# Patient Record
Sex: Male | Born: 1945 | Race: White | Hispanic: No | Smoking: Former smoker
Health system: Southern US, Community
[De-identification: ages and names within clinical notes are randomized; demographics above are authoritative.]

## PROBLEM LIST (undated history)

## (undated) DIAGNOSIS — F32A Depression, unspecified: Secondary | ICD-10-CM

## (undated) DIAGNOSIS — K219 Gastro-esophageal reflux disease without esophagitis: Secondary | ICD-10-CM

## (undated) DIAGNOSIS — G4730 Sleep apnea, unspecified: Secondary | ICD-10-CM

## (undated) DIAGNOSIS — C801 Malignant (primary) neoplasm, unspecified: Secondary | ICD-10-CM

## (undated) DIAGNOSIS — Z889 Allergy status to unspecified drugs, medicaments and biological substances status: Secondary | ICD-10-CM

## (undated) DIAGNOSIS — N189 Chronic kidney disease, unspecified: Secondary | ICD-10-CM

## (undated) DIAGNOSIS — E039 Hypothyroidism, unspecified: Secondary | ICD-10-CM

## (undated) DIAGNOSIS — E119 Type 2 diabetes mellitus without complications: Secondary | ICD-10-CM

## (undated) HISTORY — PX: OTHER SURGICAL HISTORY: SHX169

---

## 2002-02-07 DIAGNOSIS — I4891 Unspecified atrial fibrillation: Secondary | ICD-10-CM

## 2002-02-07 HISTORY — DX: Unspecified atrial fibrillation: I48.91

## 2006-12-06 DIAGNOSIS — K219 Gastro-esophageal reflux disease without esophagitis: Secondary | ICD-10-CM | POA: Diagnosis present

## 2010-07-20 DIAGNOSIS — I10 Essential (primary) hypertension: Secondary | ICD-10-CM

## 2010-07-20 DIAGNOSIS — I4819 Other persistent atrial fibrillation: Secondary | ICD-10-CM | POA: Diagnosis present

## 2010-07-20 DIAGNOSIS — E1122 Type 2 diabetes mellitus with diabetic chronic kidney disease: Secondary | ICD-10-CM | POA: Diagnosis present

## 2010-07-20 HISTORY — DX: Essential (primary) hypertension: I10

## 2010-08-17 DIAGNOSIS — E890 Postprocedural hypothyroidism: Secondary | ICD-10-CM | POA: Diagnosis present

## 2010-10-20 DIAGNOSIS — N400 Enlarged prostate without lower urinary tract symptoms: Secondary | ICD-10-CM | POA: Insufficient documentation

## 2015-12-10 DIAGNOSIS — G4733 Obstructive sleep apnea (adult) (pediatric): Secondary | ICD-10-CM

## 2016-02-09 ENCOUNTER — Other Ambulatory Visit: Payer: Self-pay | Admitting: Urology

## 2016-02-09 DIAGNOSIS — N2889 Other specified disorders of kidney and ureter: Secondary | ICD-10-CM

## 2016-02-11 ENCOUNTER — Ambulatory Visit
Admission: RE | Admit: 2016-02-11 | Discharge: 2016-02-11 | Disposition: A | Discharge status: Home / Self Care | Payer: Medicare Other | Source: Ambulatory Visit | Attending: Urology | Admitting: Urology

## 2016-02-11 DIAGNOSIS — N2889 Other specified disorders of kidney and ureter: Secondary | ICD-10-CM

## 2016-02-11 HISTORY — PX: IR GENERIC HISTORICAL: IMG1180011

## 2016-02-11 NOTE — Consult Note (Addendum)
Chief Complaint: I have a kidney mass.   Referring Physician(s): Dr. Rosario Adie [facility] [phone]  PCP: Dr. Toney Reil, [place], [place], with [facility].   History of Present Illness: Patrick Rocha is a 71 y.o. male presenting today, kindly referred by Dr. Venia Minks of the urology service [facility] regional physicians, for evaluation of cryoablation for left-sided renal tumor.  Patrick Rocha tells me that the tumor was first identified on a CT in 2015, which was performed by his physician for other reasons. He has not had any flank pain or urology symptoms related to the tumor at this time. Interval CT 10/27/2015 again showed similar configuration, and an MRI was then performed at the request of Dr. Venia Minks for better characterization.  MRI performed 02/02/2016 in fact shows 2 lesions which are compatible with renal cell carcinoma. There is a lower pole mass measuring 2.8 cm compatible with RCC. 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  He presents today for discussion, having seen Dr. Venia Minks last in the clinic 01/12/2016. He tells me their discussion involve several options, including partial nephrectomy, biopsy, and also the possibility of our cryoablation as a treatment.  I had a lengthy discussion regarding renal cell carcinoma and staging, as well as the associated treatment options. Each of the small lesions, superior less than 2 cm, inferior less than 3 cm represent stage T1a.  Given the appearance on the MRI, I think treatment is reasonable and biopsy unnecessary. We discussed treating these at a single session, with 2 separate treatment location with CT guidance. This can happen at [facility], I talked about logistics which would include general anesthesia and overnight stay for observation, with typical discharge the  following day. He will need to hold his Xarelto blood thinner for 3 days leading up to the procedure. Regarding risks, I specifically discussed: Bleeding, infection, injury to local region. Injury to adjacent organ such as colon, pancreas, bowel, injury to the abdominal wall with possible nerve injury, injury to collecting system, loss of kidney function including the entire kidney, need for further surgery/procedure, cryo-shock, cardiopulmonary collapse, death.   After our discussion, he would like to proceed with the treatment.  Medical history:  COPD, diabetes, atrial fibrillation/arrhythmia, hyperlipidemia, hypertension, history of kidney stones, sleep apnea   No past surgical history on file.  Cardiac electrophysiology study with ablation for arrhythmia Nasal sinus surgery, tonsillectomy  Allergies: Patient has no known allergies.  Medications: Prior to Admission medications   Not on File    Triamcinolone cream 0.1% Flomax 0.4 mg daily Xarelto 20 mg daily with dinner Protonic 40 mg 2 times daily Omega 3 fatty acids 1000 mg 3 times daily Metformin 1000 mg twice daily with meals Levothyroxin 150 g before breakfast Lasix 20 mg daily Allegra 180 mg 2 times daily Ferrous sulfate 325 mg 2 times daily with meal TriCor 145 mg daily Lexapro 10 mg daily Diltiazem 180 mg 2 times daily Cinnamon capsule 500 mg twice daily Carvedilol 3.125 mg 2 times daily with meal Lipitor 40 mg daily Lotensin 40 mg daily Aspirin 325 mg tablet daily Glipizide  No family history on file.  Social History   Social History  . Marital status: Widowed    Spouse name: N/A  . Number of children: N/A  . Years of education: N/A   Social History Main Topics  . Smoking status:  Not on file  . Smokeless tobacco: Not on file  . Alcohol use Not on file  . Drug use: Unknown  . Sexual activity: Not on file   Other Topics Concern  . Not on file   Social History Narrative  . No narrative on file     ECOG Status: 0 - Asymptomatic  Review of Systems: A 12 point ROS discussed and pertinent positives are indicated in the HPI above.  All other systems are negative.  Review of Systems  Vital Signs: BP 129/64 (BP Location: Left Arm, Patient Position: Sitting, Cuff Size: Large)   Pulse 62   Temp 98.4 F (36.9 C) (Oral)   Resp 15   Ht 5\' 8"  (1.727 m)   Wt 258 lb (117 kg)   SpO2 97%   BMI 39.23 kg/m   Physical Exam  Atraumatic, normocephalic, mucous membrane moist pink. Conjugate gaze. No glasses. No scleral icterus or scleral injection. Moving all 4 extremities equally and grossly with gross motor or gross sensory intact. Alert and oriented to person place and time. Appropriate affect and insightful questions. Symmetric excursion of the chest on inspiration and expiration. No labored breathing. Obese abdomen nontender. Genitourinary deferred. No jaundice with no obvious rash or open wounds.   Mallampati Score:  2  Imaging: No results found.  Labs:  CBC: No results for input(s): WBC, HGB, HCT, PLT in the last 8760 hours.  COAGS: No results for input(s): INR, APTT in the last 8760 hours.  BMP: No results for input(s): NA, K, CL, CO2, GLUCOSE, BUN, CALCIUM, CREATININE, GFRNONAA, GFRAA in the last 8760 hours.  Invalid input(s): CMP  LIVER FUNCTION TESTS: No results for input(s): BILITOT, AST, ALT, ALKPHOS, PROT, ALBUMIN in the last 8760 hours.  TUMOR MARKERS: No results for input(s): AFPTM, CEA, CA199, CHROMGRNA in the last 8760 hours.  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    I think he is a good candidate for CT-guided cryoablation, which we can perform at [facility] at a single session.  I think the excellent imaging findings on recent MRI obviate the need for a preprocedure biopsy, particularly given that he takes Xarelto.  After our discussion  about risks and benefits and potential treatment options, including observation and nephron sparing surgery, he would like to proceed with CT-guided cryoablation. He prefers to have this treated before an upcoming trip in March  We will schedule  him for CT-guided cryoablation at [facility] with general anesthesia. He will need to hold his Xarelto 3 days before treatment.    ADDENDUM: 4:56pm  I had a chance to speak to Dr. Venia Minks about the care of Patrick Rocha.  I agree that it would be best if Patrick. Rocha returns for follow-up appointment with his Urologist to complete a discussion about possible treatment options.  For clarification, I agree with Dr. Venia Minks that a simultaneous biopsy of each lesion is best at the time of any percutaneous ablation, should that be the treatment of choice.  Thank you for this interesting consult.  I greatly enjoyed meeting Patrick Rocha and look forward to participating in their care.  A copy of this report was sent to the requesting provider on this date.  Electronically Signed: Corrie Mckusick 02/11/2016, 3:31 PM   I spent a total of  40 Minutes   in face to face in clinical consultation, greater than 50%  of which was counseling/coordinating care for  left-sided multifocal renal cell carcinoma, possible cryoablation.

## 2016-02-12 ENCOUNTER — Encounter: Payer: Self-pay | Admitting: Interventional Radiology

## 2016-03-18 ENCOUNTER — Other Ambulatory Visit: Payer: Self-pay | Admitting: Radiology

## 2016-03-18 DIAGNOSIS — N2889 Other specified disorders of kidney and ureter: Secondary | ICD-10-CM

## 2016-04-12 ENCOUNTER — Ambulatory Visit
Admission: RE | Admit: 2016-04-12 | Discharge: 2016-04-12 | Disposition: A | Discharge status: Home / Self Care | Payer: Medicare Other | Source: Ambulatory Visit | Attending: Radiology | Admitting: Radiology

## 2016-04-12 DIAGNOSIS — N2889 Other specified disorders of kidney and ureter: Secondary | ICD-10-CM

## 2016-04-12 HISTORY — PX: IR RADIOLOGIST EVAL & MGMT: IMG5224

## 2016-04-12 NOTE — Progress Notes (Signed)
Chief Complaint: Left RCC  Referring Physician(s): Dr. Rosario Adie [facility] [phone]  PCP: Dr. Toney Reil, [place], [place], with [facility].   History of Present Illness: Patrick Rocha is a 71 y.o. male presenting today as a scheduled follow up, SP CT guided cryoablation of left sided renal tumors (2 separate tumors), both with imaging characteristics compatible with RCC.   CT guided cryoablation of both lesions was performed 03/17/2016 at [facility].  Intra-operative biopsy confirmed RCC at both sites that were treated.   He was admitted one night for observation and was discharged home on 03/18/2016.    He has recovered well.  He denies any hematuria, pain, fever, rigors, or chills.  He states that he has some bruising and soreness but that has resolved.    No past medical history on file.  Past Surgical History:  Procedure Laterality Date  . IR GENERIC HISTORICAL  02/11/2016   IR RADIOLOGIST EVAL & MGMT 02/11/2016 Corrie Mckusick, DO [facility] INTERV RAD    Allergies: Patient has no known allergies.  Medications: Prior to Admission medications   Medication Sig Start Date End Date Taking? Authorizing Provider  aspirin 325 MG tablet Take 325 mg by mouth daily.   Yes Historical Provider, MD  atorvastatin (LIPITOR) 40 MG tablet Take 40 mg by mouth daily.   Yes Historical Provider, MD  benazepril (LOTENSIN) 40 MG tablet Take 20 mg by mouth daily.    Yes Historical Provider, MD  carvedilol (COREG) 3.125 MG tablet Take 3.125 mg by mouth 2 (two) times daily with a meal.   Yes Historical Provider, MD  Cinnamon 500 MG capsule Take 500 mg by mouth daily. Takes 2 tablet po daily   Yes Historical Provider, MD  diltiazem (DILTIAZEM CD) 180 MG 24 hr capsule Take 180 mg by mouth 2 (two) times daily.   Yes Historical Provider, MD  escitalopram (LEXAPRO) 10 MG tablet Take 10 mg by mouth daily.   Yes Historical Provider, MD    fenofibrate (TRICOR) 145 MG tablet Take 145 mg by mouth daily.   Yes Historical Provider, MD  ferrous sulfate 325 (65 FE) MG tablet Take 325 mg by mouth 2 (two) times daily with a meal.   Yes Historical Provider, MD  fexofenadine (ALLEGRA) 180 MG tablet Take 180 mg by mouth 2 (two) times daily.   Yes Historical Provider, MD  furosemide (LASIX) 20 MG tablet Take 20 mg by mouth daily.   Yes Historical Provider, MD  levothyroxine (SYNTHROID, LEVOTHROID) 150 MCG tablet Take 150 mcg by mouth daily before breakfast.   Yes Historical Provider, MD  metFORMIN (GLUCOPHAGE) 1000 MG tablet Take 1,000 mg by mouth 2 (two) times daily with a meal.   Yes Historical Provider, MD  Omega-3 Fatty Acids (FISH OIL) 1000 MG CAPS Take 1,000 mg by mouth 3 (three) times daily.   Yes Historical Provider, MD  pantoprazole (PROTONIX) 40 MG tablet Take 40 mg by mouth 2 (two) times daily.   Yes Historical Provider, MD  pioglitazone (ACTOS) 15 MG tablet Take 15 mg by mouth daily.   Yes Historical Provider, MD  rivaroxaban (XARELTO) 20 MG TABS tablet Take 20 mg by mouth daily with supper.   Yes Historical Provider, MD  tamsulosin (FLOMAX) 0.4 MG CAPS capsule Take 0.4 mg by mouth daily.   Yes Historical Provider, MD  triamcinolone cream (KENALOG) 0.1 % Apply 1 application topically daily.   Yes Historical Provider, MD  No family history on file.  Social History   Social History  . Marital status: Widowed    Spouse name: N/A  . Number of children: N/A  . Years of education: N/A   Social History Main Topics  . Smoking status: Not on file  . Smokeless tobacco: Not on file  . Alcohol use Not on file  . Drug use: Unknown  . Sexual activity: Not on file   Other Topics Concern  . Not on file   Social History Narrative  . No narrative on file     Review of Systems: A 12 point ROS discussed and pertinent positives are indicated in the HPI above.  All other systems are negative.  Review of Systems  Vital  Signs: BP (!) 139/59 (BP Location: Left Arm, Patient Position: Sitting, Cuff Size: Large)   Pulse (!) 54   Temp 98.1 F (36.7 C) (Oral)   Resp 16   Ht 5\' 9"  (1.753 m)   Wt 260 lb (117.9 kg)   SpO2 98%   BMI 38.40 kg/m   Physical Exam  Targeted physical exam shows well healed incisions sites at the site of probe placement.   No evidence of infection.   Mallampati Score:     Imaging: No results found.  Labs:  CBC: No results for input(s): WBC, HGB, HCT, PLT in the last 8760 hours.  COAGS: No results for input(s): INR, APTT in the last 8760 hours.  BMP: No results for input(s): NA, K, CL, CO2, GLUCOSE, BUN, CALCIUM, CREATININE, GFRNONAA, GFRAA in the last 8760 hours.  Invalid input(s): CMP  LIVER FUNCTION TESTS: No results for input(s): BILITOT, AST, ALT, ALKPHOS, PROT, ALBUMIN in the last 8760 hours.  TUMOR MARKERS: No results for input(s): AFPTM, CEA, CA199, CHROMGRNA in the last 8760 hours.  Assessment and Plan:  Patrick Rocha is 71 year old male status post CT guided cryoablation of 2 separate renal tumors, both of which were confirmed RCC on intra-operative biopsy.    He is doing well 1 month after the procedure, with no concerns.    We discussed our follow up plan, which includes C+ CT of abd/pelvis 3 months after the procedure, which is approximately 06/14/2016, and also continued urology care.    Plan: - Contrast CT 3 months after the ablation. - observe all of his follow up appointments with Urology  Electronically Signed: Corrie Mckusick 04/12/2016, 5:24 PM   I spent a total of    15 Minutes in face to face in clinical consultation, greater than 50% of which was counseling/coordinating care for CT guided cryoablation of 2 RCC left kidney.

## 2016-07-14 ENCOUNTER — Other Ambulatory Visit: Payer: Self-pay | Admitting: *Deleted

## 2016-07-18 ENCOUNTER — Other Ambulatory Visit: Payer: Self-pay | Admitting: *Deleted

## 2016-07-18 DIAGNOSIS — N2889 Other specified disorders of kidney and ureter: Secondary | ICD-10-CM

## 2016-07-19 ENCOUNTER — Other Ambulatory Visit: Payer: Self-pay | Admitting: *Deleted

## 2016-07-19 ENCOUNTER — Other Ambulatory Visit: Payer: Self-pay | Admitting: Interventional Radiology

## 2016-07-19 DIAGNOSIS — N2889 Other specified disorders of kidney and ureter: Secondary | ICD-10-CM

## 2016-07-25 ENCOUNTER — Encounter: Payer: Self-pay | Admitting: Interventional Radiology

## 2016-08-01 ENCOUNTER — Ambulatory Visit (HOSPITAL_COMMUNITY)
Admission: RE | Admit: 2016-08-01 | Discharge: 2016-08-01 | Disposition: A | Discharge status: Home / Self Care | Payer: Medicare Other | Source: Ambulatory Visit | Attending: Interventional Radiology | Admitting: Interventional Radiology

## 2016-08-01 ENCOUNTER — Other Ambulatory Visit: Payer: Medicare Other

## 2016-08-01 ENCOUNTER — Encounter (HOSPITAL_COMMUNITY): Payer: Self-pay

## 2016-08-01 DIAGNOSIS — Z9889 Other specified postprocedural states: Secondary | ICD-10-CM | POA: Diagnosis not present

## 2016-08-01 DIAGNOSIS — K8020 Calculus of gallbladder without cholecystitis without obstruction: Secondary | ICD-10-CM | POA: Insufficient documentation

## 2016-08-01 DIAGNOSIS — I2510 Atherosclerotic heart disease of native coronary artery without angina pectoris: Secondary | ICD-10-CM | POA: Diagnosis not present

## 2016-08-01 DIAGNOSIS — K760 Fatty (change of) liver, not elsewhere classified: Secondary | ICD-10-CM | POA: Diagnosis not present

## 2016-08-01 DIAGNOSIS — N2889 Other specified disorders of kidney and ureter: Secondary | ICD-10-CM | POA: Insufficient documentation

## 2016-08-01 DIAGNOSIS — I700 Atherosclerosis of aorta: Secondary | ICD-10-CM | POA: Diagnosis not present

## 2016-08-01 LAB — POCT I-STAT CREATININE: Creatinine, Ser: 1.6 mg/dL — ABNORMAL HIGH (ref 0.61–1.24)

## 2016-08-01 MED ORDER — IOPAMIDOL (ISOVUE-300) INJECTION 61%
100.0000 mL | Freq: Once | INTRAVENOUS | Status: AC | PRN
  Administered 2016-08-01: 80 mL via INTRAVENOUS

## 2016-08-01 MED ORDER — IOPAMIDOL (ISOVUE-300) INJECTION 61%
INTRAVENOUS | Status: AC
  Filled 2016-08-01: qty 100

## 2016-08-03 ENCOUNTER — Ambulatory Visit
Admission: RE | Admit: 2016-08-03 | Discharge: 2016-08-03 | Disposition: A | Discharge status: Home / Self Care | Payer: Medicare Other | Source: Ambulatory Visit | Attending: Interventional Radiology | Admitting: Interventional Radiology

## 2016-08-03 ENCOUNTER — Other Ambulatory Visit (HOSPITAL_COMMUNITY): Payer: Medicare Other

## 2016-08-03 DIAGNOSIS — N2889 Other specified disorders of kidney and ureter: Secondary | ICD-10-CM

## 2016-08-03 NOTE — Progress Notes (Signed)
Chief Complaint: Left RCC, status post treatment  Referring Physician(s): Dr. Rosario Adie [facility] [phone]   PCP: Dr. Toney Reil, [place], [place], with [facility].  History of Present Illness: Patrick Rocha is a 71 y.o. male presenting today as a scheduled follow up visit, status post CT guided cryoablation of 2 left-sided RCC 03/17/2016 at [facility].  Intraoperative biopsy performed confirmed RCC at both sites.   He has done well in recovery.  He denies any pain, urinary symptoms, fever.  He has returned to all of his usual activities.    We had a quick visit today in order to go over his imaging results, and to discuss our plan in the next year.    The contrast CT performed 08/01/2016 shows typical changes after therapy, with no concern for recurrence/progression.    He tells me that he will be establishing new Urology care, and he continues to see Dr. Kirk Ruths.  He is relocating to [place].    No past medical history on file.  Past Surgical History:  Procedure Laterality Date  . IR GENERIC HISTORICAL  02/11/2016   IR RADIOLOGIST EVAL & MGMT 02/11/2016 Corrie Mckusick, DO [facility] INTERV RAD  . IR RADIOLOGIST EVAL & MGMT  04/12/2016    Allergies: Patient has no known allergies.  Medications: Prior to Admission medications   Medication Sig Start Date End Date Taking? Authorizing Provider  aspirin 325 MG tablet Take 325 mg by mouth daily.   Yes [provider]  atorvastatin (LIPITOR) 40 MG tablet Take 40 mg by mouth daily.   Yes [provider]  benazepril (LOTENSIN) 40 MG tablet Take 20 mg by mouth daily.    Yes [provider]  carvedilol (COREG) 3.125 MG tablet Take 3.125 mg by mouth 2 (two) times daily with a meal.   Yes [provider]  Cinnamon 500 MG capsule Take 500 mg by mouth daily. Takes 2 tablet po daily   Yes [provider]  diltiazem (DILTIAZEM CD) 180 MG 24  hr capsule Take 180 mg by mouth 2 (two) times daily.   Yes [provider]  escitalopram (LEXAPRO) 10 MG tablet Take 10 mg by mouth daily.   Yes [provider]  fenofibrate (TRICOR) 145 MG tablet Take 145 mg by mouth daily.   Yes [provider]  ferrous sulfate 325 (65 FE) MG tablet Take 325 mg by mouth 2 (two) times daily with a meal.   Yes [provider]  fexofenadine (ALLEGRA) 180 MG tablet Take 180 mg by mouth 2 (two) times daily.   Yes [provider]  furosemide (LASIX) 20 MG tablet Take 20 mg by mouth daily.   Yes [provider]  levothyroxine (SYNTHROID, LEVOTHROID) 150 MCG tablet Take 150 mcg by mouth daily before breakfast.   Yes [provider]  metFORMIN (GLUCOPHAGE) 1000 MG tablet Take 1,000 mg by mouth 2 (two) times daily with a meal.   Yes [provider]  Omega-3 Fatty Acids (FISH OIL) 1000 MG CAPS Take 1,000 mg by mouth 3 (three) times daily.   Yes [provider]  pantoprazole (PROTONIX) 40 MG tablet Take 40 mg by mouth 2 (two) times daily.   Yes [provider]  pioglitazone (ACTOS) 15 MG tablet Take 15 mg by mouth daily.   Yes [provider]  rivaroxaban (XARELTO) 20 MG TABS tablet Take 20 mg by mouth daily with supper.   Yes [provider]  tamsulosin (FLOMAX) 0.4 MG CAPS capsule Take 0.4 mg by mouth daily.   Yes [provider]  triamcinolone cream (KENALOG) 0.1 % Apply 1 application topically daily.   Yes [provider]     No family history on file.  Social History   Social History  . Marital status: Widowed    Spouse name: N/A  . Number of children: N/A  . Years of education: N/A   Social History Main Topics  . Smoking status: Not on file  . Smokeless tobacco: Not on file  . Alcohol use Not on file  . Drug use: Unknown  . Sexual activity: Not on file   Other Topics Concern  . Not on file   Social History Narrative  . No  narrative on file    Review of Systems: A 12 point ROS discussed and pertinent positives are indicated in the HPI above.  All other systems are negative.  Review of Systems  Vital Signs: BP (!) 153/72   Pulse (!) 57   Temp 97.5 F (36.4 C) (Oral)   Resp 16   Ht 5\' 9"  (1.753 m)   Wt 260 lb (117.9 kg)   SpO2 98%   BMI 38.40 kg/m   Physical Exam  Mallampati Score:     Imaging: Ct Abdomen Pelvis W Wo Contrast  Result Date: 08/01/2016 CLINICAL DATA:  71 year old male with history of left renal cancer status post cryoablation 6 months ago. Followup study. EXAM: CT ABDOMEN AND PELVIS WITHOUT AND WITH CONTRAST TECHNIQUE: Multidetector CT imaging of the abdomen and pelvis was performed following the standard protocol before and following the bolus administration of intravenous contrast. CONTRAST:  33mL ISOVUE-300 IOPAMIDOL (ISOVUE-300) INJECTION 61% COMPARISON:  No priors. FINDINGS: Lower chest: Atherosclerotic calcification in the left anterior descending coronary artery. Hepatobiliary: Mild diffuse low attenuation throughout the hepatic parenchyma, compatible with hepatic steatosis. No cystic or solid hepatic lesions. No intra or extrahepatic biliary ductal dilatation. Tiny calcified gallstones lying dependently in the neck of the gallbladder. No findings to suggest an acute cholecystitis at this time. Pancreas: No pancreatic mass. No pancreatic ductal dilatation. No pancreatic or peripancreatic fluid or inflammatory changes. Spleen: Subcentimeter low-attenuation lesion in the posterior aspect of the spleen is incompletely characterized, but has a generally benign appearance. Adrenals/Urinary Tract: In the lateral aspect of the interpolar region and lateral aspect of the upper pole of the left kidney there are 2 areas which have an appearance compatible with prior cryoablation. Both of these lesions have a central soft tissue nidus, which does not demonstrate arterial phase hyperenhancement,  surrounded by fatty attenuation within a thin surrounding soft tissue attenuation margin. Today's study will serve as a baseline for future followup examinations. There multiple other renal lesions bilaterally, most of which are subcentimeter in size and too small to definitively characterize. Simple cysts are present measuring up to 1.7 cm in the interpolar region of the right kidney. Additionally, in the medial aspect of the interpolar region of the right kidney there is a partially exophytic 1.3 cm lesion which is high attenuation and does not enhance (46 HU on precontrast images, 48 HU on arterial phase images, 51 HU on portal venous phase and 50 HU on delayed phase images), compatible with a proteinaceous/hemorrhagic cyst. Bilateral adrenal glands are normal in appearance. No hydroureteronephrosis. Urinary bladder is normal in appearance. Stomach/Bowel: Normal appearance of the stomach. No pathologic dilatation of small bowel or colon. Normal appendix. Vascular/Lymphatic: Aortic atherosclerosis, without evidence of aneurysm  or dissection in the abdominal or pelvic vasculature. Single renal arteries bilaterally are widely patent. Both renal veins are patent. No lymphadenopathy noted in the abdomen or pelvis. Reproductive: Prostate gland seminal vesicles are unremarkable in appearance. Other: No significant volume of ascites.  No pneumoperitoneum. Musculoskeletal: There are no aggressive appearing lytic or blastic lesions noted in the visualized portions of the skeleton. IMPRESSION: 1. Expected postprocedural changes of cryoablation to the right kidney, without definite evidence to suggest residual/recurrent disease or metastatic disease in the abdomen or pelvis. 2. Hepatic steatosis. 3. Aortic atherosclerosis, in addition to at least left anterior descending coronary artery disease. Please note that although the presence of coronary artery calcium documents the presence of coronary artery disease, the severity  of this disease and any potential stenosis cannot be assessed on this non-gated CT examination. Assessment for potential risk factor modification, dietary therapy or pharmacologic therapy may be warranted, if clinically indicated. 4. Cholelithiasis without evidence of acute cholecystitis at this time. Electronically Signed   By: Vinnie Langton M.D.   On: 08/01/2016 10:33    Labs:  CBC: No results for input(s): WBC, HGB, HCT, PLT in the last 8760 hours.  COAGS: No results for input(s): INR, APTT in the last 8760 hours.  BMP:  Recent Labs  08/01/16 0847  CREATININE 1.60*    LIVER FUNCTION TESTS: No results for input(s): BILITOT, AST, ALT, ALKPHOS, PROT, ALBUMIN in the last 8760 hours.  TUMOR MARKERS: No results for input(s): AFPTM, CEA, CA199, CHROMGRNA in the last 8760 hours.  Assessment and Plan:  Patrick Rocha is 71 year old male status post CT guided cryoablation of 2 separate renal tumors, both of which were confirmed RCC on intra-operative biopsy.    He has returned to full activities now, 4 months after the procedure, with no concerns.    We discussed our follow up plan, which includes surveillance contrast CT of abd/pelvis several times during the first year after the procedure, typically every 3-4 months.  We anticipate his next appointment and CT in October.   Plan: - Contrast CT and office visit 4 months from now, approximately October.  - observe all of his follow up appointments with his physicians.      Electronically Signed: Corrie Mckusick 08/03/2016, 1:28 PM   I spent a total of    25 Minutes in face to face in clinical consultation, greater than 50% of which was counseling/coordinating care for left sided RCC, status post cryoablation

## 2016-10-31 ENCOUNTER — Ambulatory Visit (HOSPITAL_COMMUNITY): Payer: Medicare Other

## 2016-10-31 ENCOUNTER — Other Ambulatory Visit: Payer: Self-pay | Admitting: Interventional Radiology

## 2016-10-31 DIAGNOSIS — N2889 Other specified disorders of kidney and ureter: Secondary | ICD-10-CM

## 2016-10-31 DIAGNOSIS — C642 Malignant neoplasm of left kidney, except renal pelvis: Secondary | ICD-10-CM

## 2016-11-01 ENCOUNTER — Other Ambulatory Visit: Payer: Self-pay | Admitting: Interventional Radiology

## 2016-11-01 DIAGNOSIS — N2889 Other specified disorders of kidney and ureter: Secondary | ICD-10-CM

## 2016-12-07 ENCOUNTER — Ambulatory Visit
Admission: RE | Admit: 2016-12-07 | Discharge: 2016-12-07 | Disposition: A | Discharge status: Home / Self Care | Payer: Medicare Other | Source: Ambulatory Visit | Attending: Interventional Radiology | Admitting: Interventional Radiology

## 2016-12-07 ENCOUNTER — Ambulatory Visit (HOSPITAL_COMMUNITY)
Admission: RE | Admit: 2016-12-07 | Discharge: 2016-12-07 | Disposition: A | Discharge status: Home / Self Care | Payer: Medicare Other | Source: Ambulatory Visit | Attending: Interventional Radiology | Admitting: Interventional Radiology

## 2016-12-07 DIAGNOSIS — C642 Malignant neoplasm of left kidney, except renal pelvis: Secondary | ICD-10-CM | POA: Insufficient documentation

## 2016-12-07 DIAGNOSIS — N2889 Other specified disorders of kidney and ureter: Secondary | ICD-10-CM

## 2016-12-07 DIAGNOSIS — K4090 Unilateral inguinal hernia, without obstruction or gangrene, not specified as recurrent: Secondary | ICD-10-CM | POA: Diagnosis not present

## 2016-12-07 DIAGNOSIS — K8020 Calculus of gallbladder without cholecystitis without obstruction: Secondary | ICD-10-CM | POA: Insufficient documentation

## 2016-12-07 DIAGNOSIS — I700 Atherosclerosis of aorta: Secondary | ICD-10-CM | POA: Diagnosis not present

## 2016-12-07 HISTORY — PX: IR RADIOLOGIST EVAL & MGMT: IMG5224

## 2016-12-07 LAB — POCT I-STAT CREATININE: CREATININE: 1.6 mg/dL — AB (ref 0.61–1.24)

## 2016-12-07 MED ORDER — IOPAMIDOL (ISOVUE-300) INJECTION 61%
INTRAVENOUS | Status: AC
  Filled 2016-12-07: qty 100

## 2016-12-07 MED ORDER — IOPAMIDOL (ISOVUE-300) INJECTION 61%
80.0000 mL | Freq: Once | INTRAVENOUS | Status: AC | PRN
  Administered 2016-12-07: 80 mL via INTRAVENOUS

## 2016-12-07 NOTE — Progress Notes (Signed)
Chief Complaint: Renal Cell Cancer  Referring Physician(s): Lance Sell  Supervising Physician: Corrie Mckusick  History of Present Illness: Patrick Rocha is a 71 y.o. male who is here today for follow up.  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.    Intraoperative biopsy performed confirmed RCC at both sites.   CT performed 08/01/2016 shows typical changes after therapy, with no concern for recurrence/progression.    He feels well. He has no complaints.  No past medical history on file.  Past Surgical History:  Procedure Laterality Date  . IR GENERIC HISTORICAL  02/11/2016   IR RADIOLOGIST EVAL & MGMT 02/11/2016 Corrie Mckusick, DO [facility] INTERV RAD  . IR RADIOLOGIST EVAL & MGMT  04/12/2016    Allergies: Patient has no known allergies.  Medications: Prior to Admission medications   Medication Sig Start Date End Date Taking? Authorizing Provider  aspirin 325 MG tablet Take 325 mg by mouth daily.    [provider]  atorvastatin (LIPITOR) 40 MG tablet Take 40 mg by mouth daily.    [provider]  benazepril (LOTENSIN) 40 MG tablet Take 20 mg by mouth daily.     [provider]  carvedilol (COREG) 3.125 MG tablet Take 3.125 mg by mouth 2 (two) times daily with a meal.    [provider]  Cinnamon 500 MG capsule Take 500 mg by mouth daily. Takes 2 tablet po daily    [provider]  diltiazem (DILTIAZEM CD) 180 MG 24 hr capsule Take 180 mg by mouth 2 (two) times daily.    [provider]  escitalopram (LEXAPRO) 10 MG tablet Take 10 mg by mouth daily.    [provider]  fenofibrate (TRICOR) 145 MG tablet Take 145 mg by mouth daily.    [provider]  ferrous sulfate 325 (65 FE) MG tablet Take 325 mg by mouth 2 (two) times daily with a meal.    [provider]  fexofenadine (ALLEGRA) 180 MG tablet Take 180 mg by mouth 2 (two)  times daily.    [provider]  furosemide (LASIX) 20 MG tablet Take 20 mg by mouth daily.    [provider]  levothyroxine (SYNTHROID, LEVOTHROID) 150 MCG tablet Take 150 mcg by mouth daily before breakfast.    [provider]  metFORMIN (GLUCOPHAGE) 1000 MG tablet Take 1,000 mg by mouth 2 (two) times daily with a meal.    [provider]  Omega-3 Fatty Acids (FISH OIL) 1000 MG CAPS Take 1,000 mg by mouth 3 (three) times daily.    [provider]  pantoprazole (PROTONIX) 40 MG tablet Take 40 mg by mouth 2 (two) times daily.    [provider]  pioglitazone (ACTOS) 15 MG tablet Take 15 mg by mouth daily.    [provider]  rivaroxaban (XARELTO) 20 MG TABS tablet Take 20 mg by mouth daily with supper.    [provider]  tamsulosin (FLOMAX) 0.4 MG CAPS capsule Take 0.4 mg by mouth daily.    [provider]  triamcinolone cream (KENALOG) 0.1 % Apply 1 application topically daily.    [provider]     No family history on file.  Social History   Social History  . Marital status: Widowed    Spouse name: N/A  . Number of children: N/A  . Years of education: N/A   Social History Main Topics  .  Smoking status: Not on file  . Smokeless tobacco: Not on file  . Alcohol use Not on file  . Drug use: Unknown  . Sexual activity: Not on file   Other Topics Concern  . Not on file   Social History Narrative  . No narrative on file    Review of Systems: A 12 point ROS discussed Review of Systems  Constitutional: Negative.   HENT: Negative.   Respiratory: Negative.   Cardiovascular: Negative.   Gastrointestinal: Negative.   Genitourinary: Negative.   Musculoskeletal: Negative.   Skin: Negative.   Neurological: Negative.   Hematological: Negative.   Psychiatric/Behavioral: Negative.     Vital Signs: There were no vitals taken for this visit.  Physical Exam  Constitutional: He is oriented  to person, place, and time. He appears well-developed.  HENT:  Head: Normocephalic and atraumatic.  Eyes: EOM are normal.  Neck: Normal range of motion.  Cardiovascular: Normal rate, regular rhythm and normal heart sounds.   Pulmonary/Chest: Effort normal and breath sounds normal.  Abdominal: Soft. He exhibits no distension. There is no tenderness.  protuberant  Musculoskeletal: Normal range of motion.  Neurological: He is alert and oriented to person, place, and time.  Skin: Skin is warm and dry.  Psychiatric: He has a normal mood and affect. His behavior is normal. Judgment and thought content normal.    Imaging: No results found.  Labs:  CBC: No results for input(s): WBC, HGB, HCT, PLT in the last 8760 hours.  COAGS: No results for input(s): INR, APTT in the last 8760 hours.  BMP:  Recent Labs  08/01/16 0847 12/07/16 1212  CREATININE 1.60* 1.60*    LIVER FUNCTION TESTS: No results for input(s): BILITOT, AST, ALT, ALKPHOS, PROT, ALBUMIN in the last 8760 hours.  TUMOR MARKERS: No results for input(s): AFPTM, CEA, CA199, CHROMGRNA in the last 8760 hours.  Assessment:  Status post CT guided cryoablation of 2 separate renal tumors, both of which were confirmed RCC on intra-operative biopsy.  Creatinine remains stable at 1.6  Will obtain surveillance contrast CT of abd/pelvis in 3-4 months.  Electronically Signed: Murrell Redden PA-C 12/07/2016, 1:07 PM   Please refer to Dr. Pasty Arch attestation of this note for management and plan.

## 2016-12-28 ENCOUNTER — Encounter: Payer: Self-pay | Admitting: Interventional Radiology

## 2017-05-29 ENCOUNTER — Other Ambulatory Visit: Payer: Self-pay | Admitting: Radiology

## 2017-05-29 ENCOUNTER — Other Ambulatory Visit: Payer: Self-pay | Admitting: Interventional Radiology

## 2017-05-29 DIAGNOSIS — N2889 Other specified disorders of kidney and ureter: Secondary | ICD-10-CM

## 2017-06-06 ENCOUNTER — Ambulatory Visit
Admission: RE | Admit: 2017-06-06 | Discharge: 2017-06-06 | Disposition: A | Discharge status: Home / Self Care | Payer: Medicare Other | Source: Ambulatory Visit | Attending: Interventional Radiology | Admitting: Interventional Radiology

## 2017-06-06 ENCOUNTER — Encounter (HOSPITAL_COMMUNITY): Payer: Self-pay

## 2017-06-06 ENCOUNTER — Ambulatory Visit (HOSPITAL_COMMUNITY)
Admission: RE | Admit: 2017-06-06 | Discharge: 2017-06-06 | Disposition: A | Discharge status: Home / Self Care | Payer: Medicare Other | Source: Ambulatory Visit | Attending: Interventional Radiology | Admitting: Interventional Radiology

## 2017-06-06 DIAGNOSIS — I2510 Atherosclerotic heart disease of native coronary artery without angina pectoris: Secondary | ICD-10-CM | POA: Insufficient documentation

## 2017-06-06 DIAGNOSIS — N2889 Other specified disorders of kidney and ureter: Secondary | ICD-10-CM | POA: Diagnosis not present

## 2017-06-06 DIAGNOSIS — K8020 Calculus of gallbladder without cholecystitis without obstruction: Secondary | ICD-10-CM | POA: Insufficient documentation

## 2017-06-06 DIAGNOSIS — I700 Atherosclerosis of aorta: Secondary | ICD-10-CM | POA: Diagnosis not present

## 2017-06-06 HISTORY — DX: Type 2 diabetes mellitus without complications: E11.9

## 2017-06-06 HISTORY — DX: Malignant (primary) neoplasm, unspecified: C80.1

## 2017-06-06 HISTORY — PX: IR RADIOLOGIST EVAL & MGMT: IMG5224

## 2017-06-06 LAB — POCT I-STAT CREATININE: CREATININE: 1.4 mg/dL — AB (ref 0.61–1.24)

## 2017-06-06 MED ORDER — IOHEXOL 300 MG/ML  SOLN
100.0000 mL | Freq: Once | INTRAMUSCULAR | Status: AC | PRN
  Administered 2017-06-06: 100 mL via INTRAVENOUS

## 2017-06-06 NOTE — Progress Notes (Signed)
Chief Complaint: Left RCC  Referring Physician(s): PCP is Dr. Toney Reil  History of Present Illness: Patrick Rocha is a 72 y.o. male presenting as a scheduled follow up SP CT guided tissue ablation of 2 separate left RCC, pathology proven.   [name] was treated February 2018 with CT guided ablation of 2 left renal tumors.    He has done very well.  Today he denies any new symptoms, hematuria, flank pain.    Surveillance CT's have been performed through today.  Again, today's study shows treatment defects with no evidence of local recurrence.    Past Medical History:  Diagnosis Date  . Diabetes mellitus without complication ([place])   . renal ca    cryoablation left kidney 02/2016      Allergies: Patient has no known allergies.  Medications: Prior to Admission medications   Medication Sig Start Date End Date Taking? Authorizing Provider  aspirin 325 MG tablet Take 325 mg by mouth daily.   Yes [provider]  atorvastatin (LIPITOR) 40 MG tablet Take 40 mg by mouth daily.   Yes [provider]  benazepril (LOTENSIN) 40 MG tablet Take 20 mg by mouth daily.    Yes [provider]  carvedilol (COREG) 3.125 MG tablet Take 3.125 mg by mouth 2 (two) times daily with a meal.   Yes [provider]  Cinnamon 500 MG capsule Take 500 mg by mouth daily. Takes 2 tablet po daily   Yes [provider]  diltiazem (DILTIAZEM CD) 180 MG 24 hr capsule Take 180 mg by mouth 2 (two) times daily.   Yes [provider]  escitalopram (LEXAPRO) 10 MG tablet Take 10 mg by mouth daily.   Yes [provider]  fenofibrate (TRICOR) 145 MG tablet Take 145 mg by mouth daily.   Yes [provider]  ferrous sulfate 325 (65 FE) MG tablet Take 325 mg by mouth 2 (two) times daily with a meal.   Yes [provider]  fexofenadine (ALLEGRA) 180 MG tablet Take 180 mg by mouth 2 (two) times daily.   Yes [provider]  furosemide (LASIX) 20 MG tablet Take 20 mg by mouth daily.   Yes [provider]  glipiZIDE (GLUCOTROL) 5 MG tablet Take 5 mg by mouth daily before breakfast.   Yes [provider]  levothyroxine (SYNTHROID, LEVOTHROID) 150 MCG tablet Take 150 mcg by mouth daily before breakfast.   Yes [provider]  metFORMIN (GLUCOPHAGE) 1000 MG tablet Take 1,000 mg by mouth 2 (two) times daily with a meal.   Yes [provider]  Omega-3 Fatty Acids (FISH OIL) 1000 MG CAPS Take 1,000 mg by mouth 3 (three) times daily.   Yes [provider]  pantoprazole (PROTONIX) 40 MG tablet Take 40 mg by mouth 2 (two) times daily.   Yes [provider]  pioglitazone (ACTOS) 15 MG tablet Take 15 mg by mouth daily.   Yes [provider]  rivaroxaban (XARELTO) 20 MG TABS tablet Take 20 mg by mouth daily with supper.   Yes [provider]  tamsulosin (FLOMAX) 0.4 MG CAPS capsule Take 0.4 mg by mouth daily.   Yes [provider]  triamcinolone cream (KENALOG) 0.1 % Apply 1 application topically daily.   Yes [provider]     No family history on file.  Social History   Socioeconomic History  . Marital status: Widowed    Spouse name: Not on file  .  Number of children: Not on file  . Years of education: Not on file  . Highest education level: Not on file  Occupational History  . Not on file  Social Needs  . Financial resource strain: Not on file  . Food insecurity:    Worry: Not on file    Inability: Not on file  . Transportation needs:    Medical: Not on file    Non-medical: Not on file  Tobacco Use  . Smoking status: Not on file  Substance and Sexual Activity  . Alcohol use: Not on file  . Drug use: Not on file  . Sexual activity: Not on file  Lifestyle  . Physical activity:    Days per week: Not on file    Minutes per session: Not on file  . Stress: Not on file  Relationships  . Social  connections:    Talks on phone: Not on file    Gets together: Not on file    Attends religious service: Not on file    Active member of club or organization: Not on file    Attends meetings of clubs or organizations: Not on file    Relationship status: Not on file  Other Topics Concern  . Not on file  Social History Narrative  . Not on file    Review of Systems: A 12 point ROS discussed and pertinent positives are indicated in the HPI above.  All other systems are negative.  Review of Systems  Vital Signs: BP (!) 150/71   Pulse 64   Temp 98.1 F (36.7 C) (Oral)   Resp 16   Ht 5\' 10"  (1.778 m)   Wt 260 lb (117.9 kg)   SpO2 97%   BMI 37.31 kg/m   Physical Exam  Mallampati Score:     Imaging: Ct Abdomen W Wo Contrast  Result Date: 06/06/2017 CLINICAL DATA:  Follow-up two left renal masses status post cryoablation February 2018. EXAM: CT ABDOMEN WITHOUT AND WITH CONTRAST TECHNIQUE: Multidetector CT imaging of the abdomen was performed following the standard protocol before and following the bolus administration of intravenous contrast. CONTRAST:  145mL OMNIPAQUE IOHEXOL 300 MG/ML  SOLN COMPARISON:  12/07/2016 CT abdomen. FINDINGS: Lower chest: No significant pulmonary nodules or acute consolidative airspace disease. Coronary atherosclerosis. Hepatobiliary: Normal liver with no liver mass. Cholelithiasis. No biliary ductal dilatation. Pancreas: Normal, with no mass or duct dilation. Spleen: Normal size spleen. 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 No additional splenic lesions. Adrenals/Urinary Tract: Normal adrenals. No renal stones. No hydronephrosis. Exophytic 1.5 cm renal cortical lesion in the medial anterior right interpolar kidney (series 16/image 25) demonstrates precontrast hyperdensity with no  convincing enhancement, mildly increased from 1.2 cm on 12/07/2016 CT, compatible with a Bosniak category 2 hemorrhagic/proteinaceous renal cyst. Simple 2.2 cm partially exophytic renal cyst in the posterior upper right kidney. Several additional subcentimeter hypodense renal cortical lesions scattered in the right greater than left kidneys are too small to characterize and not appreciably changed. Stable ablation defect in the posterior lower left kidney without appreciable enhancement (series 16/image 31). Stable ablation defect in the lateral upper left kidney without appreciable enhancement (series 11/image 60). No new renal lesions. Stomach/Bowel: Normal non-distended stomach. Visualized small and large bowel is normal caliber, with no bowel wall thickening. Vascular/Lymphatic: Atherosclerotic nonaneurysmal abdominal aorta. Patent portal,  splenic, hepatic and renal veins. No pathologically enlarged lymph nodes in the abdomen. Other: No pneumoperitoneum, ascites or focal fluid collection. Musculoskeletal: No aggressive appearing focal osseous lesions. Moderate thoracolumbar spondylosis. IMPRESSION: 1. No evidence of local tumor recurrence at the 2 cryoablation sites in the left kidney. No evidence of metastatic disease in the abdomen. 2. Chronic findings include: Aortic Atherosclerosis (ICD10-I70.0). Coronary atherosclerosis. Cholelithiasis. Electronically Signed   By: Ilona Sorrel M.D.   On: 06/06/2017 15:00   Ir Radiologist Eval & Mgmt  Result Date: 06/06/2017 Please refer to notes tab for details about interventional procedure. (Op Note)   Labs:  CBC: No results for input(s): WBC, HGB, HCT, PLT in the last 8760 hours.  COAGS: No results for input(s): INR, APTT in the last 8760 hours.  BMP: Recent Labs    08/01/16 0847 12/07/16 1212 06/06/17 1118  CREATININE 1.60* 1.60* 1.40*    LIVER FUNCTION TESTS: No results for input(s): BILITOT, AST, ALT, ALKPHOS, PROT, ALBUMIN in the last 8760  hours.  TUMOR MARKERS: No results for input(s): AFPTM, CEA, CA199, CHROMGRNA in the last 8760 hours.  Assessment and Plan:  72 year old male with history of treatment of 2 left RCC with cryoablation, February 2018.    Interval CT surveillance has shown no concerning findings, and he remains asymptomatic.    I discussed our follow up schedule with him.  I offered him a follow up appointment with CT scan at 2 years, which will be February 2020, in order to decrease the number of scans.  At that point, we can decide whether to maintain a twice a year schedule, or decrease to once a year.    He agrees with our plan.    Plan: - Office visit with repeat contrast CT in February 2020, which will be 2 years post therapy.  - I have advised him to observe his follow ups.   Electronically Signed: Corrie Mckusick 06/06/2017, 5:22 PM   I spent a total of    25 Minutes in face to face in clinical consultation, greater than 50% of which was counseling/coordinating care for left RCC, SP cryoablation.

## 2018-01-16 DIAGNOSIS — J449 Chronic obstructive pulmonary disease, unspecified: Secondary | ICD-10-CM | POA: Diagnosis present

## 2018-09-07 ENCOUNTER — Other Ambulatory Visit: Payer: Self-pay | Admitting: Interventional Radiology

## 2018-09-07 DIAGNOSIS — N2889 Other specified disorders of kidney and ureter: Secondary | ICD-10-CM

## 2018-09-14 ENCOUNTER — Encounter (HOSPITAL_COMMUNITY): Payer: Self-pay

## 2018-09-14 ENCOUNTER — Emergency Department (HOSPITAL_COMMUNITY)
Admission: EM | Admit: 2018-09-14 | Discharge: 2018-09-14 | Disposition: A | Discharge status: Home / Self Care | Payer: Medicare Other | Attending: Emergency Medicine | Admitting: Emergency Medicine

## 2018-09-14 ENCOUNTER — Other Ambulatory Visit: Payer: Self-pay

## 2018-09-14 ENCOUNTER — Emergency Department (HOSPITAL_COMMUNITY): Payer: Medicare Other

## 2018-09-14 DIAGNOSIS — E119 Type 2 diabetes mellitus without complications: Secondary | ICD-10-CM | POA: Diagnosis not present

## 2018-09-14 DIAGNOSIS — Z7984 Long term (current) use of oral hypoglycemic drugs: Secondary | ICD-10-CM | POA: Diagnosis not present

## 2018-09-14 DIAGNOSIS — Z7901 Long term (current) use of anticoagulants: Secondary | ICD-10-CM | POA: Insufficient documentation

## 2018-09-14 DIAGNOSIS — Z7982 Long term (current) use of aspirin: Secondary | ICD-10-CM | POA: Insufficient documentation

## 2018-09-14 DIAGNOSIS — Z79899 Other long term (current) drug therapy: Secondary | ICD-10-CM | POA: Diagnosis not present

## 2018-09-14 DIAGNOSIS — R0789 Other chest pain: Secondary | ICD-10-CM | POA: Insufficient documentation

## 2018-09-14 MED ORDER — HYDROCODONE-ACETAMINOPHEN 5-325 MG PO TABS: 1.0000 | ORAL_TABLET | Freq: Four times a day (QID) | ORAL | 0 refills | Status: DC | PRN

## 2018-09-14 NOTE — ED Notes (Signed)
Pt ambulatory from triage to room 

## 2018-09-14 NOTE — Discharge Instructions (Addendum)
Follow-up with your family doctor if not improving 

## 2018-09-14 NOTE — ED Provider Notes (Signed)
[facility] DEPT Provider Note   CSN: [id number] Arrival date & time: 09/14/18  1428     History   Chief Complaint Chief Complaint  Patient presents with  . Chest Pain    HPI Patrick Rocha is a 73 y.o. male.     Patient complains of right-sided chest pain with movement and palpation.  He believes he hurt his chest when a chiropractor was doing manipulation form  The history is provided by the patient and medical records.  Chest Pain Pain location:  R chest Pain quality: aching   Pain radiates to:  Does not radiate Pain severity:  Moderate Onset quality:  Sudden Timing:  Constant Progression:  Waxing and waning Chronicity:  New Context: raising an arm   Context: not breathing   Relieved by:  Nothing Worsened by:  Nothing Ineffective treatments:  None tried Associated symptoms: no abdominal pain, no back pain, no cough, no fatigue and no headache     Past Medical History:  Diagnosis Date  . Diabetes mellitus without complication ([place])   . renal ca    cryoablation left kidney 02/2016    Patient Active Problem List   Diagnosis Date Noted  . Left renal mass 03/18/2016    Past Surgical History:  Procedure Laterality Date  . IR GENERIC HISTORICAL  02/11/2016   IR RADIOLOGIST EVAL & MGMT 02/11/2016 Corrie Mckusick, DO [facility] INTERV RAD  . IR RADIOLOGIST EVAL & MGMT  04/12/2016  . IR RADIOLOGIST EVAL & MGMT  12/07/2016  . IR RADIOLOGIST EVAL & MGMT  06/06/2017        Home Medications    Prior to Admission medications   Medication Sig Start Date End Date Taking? Authorizing Provider  aspirin 325 MG tablet Take 325 mg by mouth daily.   Yes [provider]  atorvastatin (LIPITOR) 40 MG tablet Take 40 mg by mouth daily.   Yes [provider]  benazepril (LOTENSIN) 40 MG tablet Take 20 mg by mouth daily.    Yes [provider]  carvedilol (COREG) 3.125 MG tablet Take 3.125 mg by mouth 2 (two) times daily  with a meal.   Yes [provider]  Cinnamon 500 MG capsule Take 500 mg by mouth daily. Takes 2 tablet po daily   Yes [provider]  diltiazem (DILTIAZEM CD) 180 MG 24 hr capsule Take 180 mg by mouth 2 (two) times daily.   Yes [provider]  escitalopram (LEXAPRO) 10 MG tablet Take 10 mg by mouth daily.   Yes [provider]  fenofibrate (TRICOR) 145 MG tablet Take 145 mg by mouth daily.   Yes [provider]  ferrous sulfate 325 (65 FE) MG tablet Take 325 mg by mouth 2 (two) times daily with a meal.   Yes [provider]  fexofenadine (ALLEGRA) 180 MG tablet Take 180 mg by mouth 2 (two) times daily.   Yes [provider]  furosemide (LASIX) 20 MG tablet Take 20 mg by mouth daily.   Yes [provider]  glipiZIDE (GLUCOTROL) 5 MG tablet Take 5 mg by mouth daily before breakfast.   Yes [provider]  levothyroxine (SYNTHROID, LEVOTHROID) 150 MCG tablet Take 150 mcg by mouth daily before breakfast.   Yes [provider]  metFORMIN (GLUCOPHAGE) 1000 MG tablet Take 1,000 mg by mouth 2 (two) times daily with a meal.   Yes [provider]  Omega-3 Fatty Acids (FISH OIL) 1000 MG CAPS Take 1,000  mg by mouth 3 (three) times daily.   Yes [provider]  pantoprazole (PROTONIX) 40 MG tablet Take 40 mg by mouth 2 (two) times daily.   Yes [provider]  pioglitazone (ACTOS) 15 MG tablet Take 15 mg by mouth daily.   Yes [provider]  rivaroxaban (XARELTO) 20 MG TABS tablet Take 20 mg by mouth daily with supper.   Yes [provider]  tamsulosin (FLOMAX) 0.4 MG CAPS capsule Take 0.4 mg by mouth daily.   Yes [provider]  triamcinolone cream (KENALOG) 0.1 % Apply 1 application topically daily.   Yes [provider]  HYDROcodone-acetaminophen (NORCO/VICODIN) 5-325 MG tablet Take 1 tablet by mouth every 6 (six) hours as needed for moderate pain. 09/14/18    Milton Ferguson, MD    Family History No family history on file.  Social History Social History   Tobacco Use  . Smoking status: Never Smoker  . Smokeless tobacco: Never Used  Substance Use Topics  . Alcohol use: Not on file  . Drug use: Not on file     Allergies   Patient has no known allergies.   Review of Systems Review of Systems  Constitutional: Negative for appetite change and fatigue.  HENT: Negative for congestion, ear discharge and sinus pressure.   Eyes: Negative for discharge.  Respiratory: Negative for cough.   Cardiovascular: Positive for chest pain.  Gastrointestinal: Negative for abdominal pain and diarrhea.  Genitourinary: Negative for frequency and hematuria.  Musculoskeletal: Negative for back pain.  Skin: Negative for rash.  Neurological: Negative for seizures and headaches.  Psychiatric/Behavioral: Negative for hallucinations.     Physical Exam Updated Vital Signs BP (!) 155/82 (BP Location: Right Arm)   Pulse (!) 53   Temp (!) 97.4 F (36.3 C) (Oral)   Resp 18   Ht 5\' 8"  (1.727 m)   Wt 121.6 kg   SpO2 95%   BMI 40.75 kg/m   Physical Exam Vitals signs and nursing note reviewed.  Constitutional:      Appearance: He is well-developed and normal weight.  HENT:     Head: Normocephalic.     Mouth/Throat:     Mouth: Mucous membranes are moist.  Eyes:     General: No scleral icterus.    Conjunctiva/sclera: Conjunctivae normal.  Neck:     Musculoskeletal: Neck supple.     Thyroid: No thyromegaly.  Cardiovascular:     Rate and Rhythm: Normal rate and regular rhythm.     Heart sounds: No murmur. No friction rub. No gallop.   Pulmonary:     Breath sounds: No stridor. No wheezing or rales.     Comments: Tenderness right lower ant. chest Chest:     Chest wall: No tenderness.  Abdominal:     General: There is no distension.     Tenderness: There is no abdominal tenderness. There is no rebound.  Musculoskeletal: Normal range of motion.   Lymphadenopathy:     Cervical: No cervical adenopathy.  Skin:    Findings: No erythema or rash.  Neurological:     Mental Status: He is oriented to person, place, and time.     Motor: No abnormal muscle tone.     Coordination: Coordination normal.  Psychiatric:        Behavior: Behavior normal.      ED Treatments / Results  Labs (all labs ordered are listed, but only abnormal results are displayed) Labs Reviewed - No data to display  EKG  EKG Interpretation  Date/Time:  Friday September 14 2018 14:39:15 EDT Ventricular Rate:  60 PR Interval:    QRS Duration: 109 QT Interval:  412 QTC Calculation: 412 R Axis:   -16 Text Interpretation:  Sinus rhythm Borderline left axis deviation Abnormal R-wave progression, early transition Confirmed by Milton Ferguson [id number]) on 09/14/2018 5:24:57 PM   Radiology Dg Chest 2 View  Result Date: 09/14/2018 CLINICAL DATA:  Pain under right breast. EXAM: CHEST - 2 VIEW COMPARISON:  CT 06/06/2017. FINDINGS: Mediastinum hilar structures normal. Mild left base subsegmental atelectasis/scarring. No pleural effusion or pneumothorax. Cardiomegaly with normal pulmonary vascularity. Degenerative changes and scoliosis thoracic spine. IMPRESSION: 1.  Cardiomegaly with normal pulmonary vascularity. 2.  Mild left base subsegmental atelectasis/scarring. Electronically Signed   By: Marcello Moores  Register   On: 09/14/2018 15:10    Procedures Procedures (including critical care time)  Medications Ordered in ED Medications - No data to display   Initial Impression / Assessment and Plan / ED Course  I have reviewed the triage vital signs and the nursing notes.  Pertinent labs & imaging results that were available during my care of the patient were reviewed by me and considered in my medical decision making (see chart for details).       Chest x-ray reviewed and negative.  Patient with chest wall pain.  He is given some Vicodin will follow-up with PCP  Final  Clinical Impressions(s) / ED Diagnoses   Final diagnoses:  Chest wall pain    ED Discharge Orders         Ordered    HYDROcodone-acetaminophen (NORCO/VICODIN) 5-325 MG tablet  Every 6 hours PRN     09/14/18 1749           Milton Ferguson, MD 09/14/18 1752

## 2018-09-14 NOTE — ED Triage Notes (Addendum)
Patient c/o pain under right breast that radiates around to his back.   Patient states it is rib pain.  2/10 dull chest pain.   Patient states his pain started after going to the chiropractor. Patient thought it was a pulled muscle and has gotten worse the last couple of days to the point he can not sleep.   A/ox4 Ambulatory in triage.

## 2018-09-17 ENCOUNTER — Ambulatory Visit (HOSPITAL_COMMUNITY)
Admission: RE | Admit: 2018-09-17 | Discharge: 2018-09-17 | Disposition: A | Discharge status: Home / Self Care | Payer: Medicare Other | Source: Ambulatory Visit | Attending: Interventional Radiology | Admitting: Interventional Radiology

## 2018-09-17 ENCOUNTER — Other Ambulatory Visit: Payer: Self-pay

## 2018-09-17 ENCOUNTER — Encounter (HOSPITAL_COMMUNITY): Payer: Self-pay

## 2018-09-17 DIAGNOSIS — N2889 Other specified disorders of kidney and ureter: Secondary | ICD-10-CM | POA: Diagnosis present

## 2018-09-17 LAB — POCT I-STAT CREATININE
Creatinine, Ser: 1.8 mg/dL — ABNORMAL HIGH (ref 0.61–1.24)
Creatinine, Ser: 1.9 mg/dL — ABNORMAL HIGH (ref 0.61–1.24)

## 2018-09-17 MED ORDER — IOHEXOL 300 MG/ML  SOLN
100.0000 mL | Freq: Once | INTRAMUSCULAR | Status: AC | PRN
  Administered 2018-09-17: 80 mL via INTRAVENOUS

## 2018-09-18 ENCOUNTER — Ambulatory Visit
Admission: RE | Admit: 2018-09-18 | Discharge: 2018-09-18 | Disposition: A | Discharge status: Home / Self Care | Payer: Medicare Other | Source: Ambulatory Visit | Attending: Interventional Radiology | Admitting: Interventional Radiology

## 2018-09-18 ENCOUNTER — Encounter: Payer: Self-pay | Admitting: *Deleted

## 2018-09-18 DIAGNOSIS — N2889 Other specified disorders of kidney and ureter: Secondary | ICD-10-CM

## 2018-09-18 HISTORY — PX: IR RADIOLOGIST EVAL & MGMT: IMG5224

## 2018-09-18 NOTE — Progress Notes (Signed)
Chief Complaint: Ablation for left RCC   Referring Physician(s): Dr. Rosario Adie  PCP: Toney Reil, Rondall Allegra  History of Present Illness: Patrick Rocha is a 73 y.o. male presenting as a scheduled follow up to [place] clinic today, SP treatment of 2 left RCC.   We performed a telemedicine visit today given the current COVID situation.   We treated Patrick Cure 03/17/2016 with cryoablation of 2 separate RCC, each was path confirmed during the treatment.  He recovered easily, and has done well over the past 2 years.   We performed CT scan again 09/17/2018, and there are no concerning features at the treatment sites.    He denies any symptoms at this time.    He was evaluated in the ED on 09/14/2018, which appears to be related to a MSK complaint.      Past Medical History:  Diagnosis Date  . Diabetes mellitus without complication ([place])   . renal ca    cryoablation left kidney 02/2016    Past Surgical History:  Procedure Laterality Date  . IR GENERIC HISTORICAL  02/11/2016   IR RADIOLOGIST EVAL & MGMT 02/11/2016 Corrie Mckusick, DO [facility] INTERV RAD  . IR RADIOLOGIST EVAL & MGMT  04/12/2016  . IR RADIOLOGIST EVAL & MGMT  12/07/2016  . IR RADIOLOGIST EVAL & MGMT  06/06/2017    Allergies: Patient has no known allergies.  Medications: Prior to Admission medications   Medication Sig Start Date End Date Taking? Authorizing Provider  aspirin 325 MG tablet Take 325 mg by mouth daily.    [provider]  atorvastatin (LIPITOR) 40 MG tablet Take 40 mg by mouth daily.    [provider]  benazepril (LOTENSIN) 40 MG tablet Take 20 mg by mouth daily.     [provider]  carvedilol (COREG) 3.125 MG tablet Take 3.125 mg by mouth 2 (two) times daily with a meal.    [provider]  Cinnamon 500 MG capsule Take 500 mg by mouth daily. Takes 2 tablet po daily    [provider]  diltiazem (DILTIAZEM CD) 180 MG 24 hr capsule Take 180  mg by mouth 2 (two) times daily.    [provider]  escitalopram (LEXAPRO) 10 MG tablet Take 10 mg by mouth daily.    [provider]  fenofibrate (TRICOR) 145 MG tablet Take 145 mg by mouth daily.    [provider]  ferrous sulfate 325 (65 FE) MG tablet Take 325 mg by mouth 2 (two) times daily with a meal.    [provider]  fexofenadine (ALLEGRA) 180 MG tablet Take 180 mg by mouth 2 (two) times daily.    [provider]  furosemide (LASIX) 20 MG tablet Take 20 mg by mouth daily.    [provider]  glipiZIDE (GLUCOTROL) 5 MG tablet Take 5 mg by mouth daily before breakfast.    [provider]  HYDROcodone-acetaminophen (NORCO/VICODIN) 5-325 MG tablet Take 1 tablet by mouth every 6 (six) hours as needed for moderate pain. 09/14/18   Milton Ferguson, MD  levothyroxine (SYNTHROID, LEVOTHROID) 150 MCG tablet Take 150 mcg by mouth daily before breakfast.    [provider]  metFORMIN (GLUCOPHAGE) 1000 MG tablet Take 1,000 mg by mouth 2 (two) times daily with a meal.    [provider]  Omega-3 Fatty Acids (FISH OIL) 1000 MG CAPS Take 1,000 mg by mouth 3 (three) times daily.    [provider]  pantoprazole (PROTONIX) 40 MG tablet Take 40 mg by mouth 2 (two) times daily.    [provider]  pioglitazone (ACTOS) 15 MG tablet Take 15 mg by mouth daily.    [provider]  rivaroxaban (XARELTO) 20 MG TABS tablet Take 20 mg by mouth daily with supper.    [provider]  tamsulosin (FLOMAX) 0.4 MG CAPS capsule Take 0.4 mg by mouth daily.    [provider]  triamcinolone cream (KENALOG) 0.1 % Apply 1 application topically daily.    [provider]     No family history on file.  Social History   Socioeconomic History  . Marital status: Widowed    Spouse name: Not on file  . Number of children: Not on file  . Years of education: Not on file  . Highest education  level: Not on file  Occupational History  . Not on file  Social Needs  . Financial resource strain: Not on file  . Food insecurity    Worry: Not on file    Inability: Not on file  . Transportation needs    Medical: Not on file    Non-medical: Not on file  Tobacco Use  . Smoking status: Never Smoker  . Smokeless tobacco: Never Used  Substance and Sexual Activity  . Alcohol use: Not on file  . Drug use: Not on file  . Sexual activity: Not on file  Lifestyle  . Physical activity    Days per week: Not on file    Minutes per session: Not on file  . Stress: Not on file  Relationships  . Social Herbalist on phone: Not on file    Gets together: Not on file    Attends religious service: Not on file    Active member of club or organization: Not on file    Attends meetings of clubs or organizations: Not on file    Relationship status: Not on file  Other Topics Concern  . Not on file  Social History Narrative  . Not on file      Review of Systems  Review of Systems: A 12 point ROS discussed and pertinent positives are indicated in the HPI above.  All other systems are negative.  Physical Exam No direct physical exam was performed (except for noted visual exam findings with Video Visits).    Vital Signs: There were no vitals taken for this visit.  Imaging: Dg Chest 2 View  Result Date: 09/14/2018 CLINICAL DATA:  Pain under right breast. EXAM: CHEST - 2 VIEW COMPARISON:  CT 06/06/2017. FINDINGS: Mediastinum hilar structures normal. Mild left base subsegmental atelectasis/scarring. No pleural effusion or pneumothorax. Cardiomegaly with normal pulmonary vascularity. Degenerative changes and scoliosis thoracic spine. IMPRESSION: 1.  Cardiomegaly with normal pulmonary vascularity. 2.  Mild left base subsegmental atelectasis/scarring. Electronically Signed   By: Marcello Moores  Register   On: 09/14/2018 15:10   Ct Abdomen W Wo Contrast  Result Date: 09/17/2018 CLINICAL DATA:   Two left renal masses status post cryoablation February 2018. Routine follow-up. EXAM: CT ABDOMEN WITHOUT AND WITH CONTRAST TECHNIQUE: Multidetector CT imaging of the abdomen was performed following the standard protocol before and following the bolus administration of intravenous contrast. CONTRAST:  62mL OMNIPAQUE IOHEXOL 300 MG/ML  SOLN COMPARISON:  06/06/2017 CT abdomen. FINDINGS: Lower chest: No significant pulmonary nodules or acute consolidative airspace disease. Coronary atherosclerosis. Hepatobiliary: Normal liver with no liver mass. Normal gallbladder with no radiopaque cholelithiasis. No biliary ductal dilatation.  Pancreas: Normal, with no mass or duct dilation. Spleen: Normal size spleen. Stable cystic 1.1 cm posterior splenic lesion, considered benign. No new splenic lesions. Adrenals/Urinary Tract: Normal adrenals. No renal stones. No hydronephrosis. Exophytic simple 2.6 cm lateral upper right renal cyst. Homogeneous hyperdense anterior interpolar 1.3 cm right renal cortical lesion without appreciable enhancement (series 11/image 51), stable in size, compatible with a Bosniak category 2 hemorrhagic/proteinaceous renal cyst. Ablation defect in the lateral upper left kidney measures 1.8 x 0.9 cm and demonstrates no appreciable enhancement (series 11/image 49), previously 2.4 x 1.4 cm, decreased. Ablation defect in the posterior lower left kidney measures 1.7 x 1.2 cm (series 11/image 60) without convincing enhancement, previously 1.8 x 1.3 cm, not appreciably changed. Additional subcentimeter hypodense renal cortical lesions scattered in both kidneys are too small to characterize and are unchanged. No new significant pulmonary nodules. Stomach/Bowel: Normal non-distended stomach. Visualized small and large bowel is normal caliber, with no bowel wall thickening. Vascular/Lymphatic: Atherosclerotic nonaneurysmal abdominal aorta. Patent portal, splenic, hepatic and renal veins. No pathologically enlarged  lymph nodes in the abdomen. Other: No pneumoperitoneum, ascites or focal fluid collection. Musculoskeletal: No aggressive appearing focal osseous lesions. Moderate lumbar spondylosis. IMPRESSION: 1. Stable ablation sites in the left kidney with no findings to suggest local tumor recurrence. 2. No evidence of metastatic disease in the abdomen. 3.  Aortic Atherosclerosis (ICD10-I70.0). Electronically Signed   By: Ilona Sorrel M.D.   On: 09/17/2018 08:52    Labs:  CBC: No results for input(s): WBC, HGB, HCT, PLT in the last 8760 hours.  COAGS: No results for input(s): INR, APTT in the last 8760 hours.  BMP: Recent Labs    09/17/18 0741 09/17/18 0747  CREATININE 1.90* 1.80*    LIVER FUNCTION TESTS: No results for input(s): BILITOT, AST, ALT, ALKPHOS, PROT, ALBUMIN in the last 8760 hours.  TUMOR MARKERS: No results for input(s): AFPTM, CEA, CA199, CHROMGRNA in the last 8760 hours.  Assessment and Plan:  Patrick Rocha is a 73 yo male with history of 2 separate left RCC, treated at the same session with cryoablation 03/17/2016.     He has no concerning features identified on his previous and most recent CT. I discussed this with him today.   The majority of our conversation was regarding our surveillance schedule.  I think an annual CT abd would be reasonable for up to 5 years, which would be 2023, at which time we can discuss discontinuation.    He agrees with our plan.   Plan: - Repeat CT abd with contrast (kidney protocol) in 1 year. - I have advised him to observe his other doctor appointments.      Electronically Signed: Corrie Mckusick 09/18/2018, 1:19 PM   I spent a total of    25 Minutes in remote  clinical consultation, greater than 50% of which was counseling/coordinating care for left RCC, multifocal, SP cryoablation.    Visit type: Audio only (telephone). Audio (no video) only due to no video capability. Alternative for in-person consultation at [facility], [address] [place], [place], [place]. This visit type was conducted due to national recommendations for restrictions regarding the COVID-19 Pandemic (e.g. social distancing).  This format is felt to be most appropriate for this patient at this time.  All issues noted in this document were discussed and addressed.

## 2019-03-06 ENCOUNTER — Ambulatory Visit: Payer: Medicare Other

## 2019-03-15 ENCOUNTER — Ambulatory Visit: Payer: Medicare Other

## 2019-03-27 ENCOUNTER — Ambulatory Visit: Payer: Medicare Other

## 2019-09-04 ENCOUNTER — Other Ambulatory Visit: Payer: Self-pay

## 2019-09-04 ENCOUNTER — Other Ambulatory Visit: Payer: Self-pay | Admitting: Interventional Radiology

## 2019-09-04 DIAGNOSIS — N2889 Other specified disorders of kidney and ureter: Secondary | ICD-10-CM

## 2019-09-27 ENCOUNTER — Encounter (HOSPITAL_COMMUNITY): Payer: Self-pay

## 2019-09-27 ENCOUNTER — Ambulatory Visit (HOSPITAL_COMMUNITY)
Admission: RE | Admit: 2019-09-27 | Discharge: 2019-09-27 | Disposition: A | Discharge status: Home / Self Care | Payer: Medicare Other | Source: Ambulatory Visit | Attending: Interventional Radiology | Admitting: Interventional Radiology

## 2019-09-27 ENCOUNTER — Other Ambulatory Visit: Payer: Self-pay

## 2019-09-27 DIAGNOSIS — N2889 Other specified disorders of kidney and ureter: Secondary | ICD-10-CM | POA: Diagnosis not present

## 2019-09-27 LAB — POCT I-STAT CREATININE: Creatinine, Ser: 1.7 mg/dL — ABNORMAL HIGH (ref 0.61–1.24)

## 2019-09-27 MED ORDER — IOHEXOL 300 MG/ML  SOLN
80.0000 mL | Freq: Once | INTRAMUSCULAR | Status: AC | PRN
  Administered 2019-09-27: 80 mL via INTRAVENOUS

## 2019-09-27 MED ORDER — SODIUM CHLORIDE (PF) 0.9 % IJ SOLN
INTRAMUSCULAR | Status: AC
  Filled 2019-09-27: qty 50

## 2019-10-01 ENCOUNTER — Other Ambulatory Visit: Payer: Self-pay

## 2019-10-01 ENCOUNTER — Ambulatory Visit
Admission: RE | Admit: 2019-10-01 | Discharge: 2019-10-01 | Disposition: A | Discharge status: Home / Self Care | Payer: Medicare Other | Source: Ambulatory Visit | Attending: Interventional Radiology | Admitting: Interventional Radiology

## 2019-10-01 DIAGNOSIS — N2889 Other specified disorders of kidney and ureter: Secondary | ICD-10-CM

## 2019-10-01 HISTORY — PX: IR RADIOLOGIST EVAL & MGMT: IMG5224

## 2019-10-01 NOTE — Progress Notes (Addendum)
Chief Complaint: Ablation for left RCC   Referring Physician(s): Dr. Rosario Adie  PCP: Toney Reil, Rondall Allegra  History of Present Illness: Patrick Rocha is a 74 y.o. male presenting as a scheduled follow up to [place] clinic today, SP treatment of 2 left RCC.   We performed a telemedicine visit today given the current COVID situation.   He tells me that he is doing fine, with no new complaints.  He says he did need to make a change recently with blood pressure medications, because of BP that was higher.  He is still seeing his PCP Dr. Kirk Ruths.   He denies any flank pain or gross hematuria.  His renal function remains stable.   We have performed a CT 09/27/19 for surveillance, and this shows that there are no concerning features of the left sided ablation zones.    He has bilateral additional renal lesions, which are most compatible with cysts.  There is a right anterior/medial lesion on image 33 series 2 of recent CT that will need to be watched.  The HU's of this all remain in the 30 range on 3 phases on the CT, so it seems this represents a complex cyst.    Past Medical History:  Diagnosis Date  . Diabetes mellitus without complication ([place])   . renal ca    cryoablation left kidney 02/2016    Past Surgical History:  Procedure Laterality Date  . IR GENERIC HISTORICAL  02/11/2016   IR RADIOLOGIST EVAL & MGMT 02/11/2016 Corrie Mckusick, DO [facility] INTERV RAD  . IR RADIOLOGIST EVAL & MGMT  04/12/2016  . IR RADIOLOGIST EVAL & MGMT  12/07/2016  . IR RADIOLOGIST EVAL & MGMT  06/06/2017  . IR RADIOLOGIST EVAL & MGMT  09/18/2018    Allergies: Patient has no known allergies.  Medications: Prior to Admission medications   Medication Sig Start Date End Date Taking? Authorizing Provider  aspirin 325 MG tablet Take 325 mg by mouth daily.    [provider]  atorvastatin (LIPITOR) 40 MG tablet Take 40 mg by mouth daily.    [provider]  benazepril  (LOTENSIN) 40 MG tablet Take 20 mg by mouth daily.     [provider]  carvedilol (COREG) 3.125 MG tablet Take 3.125 mg by mouth 2 (two) times daily with a meal.    [provider]  Cinnamon 500 MG capsule Take 500 mg by mouth daily. Takes 2 tablet po daily    [provider]  diltiazem (DILTIAZEM CD) 180 MG 24 hr capsule Take 180 mg by mouth 2 (two) times daily.    [provider]  escitalopram (LEXAPRO) 10 MG tablet Take 10 mg by mouth daily.    [provider]  fenofibrate (TRICOR) 145 MG tablet Take 145 mg by mouth daily.    [provider]  ferrous sulfate 325 (65 FE) MG tablet Take 325 mg by mouth 2 (two) times daily with a meal.    [provider]  fexofenadine (ALLEGRA) 180 MG tablet Take 180 mg by mouth 2 (two) times daily.    [provider]  furosemide (LASIX) 20 MG tablet Take 20 mg by mouth daily.    [provider]  glipiZIDE (GLUCOTROL) 5 MG tablet Take 5 mg by mouth daily before breakfast.    [provider]  HYDROcodone-acetaminophen (NORCO/VICODIN) 5-325 MG tablet Take 1 tablet by mouth every 6 (six) hours as needed for moderate pain. 09/14/18   Zammit,  Broadus John, MD  levothyroxine (SYNTHROID, LEVOTHROID) 150 MCG tablet Take 150 mcg by mouth daily before breakfast.    [provider]  metFORMIN (GLUCOPHAGE) 1000 MG tablet Take 1,000 mg by mouth 2 (two) times daily with a meal.    [provider]  Omega-3 Fatty Acids (FISH OIL) 1000 MG CAPS Take 1,000 mg by mouth 3 (three) times daily.    [provider]  pantoprazole (PROTONIX) 40 MG tablet Take 40 mg by mouth 2 (two) times daily.    [provider]  pioglitazone (ACTOS) 15 MG tablet Take 15 mg by mouth daily.    [provider]  rivaroxaban (XARELTO) 20 MG TABS tablet Take 20 mg by mouth daily with supper.    [provider]  tamsulosin (FLOMAX) 0.4 MG CAPS capsule Take 0.4 mg by mouth daily.     [provider]  triamcinolone cream (KENALOG) 0.1 % Apply 1 application topically daily.    [provider]     No family history on file.  Social History   Socioeconomic History  . Marital status: Widowed    Spouse name: Not on file  . Number of children: Not on file  . Years of education: Not on file  . Highest education level: Not on file  Occupational History  . Not on file  Tobacco Use  . Smoking status: Never Smoker  . Smokeless tobacco: Never Used  Substance and Sexual Activity  . Alcohol use: Not on file  . Drug use: Not on file  . Sexual activity: Not on file  Other Topics Concern  . Not on file  Social History Narrative  . Not on file   Social Determinants of Health   Financial Resource Strain:   . Difficulty of Paying Living Expenses: Not on file  Food Insecurity:   . Worried About Charity fundraiser in the Last Year: Not on file  . Ran Out of Food in the Last Year: Not on file  Transportation Needs:   . Lack of Transportation (Medical): Not on file  . Lack of Transportation (Non-Medical): Not on file  Physical Activity:   . Days of Exercise per Week: Not on file  . Minutes of Exercise per Session: Not on file  Stress:   . Feeling of Stress : Not on file  Social Connections:   . Frequency of Communication with Friends and Family: Not on file  . Frequency of Social Gatherings with Friends and Family: Not on file  . Attends Religious Services: Not on file  . Active Member of Clubs or Organizations: Not on file  . Attends Archivist Meetings: Not on file  . Marital Status: Not on file       Review of Systems: A 12 point ROS discussed and pertinent positives are indicated in the HPI above.  All other systems are negative.  Review of Systems  Vital Signs: There were no vitals taken for this visit.  Physical Exam  Mallampati Score:     Imaging: CT ABDOMEN W WO CONTRAST  Result Date: 09/28/2019 CLINICAL DATA:   LEFT renal cell cancer post cryoablation. EXAM: CT ABDOMEN WITHOUT AND WITH CONTRAST TECHNIQUE: Multidetector CT imaging of the abdomen was performed following the standard protocol before and following the bolus administration of intravenous contrast. CONTRAST:  67mL OMNIPAQUE IOHEXOL 300 MG/ML  SOLN COMPARISON:  September 17, 2018 FINDINGS: Lower chest: Lung bases are clear. No consolidation. No pleural effusion. Hepatobiliary: No focal, suspicious hepatic lesion.  No pericholecystic stranding. No biliary duct dilation. Pancreas: Pancreas is normal without ductal dilation or inflammation. Spleen: Spleen is normal in size and contour without focal lesion. Adrenals/Urinary Tract: Adrenal glands are normal. Signs of LEFT renal cryoablation area measuring approximately 1.7 x 1.0 cm as compared to 1.7 x 1.2 cm. There is surrounding fat stranding, typical for ablated change. No hydronephrosis. Cysts are seen elsewhere in the bilateral kidneys. There is a small RIGHT renal lesion (image 34 of series 16 arising from the interpolar RIGHT kidney measuring approximately 1 cm, lesion displays no definite signs of enhancement though with limited assessment given small size. Stomach/Bowel: Stomach under distended limiting assessment. No acute gastrointestinal process to the extent evaluated. Vascular/Lymphatic: Calcified noncalcified atheromatous plaque of the abdominal aorta extending into branch vessels. No aneurysmal dilation of the abdominal aorta. No adenopathy in the retroperitoneum or in the upper abdomen. Other: No ascites. Musculoskeletal: Spinal degenerative change. No acute or destructive bone process. IMPRESSION: 1. Signs of LEFT renal cryoablation area measuring approximately 1.7 x 1.0 cm as compared to 1.7 x 1.2 cm. There is surrounding fat stranding, typical for ablated change. No signs of disease recurrence or metastasis. 2. There is a small RIGHT renal lesion arising from the interpolar RIGHT kidney measuring  approximately 1 cm, lesion displays no definite signs of enhancement though with limited assessment given small size. Findings could represent mildly hyperdense cyst, attention on follow-up 3. Aortic atherosclerosis. Aortic Atherosclerosis (ICD10-I70.0). Electronically Signed   By: Zetta Bills M.D.   On: 09/28/2019 15:56    Labs:  CBC: No results for input(s): WBC, HGB, HCT, PLT in the last 8760 hours.  COAGS: No results for input(s): INR, APTT in the last 8760 hours.  BMP: Recent Labs    09/27/19 1528  CREATININE 1.70*    LIVER FUNCTION TESTS: No results for input(s): BILITOT, AST, ALT, ALKPHOS, PROT, ALBUMIN in the last 8760 hours.  TUMOR MARKERS: No results for input(s): AFPTM, CEA, CA199, CHROMGRNA in the last 8760 hours.  Assessment and Plan:  Patrick Rocha is a 74 yo male with history of 2 separate left RCC, treated at the same session with cryoablation 03/17/2016.     He has no concerning features identified on his previous and most recent CT. I discussed this with him today.   The majority of our conversation was regarding our surveillance schedule.  I think an annual CT abd would be reasonable for up to 5 years, which would be 2023, at which time we can discuss discontinuation.  In particular I think this is important given he is no longer seeing his former Dealer.   He agrees with our plan.   Plan: - Repeat CT abd with contrast (kidney protocol) in 1 year. - I have advised him to observe his other doctor appointments.    Electronically Signed: Corrie Mckusick 10/01/2019, 1:31 PM   I spent a total of    25 Minutes in face to face in clinical consultation, greater than 50% of which was counseling/coordinating care for SP image guided ablation of left RCC x 2.   ADDENDUM: Created to address the WebEx visit.   I spent a total of    25 Minutes in remote  clinical consultation, greater than 50% of which was counseling/coordinating care for renal mass, SP image  guided ablation of left RCC x 2.    Visit type: Audio and video (webex).   Alternative for in-person consultation at [facility], [address], [place], [place]. This  visit type was conducted due to national recommendations for restrictions regarding the COVID-19 Pandemic (e.g. social distancing).  This format is felt to be most appropriate for this patient at this time.  All issues noted in this document were discussed and addressed.

## 2020-08-17 ENCOUNTER — Other Ambulatory Visit: Payer: Self-pay | Admitting: Interventional Radiology

## 2020-08-17 ENCOUNTER — Other Ambulatory Visit: Payer: Self-pay | Admitting: *Deleted

## 2020-08-17 DIAGNOSIS — N2889 Other specified disorders of kidney and ureter: Secondary | ICD-10-CM

## 2020-09-01 ENCOUNTER — Ambulatory Visit (HOSPITAL_COMMUNITY)
Admission: RE | Admit: 2020-09-01 | Discharge: 2020-09-01 | Disposition: A | Discharge status: Home / Self Care | Payer: Medicare Other | Source: Ambulatory Visit | Attending: Interventional Radiology | Admitting: Interventional Radiology

## 2020-09-01 ENCOUNTER — Other Ambulatory Visit: Payer: Self-pay

## 2020-09-01 DIAGNOSIS — N2889 Other specified disorders of kidney and ureter: Secondary | ICD-10-CM | POA: Diagnosis not present

## 2020-09-01 LAB — POCT I-STAT CREATININE: Creatinine, Ser: 1.7 mg/dL — ABNORMAL HIGH (ref 0.61–1.24)

## 2020-09-01 MED ORDER — IOHEXOL 350 MG/ML SOLN
100.0000 mL | Freq: Once | INTRAVENOUS | Status: AC | PRN
  Administered 2020-09-01: 80 mL via INTRAVENOUS

## 2020-09-08 ENCOUNTER — Ambulatory Visit
Admission: RE | Admit: 2020-09-08 | Discharge: 2020-09-08 | Disposition: A | Discharge status: Home / Self Care | Payer: Medicare Other | Source: Ambulatory Visit | Attending: Interventional Radiology | Admitting: Interventional Radiology

## 2020-09-08 ENCOUNTER — Encounter: Payer: Self-pay | Admitting: *Deleted

## 2020-09-08 ENCOUNTER — Other Ambulatory Visit: Payer: Self-pay

## 2020-09-08 DIAGNOSIS — N2889 Other specified disorders of kidney and ureter: Secondary | ICD-10-CM

## 2020-09-08 HISTORY — PX: IR RADIOLOGIST EVAL & MGMT: IMG5224

## 2020-09-08 NOTE — Progress Notes (Signed)
Chief Complaint: Ablation for left RCC    Referring Physician(s): Dr. Rosario Adie   PCP: Toney Reil, Rondall Allegra   History of Present Illness: Patrick Rocha is a 75 y.o. male presenting as a scheduled follow up to [facility] today, SP treatment of 2 left RCC.   We performed a telemedicine visit today given the current COVID situation.    He tells me that he is doing fine, with no new complaints.  He denies any interval hospitalization. He denies any flank pain or gross hematuria.  His renal function remains stable.   We have performed a CT 09/01/20 for surveillance, and this shows that there are no concerning features of the left sided ablation zones.     He has bilateral additional renal lesions, which are most compatible with cysts.    The right anterior/medial lesion on CT 09/27/19 (image 33 series 2) seems unchanged and does not enhance on recent study.   There is additional small lesion in the right kidney, interpolar region, image 62 of series 6.  This will need to be watched.    Currently he is not seeing any specific urologist.    Past Medical History:  Diagnosis Date   Diabetes mellitus without complication ([place])    renal ca    cryoablation left kidney 02/2016    Past Surgical History:  Procedure Laterality Date   IR GENERIC HISTORICAL  02/11/2016   IR RADIOLOGIST EVAL & MGMT 02/11/2016 Corrie Mckusick, DO [facility] INTERV RAD   IR RADIOLOGIST EVAL & MGMT  04/12/2016   IR RADIOLOGIST EVAL & MGMT  12/07/2016   IR RADIOLOGIST EVAL & MGMT  06/06/2017   IR RADIOLOGIST EVAL & MGMT  09/18/2018   IR RADIOLOGIST EVAL & MGMT  10/01/2019    Allergies: Patient has no known allergies.  Medications: Prior to Admission medications   Medication Sig Start Date End Date Taking? Authorizing Provider  aspirin 325 MG tablet Take 325 mg by mouth daily.    [provider]  atorvastatin (LIPITOR) 40 MG tablet Take 40 mg by mouth daily.    [provider]  benazepril (LOTENSIN) 40 MG tablet Take 20 mg by mouth daily.     [provider]  carvedilol (COREG) 3.125 MG tablet Take 3.125 mg by mouth 2 (two) times daily with a meal.    [provider]  Cinnamon 500 MG capsule Take 500 mg by mouth daily. Takes 2 tablet po daily    [provider]  diltiazem (DILTIAZEM CD) 180 MG 24 hr capsule Take 180 mg by mouth 2 (two) times daily.    [provider]  escitalopram (LEXAPRO) 10 MG tablet Take 10 mg by mouth daily.    [provider]  fenofibrate (TRICOR) 145 MG tablet Take 145 mg by mouth daily.    [provider]  ferrous sulfate 325 (65 FE) MG tablet Take 325 mg by mouth 2 (two) times daily with a meal.    [provider]  fexofenadine (ALLEGRA) 180 MG tablet Take 180 mg by mouth 2 (two) times daily.    [provider]  furosemide (LASIX) 20 MG tablet Take 20 mg by mouth daily.    [provider]  glipiZIDE (GLUCOTROL) 5 MG tablet Take 5 mg by mouth daily before breakfast.    [provider]  HYDROcodone-acetaminophen (NORCO/VICODIN) 5-325 MG tablet Take 1 tablet by mouth every 6 (six) hours as needed for moderate pain. 09/14/18  Milton Ferguson, MD  levothyroxine (SYNTHROID, LEVOTHROID) 150 MCG tablet Take 150 mcg by mouth daily before breakfast.    [provider]  metFORMIN (GLUCOPHAGE) 1000 MG tablet Take 1,000 mg by mouth 2 (two) times daily with a meal.    [provider]  Omega-3 Fatty Acids (FISH OIL) 1000 MG CAPS Take 1,000 mg by mouth 3 (three) times daily.    [provider]  pantoprazole (PROTONIX) 40 MG tablet Take 40 mg by mouth 2 (two) times daily.    [provider]  pioglitazone (ACTOS) 15 MG tablet Take 15 mg by mouth daily.    [provider]  rivaroxaban (XARELTO) 20 MG TABS tablet Take 20 mg by mouth daily with supper.    [provider]  tamsulosin (FLOMAX) 0.4 MG CAPS capsule Take 0.4 mg  by mouth daily.    [provider]  triamcinolone cream (KENALOG) 0.1 % Apply 1 application topically daily.    [provider]     No family history on file.  Social History   Socioeconomic History   Marital status: Widowed    Spouse name: Not on file   Number of children: Not on file   Years of education: Not on file   Highest education level: Not on file  Occupational History   Not on file  Tobacco Use   Smoking status: Never   Smokeless tobacco: Never  Substance and Sexual Activity   Alcohol use: Not on file   Drug use: Not on file   Sexual activity: Not on file  Other Topics Concern   Not on file  Social History Narrative   Not on file   Social Determinants of Health   Financial Resource Strain: Not on file  Food Insecurity: Not on file  Transportation Needs: Not on file  Physical Activity: Not on file  Stress: Not on file  Social Connections: Not on file       Review of Systems  Review of Systems: A 12 point ROS discussed and pertinent positives are indicated in the HPI above.  All other systems are negative.  Physical Exam No direct physical exam was performed (except for noted visual exam findings with Video Visits).   Vital Signs: There were no vitals taken for this visit.  Imaging: CT ABDOMEN W WO CONTRAST  Result Date: 09/02/2020 CLINICAL DATA:  History of left renal cell cancer post cryoablation. EXAM: CT ABDOMEN WITHOUT AND WITH CONTRAST TECHNIQUE: Multidetector CT imaging of the abdomen was performed following the standard protocol before and following the bolus administration of intravenous contrast. CONTRAST:  37m OMNIPAQUE IOHEXOL 350 MG/ML SOLN COMPARISON:  Multiple priors including most recent CT September 27, 2019 FINDINGS: Lower chest: No acute abnormality. Hepatobiliary: No suspicious hepatic lesion. Gallbladder is unremarkable. No biliary ductal dilation. Pancreas: Within normal limits. Spleen: Normal size spleen. Hypodense  1.1 cm nonenhancing splenic lesion is stable dating back to at least June 06, 2017, consistent with a benign etiology such as a cyst. Adrenals/Urinary Tract: Bilateral adrenal glands are unremarkable. No hydronephrosis. Stable changes of prior left renal cryoablation with the treatment zone measuring 1.7 x 1.0 cm, unchanged. Similar appearance of the adjacent fat stranding, typical for at lesion change. No solid enhancing nodularity in the treatment bed. Unchanged size of the small 1 cm right interpolar renal lesion on image 62/6 which does not demonstrate definite postcontrast enhancement but assessment of which is limited by small size. Bilateral renal cysts. Stomach/Bowel: Stomach is within normal limits.  No evidence of bowel wall thickening, distention, or inflammatory changes. Vascular/Lymphatic: Aortic atherosclerosis without abdominal aortic aneurysm. No pathologically enlarged abdominal lymph nodes. Other: No abdominal ascites. Musculoskeletal: Multilevel degenerative changes spine. No aggressive lytic or blastic lesion of bone. IMPRESSION: 1. Stable changes of prior left renal cryoablation. No evidence of recurrent or metastatic disease within the abdomen. 2. Unchanged size of the small right interpolar renal lesion, which does not demonstrate definite postcontrast enhancement. Possibly representing a hemorrhagic or proteinaceous cyst but incompletely evaluated given its small size. Continued attention on follow-up imaging is suggested. 3.  Aortic Atherosclerosis (ICD10-I70.0). Electronically Signed   By: Dahlia Bailiff MD   On: 09/02/2020 13:02    Labs:  CBC: No results for input(s): WBC, HGB, HCT, PLT in the last 8760 hours.  COAGS: No results for input(s): INR, APTT in the last 8760 hours.  BMP: Recent Labs    09/27/19 1528 09/01/20 1120  CREATININE 1.70* 1.70*    LIVER FUNCTION TESTS: No results for input(s): BILITOT, AST, ALT, ALKPHOS, PROT, ALBUMIN in the last 8760 hours.  TUMOR  MARKERS: No results for input(s): AFPTM, CEA, CA199, CHROMGRNA in the last 8760 hours.  Assessment and Plan:   Patrick Rocha is a 74 yo male with history of 2 separate left RCC, treated at the same session with cryoablation 03/17/2016.      He has no concerning features identified on his previous and most recent CT. I discussed this with him today.   We again discussed our surveillance schedule.  I think an annual CT abd would be reasonable for at least up to 5 years, and if things are unchanged, we can discuss discontinuation.      He agrees   Plan: - Repeat CT abd with contrast (kidney protocol) in 1 year.       Electronically Signed: Corrie Mckusick 09/08/2020, 9:02 AM   I spent a total of    15 Minutes in remote  clinical consultation, greater than 50% of which was counseling/coordinating care for left RCC x 2, with prior cryoablation.    Visit type: Audio only (telephone). Audio (no video) only due to patient's lack of internet/smartphone capability. Alternative for in-person consultation at [facility], [place] [address], [place], [place]. This visit type was conducted due to national recommendations for restrictions regarding the COVID-19 Pandemic (e.g. social distancing).  This format is felt to be most appropriate for this patient at this time.  All issues noted in this document were discussed and addressed.

## 2021-07-15 ENCOUNTER — Other Ambulatory Visit: Payer: Self-pay | Admitting: Interventional Radiology

## 2021-07-15 DIAGNOSIS — N2889 Other specified disorders of kidney and ureter: Secondary | ICD-10-CM

## 2021-08-25 ENCOUNTER — Ambulatory Visit (HOSPITAL_COMMUNITY)
Admission: RE | Admit: 2021-08-25 | Discharge: 2021-08-25 | Disposition: A | Discharge status: Home / Self Care | Payer: Medicare Other | Source: Ambulatory Visit | Attending: Interventional Radiology | Admitting: Interventional Radiology

## 2021-08-25 ENCOUNTER — Encounter (HOSPITAL_COMMUNITY): Payer: Self-pay

## 2021-08-25 DIAGNOSIS — N2889 Other specified disorders of kidney and ureter: Secondary | ICD-10-CM | POA: Diagnosis present

## 2021-08-25 LAB — POCT I-STAT CREATININE: Creatinine, Ser: 2.4 mg/dL — ABNORMAL HIGH (ref 0.61–1.24)

## 2021-08-25 MED ORDER — IOHEXOL 300 MG/ML  SOLN
100.0000 mL | Freq: Once | INTRAMUSCULAR | Status: AC | PRN
  Administered 2021-08-25: 100 mL via INTRAVENOUS

## 2021-08-25 MED ORDER — SODIUM CHLORIDE (PF) 0.9 % IJ SOLN
INTRAMUSCULAR | Status: AC
  Filled 2021-08-25: qty 50

## 2021-09-01 ENCOUNTER — Ambulatory Visit
Admission: RE | Admit: 2021-09-01 | Discharge: 2021-09-01 | Disposition: A | Discharge status: Home / Self Care | Payer: Medicare Other | Source: Ambulatory Visit | Attending: Interventional Radiology | Admitting: Interventional Radiology

## 2021-09-01 DIAGNOSIS — N2889 Other specified disorders of kidney and ureter: Secondary | ICD-10-CM

## 2021-09-01 NOTE — Progress Notes (Signed)
Chief Complaint: Ablation for left RCC    Referring Physician(s): Dr. Rosario Adie   PCP: Toney Reil, Rondall Allegra   History of Present Illness: Patrick Rocha is a 76 y.o. male presenting as a scheduled follow up to [place] clinic today, SP treatment of 2 left RCC, performed 03/2016.   We performed a telemedicine visit today.  We confirmed his identity with 2 personal identifiers.   Hx:   the tumor was first identified on a CT in 2015, which was performed by his physician for other reasons.  Interval CT 10/27/2015 showed similar configuration, and an MRI was then performed   He never had symptoms.   We treated Patrick Rocha 03/17/2016 with cryoablation of 2 separate RCC, each was path confirmed during the treatment.  He recovered easily, and has done well.   Surveillance/follow up CT scans on at least annual basis have showed nothing concerning, with the last 09/01/20.   The last CT did show a possible proteinaceous cyst on the right.    Interval: He is doing fine, and denies any new symptoms of flank pain or hematuria.    He does have elevated creatinine on Istat test pending his scheduled contrast CT, with Cr last year 1.7, and his latest 2.4.  This precludes contrast.  He is unaware of any specific problem and is not seeing a nephrologist.     Past Medical History:  Diagnosis Date   Diabetes mellitus without complication ([place])    renal ca    cryoablation left kidney 02/2016    Past Surgical History:  Procedure Laterality Date   IR GENERIC HISTORICAL  02/11/2016   IR RADIOLOGIST EVAL & MGMT 02/11/2016 Corrie Mckusick, DO [facility] INTERV RAD   IR RADIOLOGIST EVAL & MGMT  04/12/2016   IR RADIOLOGIST EVAL & MGMT  12/07/2016   IR RADIOLOGIST EVAL & MGMT  06/06/2017   IR RADIOLOGIST EVAL & MGMT  09/18/2018   IR RADIOLOGIST EVAL & MGMT  10/01/2019   IR RADIOLOGIST EVAL & MGMT  09/08/2020    Allergies: Patient has no known allergies.  Medications: Prior to Admission  medications   Medication Sig Start Date End Date Taking? Authorizing Provider  aspirin 325 MG tablet Take 325 mg by mouth daily.    [provider]  atorvastatin (LIPITOR) 40 MG tablet Take 40 mg by mouth daily.    [provider]  benazepril (LOTENSIN) 40 MG tablet Take 20 mg by mouth daily.     [provider]  carvedilol (COREG) 3.125 MG tablet Take 3.125 mg by mouth 2 (two) times daily with a meal.    [provider]  Cinnamon 500 MG capsule Take 500 mg by mouth daily. Takes 2 tablet po daily    [provider]  diltiazem (DILTIAZEM CD) 180 MG 24 hr capsule Take 180 mg by mouth 2 (two) times daily.    [provider]  escitalopram (LEXAPRO) 10 MG tablet Take 10 mg by mouth daily.    [provider]  fenofibrate (TRICOR) 145 MG tablet Take 145 mg by mouth daily.    [provider]  ferrous sulfate 325 (65 FE) MG tablet Take 325 mg by mouth 2 (two) times daily with a meal.    [provider]  fexofenadine (ALLEGRA) 180 MG tablet Take 180 mg by mouth 2 (two) times daily.    [provider]  furosemide (LASIX) 20 MG tablet Take 20 mg by mouth daily.  [provider]  glipiZIDE (GLUCOTROL) 5 MG tablet Take 5 mg by mouth daily before breakfast.    [provider]  HYDROcodone-acetaminophen (NORCO/VICODIN) 5-325 MG tablet Take 1 tablet by mouth every 6 (six) hours as needed for moderate pain. 09/14/18   Milton Ferguson, MD  levothyroxine (SYNTHROID, LEVOTHROID) 150 MCG tablet Take 150 mcg by mouth daily before breakfast.    [provider]  metFORMIN (GLUCOPHAGE) 1000 MG tablet Take 1,000 mg by mouth 2 (two) times daily with a meal.    [provider]  Omega-3 Fatty Acids (FISH OIL) 1000 MG CAPS Take 1,000 mg by mouth 3 (three) times daily.    [provider]  pantoprazole (PROTONIX) 40 MG tablet Take 40 mg by mouth 2 (two) times daily.    [provider]   pioglitazone (ACTOS) 15 MG tablet Take 15 mg by mouth daily.    [provider]  rivaroxaban (XARELTO) 20 MG TABS tablet Take 20 mg by mouth daily with supper.    [provider]  tamsulosin (FLOMAX) 0.4 MG CAPS capsule Take 0.4 mg by mouth daily.    [provider]  triamcinolone cream (KENALOG) 0.1 % Apply 1 application topically daily.    [provider]     No family history on file.  Social History   Socioeconomic History   Marital status: Widowed    Spouse name: Not on file   Number of children: Not on file   Years of education: Not on file   Highest education level: Not on file  Occupational History   Not on file  Tobacco Use   Smoking status: Never   Smokeless tobacco: Never  Substance and Sexual Activity   Alcohol use: Not on file   Drug use: Not on file   Sexual activity: Not on file  Other Topics Concern   Not on file  Social History Narrative   Not on file   Social Determinants of Health   Financial Resource Strain: Not on file  Food Insecurity: Not on file  Transportation Needs: Not on file  Physical Activity: Not on file  Stress: Not on file  Social Connections: Not on file      Review of Systems  Review of Systems: A 12 point ROS discussed and pertinent positives are indicated in the HPI above.  All other systems are negative.      Physical Exam No direct physical exam was performed (except for noted visual exam findings with Video Visits).    Vital Signs: There were no vitals taken for this visit.  Imaging: No results found.  Labs:  CBC: No results for input(s): "WBC", "HGB", "HCT", "PLT" in the last 8760 hours.  COAGS: No results for input(s): "INR", "APTT" in the last 8760 hours.  BMP: Recent Labs    09/01/20 1120 08/25/21 0955  CREATININE 1.70* 2.40*    LIVER FUNCTION TESTS: No results for input(s): "BILITOT", "AST", "ALT", "ALKPHOS", "PROT", "ALBUMIN" in the last 8760 hours.  TUMOR  MARKERS: No results for input(s): "AFPTM", "CEA", "CA199", "CHROMGRNA" in the last 8760 hours.  Assessment and Plan:  Assessment and Plan:   Patrick Rocha is a 76 yo male with history of 2 separate left RCC, treated at the same session with cryoablation 03/17/2016.      He remains without symptoms.   He has no concerning features identified on his previous image of 1 year ago.  The most recent scheduled CT was not performed secondary to newly  elevated Cr of 2.4.  I discussed this with him today.   I think it is reasonable to perform non-contrast CT at this time to make sure no significant change. Then another 1 year follow up with MRI, which will allow [place] to look at the ablation sites as well as the contralateral kidney cyst.    Plan: - Follow through with non-contrast CT abdomen at this time, his convenience.   - We will plan on 1 year follow up visit with abdomen/kidney MRI without and with contrast, and then decide on any further utility for further surveillance.  He understands.  - I did encourage him to follow up with his PCP regarding his newly elevated Cr. Of 2.4   Electronically Signed: Corrie Mckusick 09/01/2021, 9:59 AM   I spent a total of    15 Minutes in remote  clinical consultation, greater than 50% of which was counseling/coordinating care for RCC, SP ablation, left side.    Visit type: Audio only (telephone). Audio (no video) only due to patient's lack of internet/smartphone capability. Alternative for in-person consultation at [facility], [place] [address], [place], [place]. This visit type was conducted due to national recommendations for restrictions regarding the COVID-19 Pandemic (e.g. social distancing).  This format is felt to be most appropriate for this patient at this time.  All issues noted in this document were discussed and addressed.

## 2021-09-02 ENCOUNTER — Emergency Department (HOSPITAL_COMMUNITY)
Admission: EM | Admit: 2021-09-02 | Discharge: 2021-09-02 | Disposition: A | Discharge status: Home / Self Care | Payer: Medicare Other | Attending: Emergency Medicine | Admitting: Emergency Medicine

## 2021-09-02 ENCOUNTER — Other Ambulatory Visit: Payer: Self-pay

## 2021-09-02 ENCOUNTER — Encounter (HOSPITAL_COMMUNITY): Payer: Self-pay

## 2021-09-02 ENCOUNTER — Emergency Department (HOSPITAL_COMMUNITY): Payer: Medicare Other

## 2021-09-02 DIAGNOSIS — N50812 Left testicular pain: Secondary | ICD-10-CM | POA: Insufficient documentation

## 2021-09-02 DIAGNOSIS — R739 Hyperglycemia, unspecified: Secondary | ICD-10-CM | POA: Insufficient documentation

## 2021-09-02 DIAGNOSIS — N433 Hydrocele, unspecified: Secondary | ICD-10-CM | POA: Insufficient documentation

## 2021-09-02 DIAGNOSIS — Z7982 Long term (current) use of aspirin: Secondary | ICD-10-CM | POA: Insufficient documentation

## 2021-09-02 DIAGNOSIS — B356 Tinea cruris: Secondary | ICD-10-CM | POA: Insufficient documentation

## 2021-09-02 LAB — URINALYSIS, ROUTINE W REFLEX MICROSCOPIC
Bacteria, UA: NONE SEEN
Bilirubin Urine: NEGATIVE
Glucose, UA: NEGATIVE mg/dL
Hgb urine dipstick: NEGATIVE
Ketones, ur: NEGATIVE mg/dL
Leukocytes,Ua: NEGATIVE
Nitrite: NEGATIVE
Protein, ur: 100 mg/dL — AB
Specific Gravity, Urine: 1.019 (ref 1.005–1.030)
pH: 5 (ref 5.0–8.0)

## 2021-09-02 LAB — CBC WITH DIFFERENTIAL/PLATELET
Abs Immature Granulocytes: 0.02 10*3/uL (ref 0.00–0.07)
Basophils Absolute: 0 10*3/uL (ref 0.0–0.1)
Basophils Relative: 0 %
Eosinophils Absolute: 0.1 10*3/uL (ref 0.0–0.5)
Eosinophils Relative: 3 %
HCT: 37.1 % — ABNORMAL LOW (ref 39.0–52.0)
Hemoglobin: 12.5 g/dL — ABNORMAL LOW (ref 13.0–17.0)
Immature Granulocytes: 0 %
Lymphocytes Relative: 25 %
Lymphs Abs: 1.1 10*3/uL (ref 0.7–4.0)
MCH: 32 pg (ref 26.0–34.0)
MCHC: 33.7 g/dL (ref 30.0–36.0)
MCV: 94.9 fL (ref 80.0–100.0)
Monocytes Absolute: 0.3 10*3/uL (ref 0.1–1.0)
Monocytes Relative: 6 %
Neutro Abs: 2.9 10*3/uL (ref 1.7–7.7)
Neutrophils Relative %: 66 %
Platelets: 196 10*3/uL (ref 150–400)
RBC: 3.91 MIL/uL — ABNORMAL LOW (ref 4.22–5.81)
RDW: 12.9 % (ref 11.5–15.5)
WBC: 4.5 10*3/uL (ref 4.0–10.5)
nRBC: 0 % (ref 0.0–0.2)

## 2021-09-02 LAB — BASIC METABOLIC PANEL
Anion gap: 8 (ref 5–15)
BUN: 23 mg/dL (ref 8–23)
CO2: 22 mmol/L (ref 22–32)
Calcium: 8.7 mg/dL — ABNORMAL LOW (ref 8.9–10.3)
Chloride: 111 mmol/L (ref 98–111)
Creatinine, Ser: 1.72 mg/dL — ABNORMAL HIGH (ref 0.61–1.24)
GFR, Estimated: 41 mL/min — ABNORMAL LOW (ref >60)
Glucose, Bld: 145 mg/dL — ABNORMAL HIGH (ref 70–99)
Potassium: 4.5 mmol/L (ref 3.5–5.1)
Sodium: 141 mmol/L (ref 135–145)

## 2021-09-02 LAB — CBG MONITORING, ED: Glucose-Capillary: 128 mg/dL — ABNORMAL HIGH (ref 70–99)

## 2021-09-02 MED ORDER — TRAMADOL HCL 50 MG PO TABS: 50.0000 mg | ORAL_TABLET | Freq: Four times a day (QID) | ORAL | 0 refills | Status: DC | PRN

## 2021-09-02 MED ORDER — KETOCONAZOLE 2 % EX CREA: 1.0000 | TOPICAL_CREAM | Freq: Every day | CUTANEOUS | 2 refills | Status: DC

## 2021-09-02 NOTE — ED Notes (Signed)
Ultrasound at bedside

## 2021-09-02 NOTE — ED Triage Notes (Signed)
Patient c/o left testicular pain x 7 days. Patient denies any swelling.

## 2021-09-02 NOTE — Discharge Instructions (Addendum)
You did not have any signs of urinary tract infection or infection in your testicles. I am discharging you with something for pain.  You may also use Tylenol.  Elevating the testicles on a pillow or using an ice pack can be helpful with a towel between the testicle and the ice pack.  Also supportive underwear can be helpful for testicle pain. Follow-up with urology regarding your pain. Get help right away if you: Develop a lot of pain or your pain becomes worse. Have chills. Have a high fever.

## 2021-09-02 NOTE — ED Provider Triage Note (Signed)
Emergency Medicine Provider Triage Evaluation Note  Patrick Rocha , a 76 y.o. male  was evaluated in triage.  Pt complains of left-sided testicle pain.  History of the same, worsening over the past few days.  No signs of heat redness, no increased swelling.  Patient states he is extremely uncomfortable.  Denies urinary symptoms..  Review of Systems  Positive: Testicle pain Negative: Abdominal pain  Physical Exam  Ht '5\' 9"'$  (1.753 m)   Wt 122.5 kg   BMI 39.87 kg/m  Gen:   Awake, no distress   Resp:  Normal effort  MSK:   Moves extremities without difficulty  Other:  Ambulatory  Medical Decision Making  Medically screening exam initiated at 4:22 PM.  Appropriate orders placed.  [name] was informed that the remainder of the evaluation will be completed by another provider, this initial triage assessment does not replace that evaluation, and the importance of remaining in the ED until their evaluation is complete.  Work-up initiated   Margarita Mail, PA-C 09/02/21 1623

## 2021-09-02 NOTE — ED Provider Notes (Signed)
[facility] DEPT Provider Note   CSN: [id number] Arrival date & time: 09/02/21  1556     History  Chief Complaint  Patient presents with   Testicle Pain    Patrick Rocha is a 76 y.o. male who presents emergency department chief complaint of left-sided testicle pain.  He has a history of the same however it has been getting significantly worse.  He denies any redness or heat, urinary symptoms, fevers or chills.   Testicle Pain       Home Medications Prior to Admission medications   Medication Sig Start Date End Date Taking? Authorizing Provider  ketoconazole (NIZORAL) 2 % cream Apply 1 Application topically daily. 09/02/21  Yes Alila Sotero, PA-C  traMADol (ULTRAM) 50 MG tablet Take 1 tablet (50 mg total) by mouth every 6 (six) hours as needed. 09/02/21  Yes Margarita Mail, PA-C  aspirin 325 MG tablet Take 325 mg by mouth daily.    [provider]  atorvastatin (LIPITOR) 40 MG tablet Take 40 mg by mouth daily.    [provider]  benazepril (LOTENSIN) 40 MG tablet Take 20 mg by mouth daily.     [provider]  carvedilol (COREG) 3.125 MG tablet Take 3.125 mg by mouth 2 (two) times daily with a meal.    [provider]  Cinnamon 500 MG capsule Take 500 mg by mouth daily. Takes 2 tablet po daily    [provider]  diltiazem (DILTIAZEM CD) 180 MG 24 hr capsule Take 180 mg by mouth 2 (two) times daily.    [provider]  escitalopram (LEXAPRO) 10 MG tablet Take 10 mg by mouth daily.    [provider]  fenofibrate (TRICOR) 145 MG tablet Take 145 mg by mouth daily.    [provider]  ferrous sulfate 325 (65 FE) MG tablet Take 325 mg by mouth 2 (two) times daily with a meal.    [provider]  fexofenadine (ALLEGRA) 180 MG tablet Take 180 mg by mouth 2 (two) times daily.    [provider]  furosemide (LASIX) 20 MG tablet Take 20 mg by mouth daily.     [provider]  glipiZIDE (GLUCOTROL) 5 MG tablet Take 5 mg by mouth daily before breakfast.    [provider]  HYDROcodone-acetaminophen (NORCO/VICODIN) 5-325 MG tablet Take 1 tablet by mouth every 6 (six) hours as needed for moderate pain. 09/14/18   Milton Ferguson, MD  levothyroxine (SYNTHROID, LEVOTHROID) 150 MCG tablet Take 150 mcg by mouth daily before breakfast.    [provider]  metFORMIN (GLUCOPHAGE) 1000 MG tablet Take 1,000 mg by mouth 2 (two) times daily with a meal.    [provider]  Omega-3 Fatty Acids (FISH OIL) 1000 MG CAPS Take 1,000 mg by mouth 3 (three) times daily.    [provider]  pantoprazole (PROTONIX) 40 MG tablet Take 40 mg by mouth 2 (two) times daily.    [provider]  pioglitazone (ACTOS) 15 MG tablet Take 15 mg by mouth daily.    [provider]  rivaroxaban (XARELTO) 20 MG TABS tablet Take 20 mg by mouth daily with supper.    [provider]  tamsulosin (FLOMAX) 0.4 MG CAPS capsule Take 0.4 mg by mouth daily.    [provider]  triamcinolone cream (KENALOG) 0.1 % Apply 1 application topically daily.    [provider]      Allergies    Patient has  no known allergies.    Review of Systems   Review of Systems  Genitourinary:  Positive for testicular pain.    Physical Exam Updated Vital Signs BP (!) 142/79   Pulse 78   Temp 98 F (36.7 C)   Resp 16   Ht '5\' 9"'$  (1.753 m)   Wt 122.5 kg   SpO2 96%   BMI 39.87 kg/m  Physical Exam Vitals and nursing note reviewed.  Constitutional:      General: He is not in acute distress.    Appearance: He is well-developed. He is not diaphoretic.  HENT:     Head: Normocephalic and atraumatic.  Eyes:     General: No scleral icterus.    Conjunctiva/sclera: Conjunctivae normal.  Cardiovascular:     Rate and Rhythm: Normal rate and regular rhythm.     Heart sounds: Normal heart sounds.  Pulmonary:     Effort: Pulmonary  effort is normal. No respiratory distress.     Breath sounds: Normal breath sounds.  Abdominal:     Palpations: Abdomen is soft.     Tenderness: There is no abdominal tenderness.  Genitourinary:    Comments: Exam chaperoned by Benita Stabile. Deep discoloration, macerated tissue and erythema noted in the intertriginous folds of the pannus and frontal region suggestive of chronic tinea. Normal male anatomy, circumcised, no penile discharge or erythema.  Scrotum is without lesions, erythema or tenderness.  No swelling or tenderness of the right testicle, left testicle is tender to palpation without significant swelling. Musculoskeletal:     Cervical back: Normal range of motion and neck supple.  Skin:    General: Skin is warm and dry.  Neurological:     Mental Status: He is alert.  Psychiatric:        Behavior: Behavior normal.     ED Results / Procedures / Treatments   Labs (all labs ordered are listed, but only abnormal results are displayed) Labs Reviewed  URINALYSIS, ROUTINE W REFLEX MICROSCOPIC - Abnormal; Notable for the following components:      Result Value   Protein, ur 100 (*)    All other components within normal limits  BASIC METABOLIC PANEL - Abnormal; Notable for the following components:   Glucose, Bld 145 (*)    Creatinine, Ser 1.72 (*)    Calcium 8.7 (*)    GFR, Estimated 41 (*)    All other components within normal limits  CBC WITH DIFFERENTIAL/PLATELET - Abnormal; Notable for the following components:   RBC 3.91 (*)    Hemoglobin 12.5 (*)    HCT 37.1 (*)    All other components within normal limits  CBG MONITORING, ED - Abnormal; Notable for the following components:   Glucose-Capillary 128 (*)    All other components within normal limits    EKG None  Radiology US SCROTUM W/DOPPLER  Result Date: 09/02/2021 CLINICAL DATA:  testicle  pain 1 EXAM: SCROTAL ULTRASOUND DOPPLER ULTRASOUND OF THE TESTICLES TECHNIQUE: Complete ultrasound examination of the  testicles, epididymis, and other scrotal structures was performed. Color and spectral Doppler ultrasound were also utilized to evaluate blood flow to the testicles. COMPARISON:  None Available. FINDINGS: Right testicle Measurements: 3.9 x 3.1 x 3.4 cm. No mass or microlithiasis visualized. Left testicle Measurements: 4 x 3 x 3.3 cm. No mass or microlithiasis visualized. Right epididymis: Epididymal head cyst. Normal in size and appearance. Left epididymis: Epididymal head cyst. Normal in size and appearance. Hydrocele: Bilateral small to moderate volume hydroceles. Varicocele:  None  visualized. Pulsed Doppler interrogation of both testes demonstrates normal low resistance arterial and venous waveforms bilaterally. IMPRESSION: Bilateral small to moderate volume hydroceles. Electronically Signed   By: Iven Finn M.D.   On: 09/02/2021 16:40    Procedures Procedures    Medications Ordered in ED Medications - No data to display  ED Course/ Medical Decision Making/ A&P Clinical Course as of 09/03/21 1824  Thu Sep 02, 2021  1653 US SCROTUM W/DOPPLER [AH]    Clinical Course User Index [AH] Margarita Mail, PA-C                           Medical Decision Making Patient here with testicle pain. The differential diagnosis of Emergent testicle pain includes but is not limited to Cellulitis, Fournier's gangrene, epididymitis, orchitis, testicular torsion, appendage torsion, trauma, indirect hernia.  After evaluation ofLabs patient does not appear to have a urinary tract infection.  He has mild hyperglycemia, no leukocytosis. I ordered and visualized as well as interpreted a testicular ultrasound which shows no evidence of torsion, epididymitis, or other abnormality.  Patient does have hydroceles present.  I suspect this is likely the cause of his pain as he has had this pain in the past.  This does not appear to be an emergent cause of testicular pain.  PDMP reviewed and I have ordered tramadol as  well as supportive care.  Patient is to follow closely with urology for further management and assessment of his testicular pain.  Discussed outpatient follow-up and return precautions.  Patient appears appropriate for discharge at this time  Amount and/or Complexity of Data Reviewed Labs: ordered. Radiology: ordered. Decision-making details documented in ED Course.  Risk Prescription drug management.            Final Clinical Impression(s) / ED Diagnoses Final diagnoses:  Hydrocele in adult  Left testicular pain  Tinea cruris    Rx / DC Orders ED Discharge Orders          Ordered    traMADol (ULTRAM) 50 MG tablet  Every 6 hours PRN        09/02/21 1851    ketoconazole (NIZORAL) 2 % cream  Daily        09/02/21 1851              Margarita Mail, PA-C 09/03/21 1824    Sherwood Gambler, MD 09/04/21 [phone]

## 2021-09-13 ENCOUNTER — Ambulatory Visit (HOSPITAL_COMMUNITY)
Admission: RE | Admit: 2021-09-13 | Discharge: 2021-09-13 | Disposition: A | Discharge status: Home / Self Care | Payer: Medicare Other | Source: Ambulatory Visit | Attending: Interventional Radiology | Admitting: Interventional Radiology

## 2021-09-13 ENCOUNTER — Other Ambulatory Visit: Payer: Self-pay | Admitting: Interventional Radiology

## 2021-09-13 DIAGNOSIS — N2889 Other specified disorders of kidney and ureter: Secondary | ICD-10-CM

## 2021-09-13 MED ORDER — IOHEXOL 300 MG/ML  SOLN: 100.0000 mL | Freq: Once | INTRAMUSCULAR | Status: DC | PRN

## 2021-09-13 MED ORDER — SODIUM CHLORIDE (PF) 0.9 % IJ SOLN
INTRAMUSCULAR | Status: AC
  Filled 2021-09-13: qty 50

## 2021-10-16 IMAGING — CT CT ABDOMEN WO/W CM
4 of 15 series · 11 of 46 positions shown, 17 images · IV contrast (OMNIPAQUE)
Comparison: Multiple priors including most recent CT September 27, 2019

CLINICAL DATA: History of left renal cell cancer post cryoablation.

EXAM:
CT ABDOMEN WITHOUT AND WITH CONTRAST
TECHNIQUE: Multidetector CT imaging of the abdomen was performed following the
standard protocol before and following the bolus administration of
intravenous contrast.
CONTRAST:  80mL OMNIPAQUE IOHEXOL 350 MG/ML SOLN

[Series 3: coronal pre · coronal · non-contrast · 0.59mm/px · 1 of 115 slices shown, 2 images]
[im 58/115  soft-tissue]
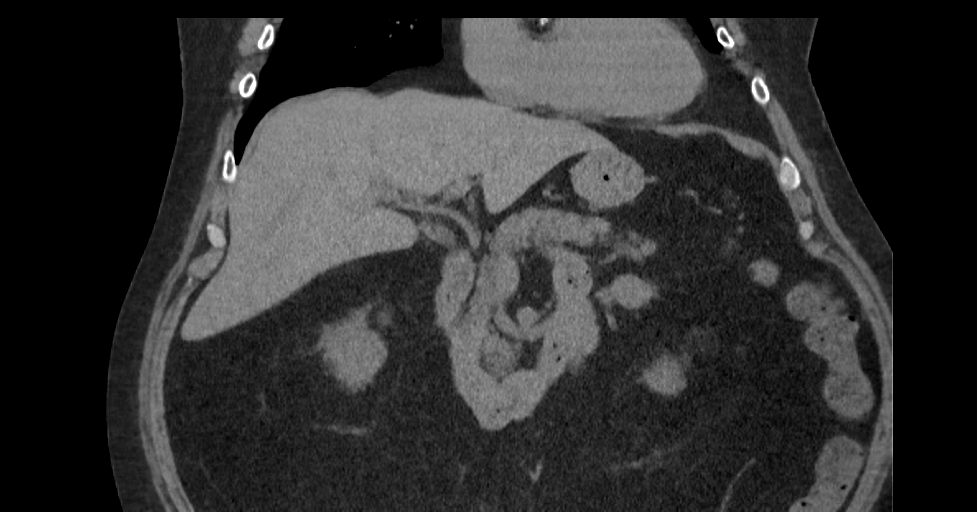
[im 58/115  bone]
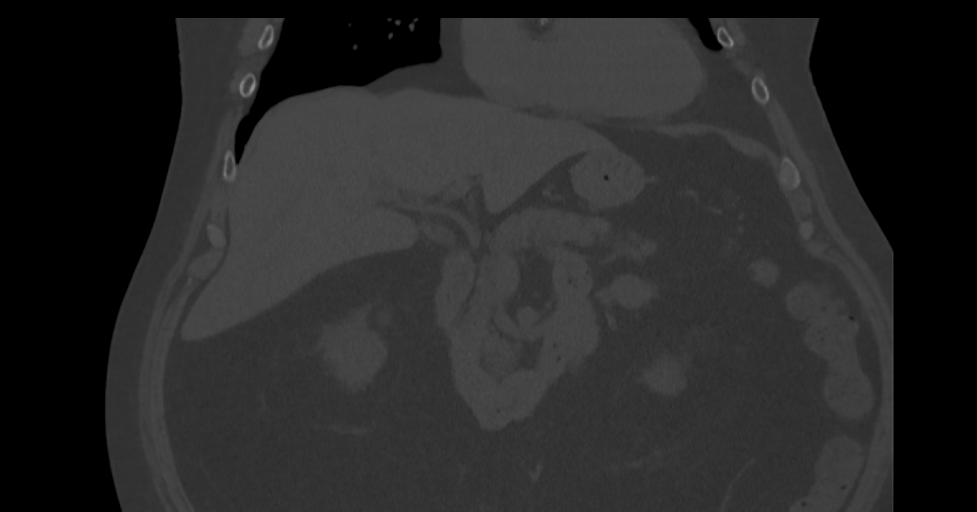

[Series 5: lung pre · axial · non-contrast · 0.92mm/px · z∈[-210,-172]mm · 2 of 75 slices shown]
[im 19/75  bone]
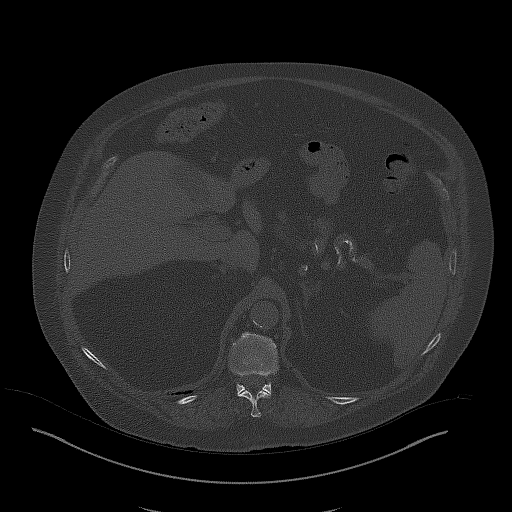
[im 38/75  bone]
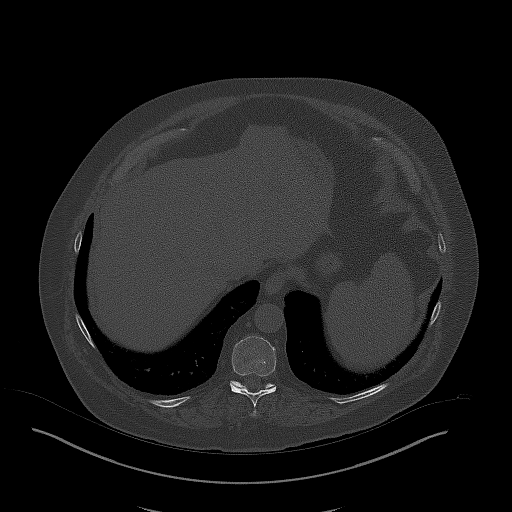

[Series 6: axial arterial · axial · arterial · 0.94mm/px · z∈[-326,-155]mm · 4 of 96 slices shown, 9 images]
[im 20/96  soft-tissue]
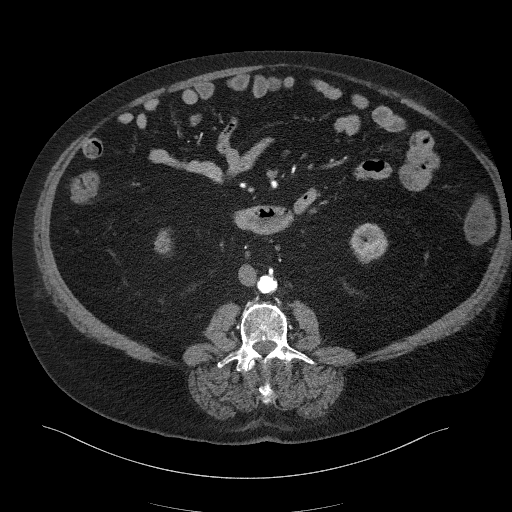
[im 20/96  lung]
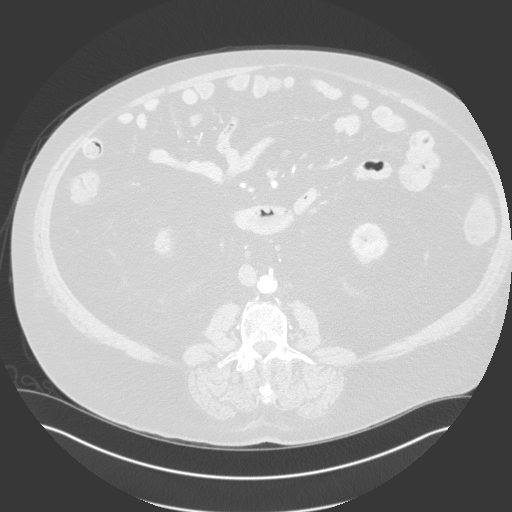
[im 20/96  bone]
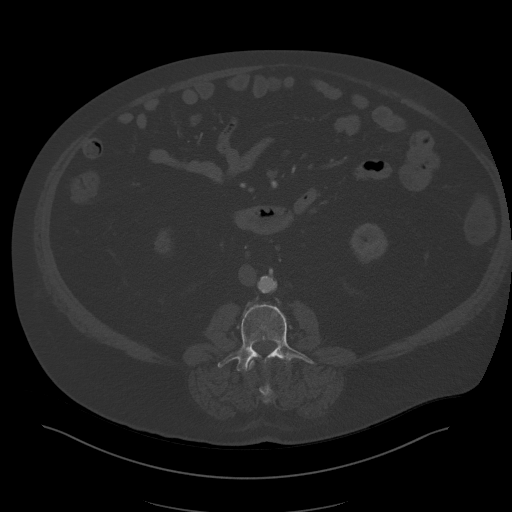
[im 39/96  soft-tissue]
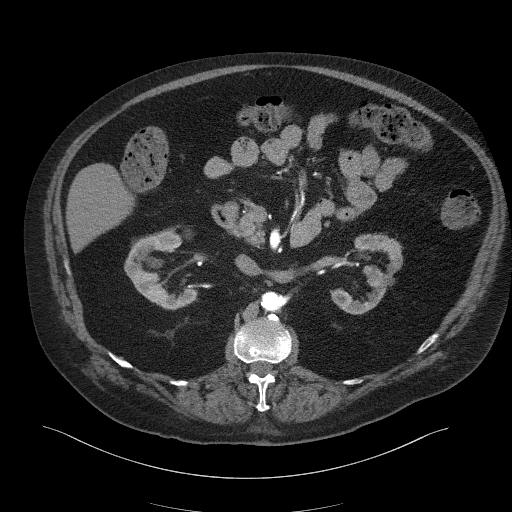
[im 39/96  lung]
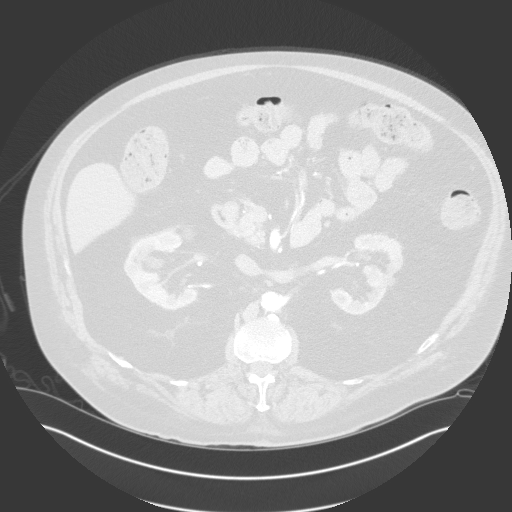
[im 58/96  soft-tissue]
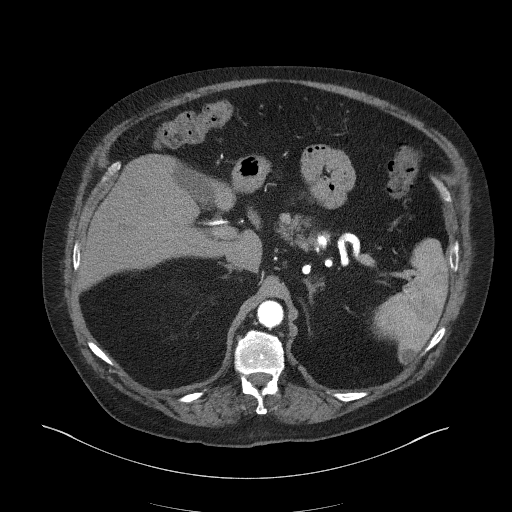
[im 58/96  lung]
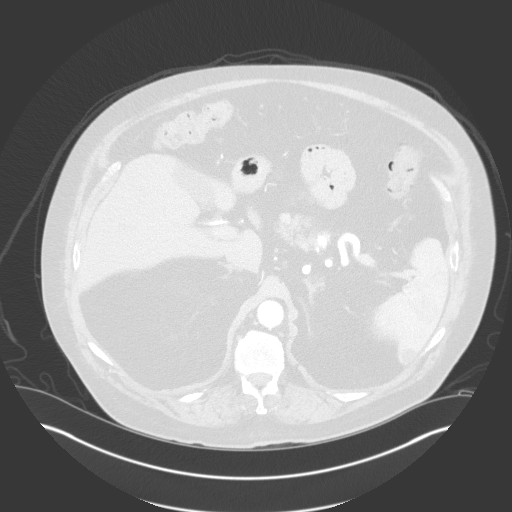
[im 77/96  soft-tissue]
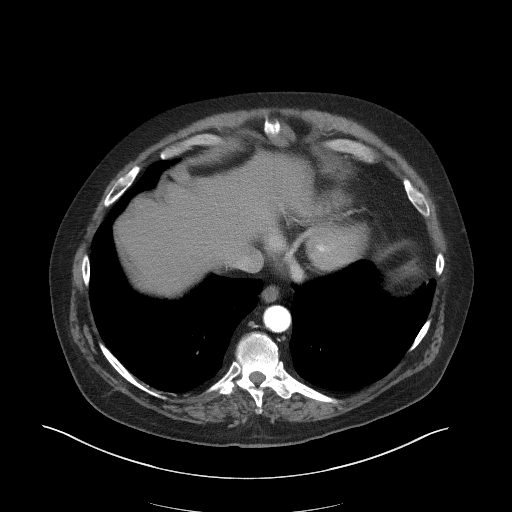
[im 77/96  lung]
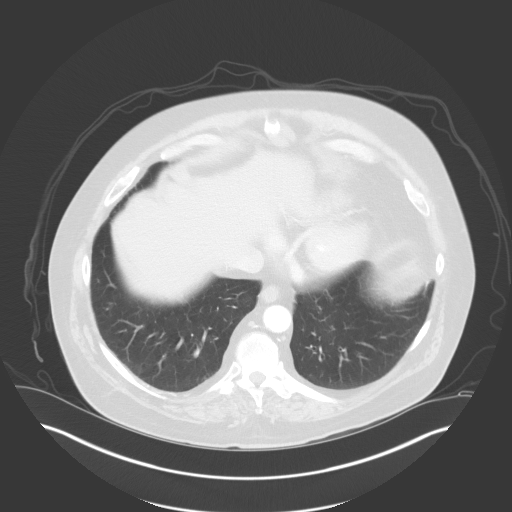

[Series 11: axial nephro · axial · 0.94mm/px · z∈[-326,-155]mm · 4 of 96 slices shown]
[im 20/96  soft-tissue]
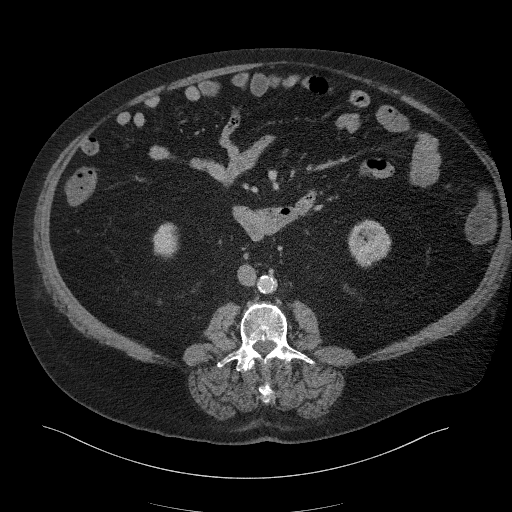
[im 39/96  soft-tissue]
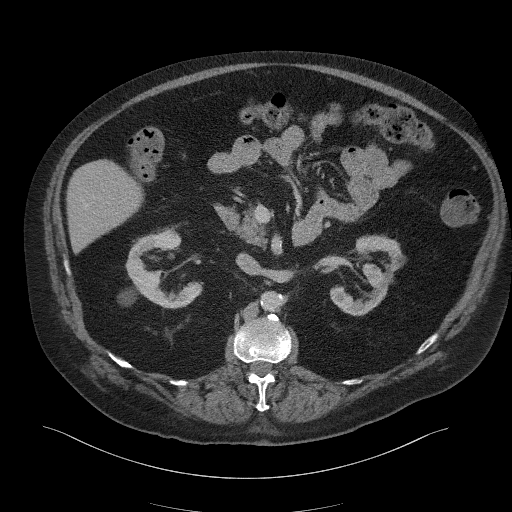
[im 58/96  soft-tissue]
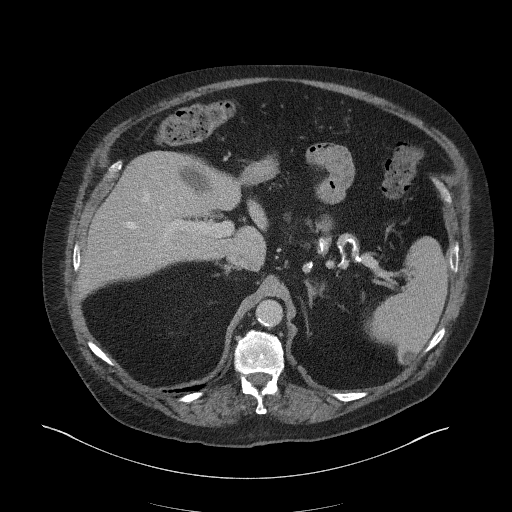
[im 77/96  soft-tissue]
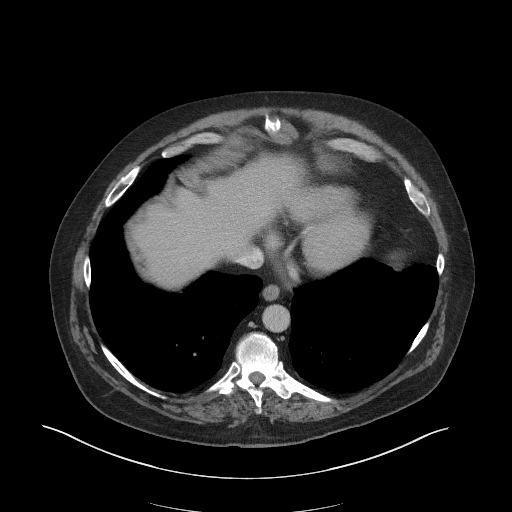

[11 of 46 positions shown; findings below may reference images not displayed]

FINDINGS: Lower chest: No acute abnormality.

Hepatobiliary: No suspicious hepatic lesion. Gallbladder is
unremarkable. No biliary ductal dilation.

Pancreas: Within normal limits.

Spleen: Normal size spleen. Hypodense 1.1 cm nonenhancing splenic
lesion is stable dating back to at least June 06, 2017, consistent
with a benign etiology such as a cyst.

Adrenals/Urinary Tract: Bilateral adrenal glands are unremarkable.

No hydronephrosis.

Stable changes of prior left renal cryoablation with the treatment
zone measuring 1.7 x 1.0 cm, unchanged. Similar appearance of the
adjacent fat stranding, typical for at lesion change. No solid
enhancing nodularity in the treatment bed.

Unchanged size of the small 1 cm right interpolar renal lesion on
image 62/6 which does not demonstrate definite postcontrast
enhancement but assessment of which is limited by small size.

Bilateral renal cysts.

Stomach/Bowel: Stomach is within normal limits. No evidence of bowel
wall thickening, distention, or inflammatory changes.

Vascular/Lymphatic: Aortic atherosclerosis without abdominal aortic
aneurysm. No pathologically enlarged abdominal lymph nodes.

Other: No abdominal ascites.

Musculoskeletal: Multilevel degenerative changes spine. No
aggressive lytic or blastic lesion of bone.
IMPRESSION: 1. Stable changes of prior left renal cryoablation. No evidence of
recurrent or metastatic disease within the abdomen.
2. Unchanged size of the small right interpolar renal lesion, which
does not demonstrate definite postcontrast enhancement. Possibly
representing a hemorrhagic or proteinaceous cyst but incompletely
evaluated given its small size. Continued attention on follow-up
imaging is suggested.
3.  Aortic Atherosclerosis (OIM3P-INZ.Z).

## 2022-01-24 ENCOUNTER — Other Ambulatory Visit: Payer: Self-pay

## 2022-01-24 MED ORDER — COMIRNATY 30 MCG/0.3ML IM SUSY
0.3000 mL | PREFILLED_SYRINGE | Freq: Once | INTRAMUSCULAR | 0 refills | Status: AC
  Filled 2022-01-24: qty 0.3, 1d supply, fill #0

## 2022-01-28 ENCOUNTER — Observation Stay (HOSPITAL_COMMUNITY)
Admission: EM | Admit: 2022-01-28 | Discharge: 2022-01-30 | Disposition: A | Discharge status: Home Health | Payer: Medicare Other | Attending: Internal Medicine | Admitting: Internal Medicine

## 2022-01-28 ENCOUNTER — Emergency Department (HOSPITAL_COMMUNITY): Payer: Medicare Other

## 2022-01-28 ENCOUNTER — Other Ambulatory Visit: Payer: Self-pay

## 2022-01-28 ENCOUNTER — Encounter (HOSPITAL_COMMUNITY): Payer: Self-pay

## 2022-01-28 DIAGNOSIS — I4819 Other persistent atrial fibrillation: Secondary | ICD-10-CM | POA: Insufficient documentation

## 2022-01-28 DIAGNOSIS — Z7982 Long term (current) use of aspirin: Secondary | ICD-10-CM | POA: Diagnosis not present

## 2022-01-28 DIAGNOSIS — Z85528 Personal history of other malignant neoplasm of kidney: Secondary | ICD-10-CM | POA: Insufficient documentation

## 2022-01-28 DIAGNOSIS — J449 Chronic obstructive pulmonary disease, unspecified: Secondary | ICD-10-CM | POA: Diagnosis not present

## 2022-01-28 DIAGNOSIS — E890 Postprocedural hypothyroidism: Secondary | ICD-10-CM | POA: Insufficient documentation

## 2022-01-28 DIAGNOSIS — Z7901 Long term (current) use of anticoagulants: Secondary | ICD-10-CM | POA: Diagnosis not present

## 2022-01-28 DIAGNOSIS — I10 Essential (primary) hypertension: Secondary | ICD-10-CM

## 2022-01-28 DIAGNOSIS — G939 Disorder of brain, unspecified: Principal | ICD-10-CM | POA: Insufficient documentation

## 2022-01-28 DIAGNOSIS — Z87891 Personal history of nicotine dependence: Secondary | ICD-10-CM | POA: Insufficient documentation

## 2022-01-28 DIAGNOSIS — R42 Dizziness and giddiness: Secondary | ICD-10-CM

## 2022-01-28 DIAGNOSIS — I129 Hypertensive chronic kidney disease with stage 1 through stage 4 chronic kidney disease, or unspecified chronic kidney disease: Secondary | ICD-10-CM | POA: Insufficient documentation

## 2022-01-28 DIAGNOSIS — G4733 Obstructive sleep apnea (adult) (pediatric): Secondary | ICD-10-CM

## 2022-01-28 DIAGNOSIS — N1832 Chronic kidney disease, stage 3b: Secondary | ICD-10-CM | POA: Insufficient documentation

## 2022-01-28 DIAGNOSIS — G9389 Other specified disorders of brain: Secondary | ICD-10-CM

## 2022-01-28 DIAGNOSIS — K219 Gastro-esophageal reflux disease without esophagitis: Secondary | ICD-10-CM | POA: Diagnosis present

## 2022-01-28 DIAGNOSIS — Z79899 Other long term (current) drug therapy: Secondary | ICD-10-CM | POA: Diagnosis not present

## 2022-01-28 DIAGNOSIS — I161 Hypertensive emergency: Secondary | ICD-10-CM | POA: Diagnosis not present

## 2022-01-28 DIAGNOSIS — Z7984 Long term (current) use of oral hypoglycemic drugs: Secondary | ICD-10-CM | POA: Insufficient documentation

## 2022-01-28 DIAGNOSIS — E1122 Type 2 diabetes mellitus with diabetic chronic kidney disease: Secondary | ICD-10-CM | POA: Insufficient documentation

## 2022-01-28 LAB — ETHANOL: Alcohol, Ethyl (B): <10 mg/dL (ref <10)

## 2022-01-28 LAB — RAPID URINE DRUG SCREEN, HOSP PERFORMED
Amphetamines: NOT DETECTED
Barbiturates: NOT DETECTED
Benzodiazepines: NOT DETECTED
Cocaine: NOT DETECTED
Opiates: NOT DETECTED
Tetrahydrocannabinol: NOT DETECTED

## 2022-01-28 LAB — URINALYSIS, ROUTINE W REFLEX MICROSCOPIC
Bacteria, UA: NONE SEEN
Bilirubin Urine: NEGATIVE
Glucose, UA: 50 mg/dL — AB
Ketones, ur: NEGATIVE mg/dL
Leukocytes,Ua: NEGATIVE
Nitrite: NEGATIVE
Protein, ur: 100 mg/dL — AB
Specific Gravity, Urine: 1.015 (ref 1.005–1.030)
pH: 5 (ref 5.0–8.0)

## 2022-01-28 LAB — CBC
HCT: 37.8 % — ABNORMAL LOW (ref 39.0–52.0)
Hemoglobin: 12.6 g/dL — ABNORMAL LOW (ref 13.0–17.0)
MCH: 31.8 pg (ref 26.0–34.0)
MCHC: 33.3 g/dL (ref 30.0–36.0)
MCV: 95.5 fL (ref 80.0–100.0)
Platelets: 175 10*3/uL (ref 150–400)
RBC: 3.96 MIL/uL — ABNORMAL LOW (ref 4.22–5.81)
RDW: 12.5 % (ref 11.5–15.5)
WBC: 4.9 10*3/uL (ref 4.0–10.5)
nRBC: 0 % (ref 0.0–0.2)

## 2022-01-28 LAB — I-STAT CHEM 8, ED
BUN: 34 mg/dL — ABNORMAL HIGH (ref 8–23)
Calcium, Ion: 1.14 mmol/L — ABNORMAL LOW (ref 1.15–1.40)
Chloride: 105 mmol/L (ref 98–111)
Creatinine, Ser: 1.9 mg/dL — ABNORMAL HIGH (ref 0.61–1.24)
Glucose, Bld: 168 mg/dL — ABNORMAL HIGH (ref 70–99)
HCT: 37 % — ABNORMAL LOW (ref 39.0–52.0)
Hemoglobin: 12.6 g/dL — ABNORMAL LOW (ref 13.0–17.0)
Potassium: 5.3 mmol/L — ABNORMAL HIGH (ref 3.5–5.1)
Sodium: 140 mmol/L (ref 135–145)
TCO2: 26 mmol/L (ref 22–32)

## 2022-01-28 LAB — PROTIME-INR
INR: 1.5 — ABNORMAL HIGH (ref 0.8–1.2)
Prothrombin Time: 17.9 seconds — ABNORMAL HIGH (ref 11.4–15.2)

## 2022-01-28 LAB — MAGNESIUM: Magnesium: 1.6 mg/dL — ABNORMAL LOW (ref 1.7–2.4)

## 2022-01-28 LAB — APTT: aPTT: 27 seconds (ref 24–36)

## 2022-01-28 LAB — BASIC METABOLIC PANEL
Anion gap: 10 (ref 5–15)
BUN: 24 mg/dL — ABNORMAL HIGH (ref 8–23)
CO2: 24 mmol/L (ref 22–32)
Calcium: 8.9 mg/dL (ref 8.9–10.3)
Chloride: 105 mmol/L (ref 98–111)
Creatinine, Ser: 1.92 mg/dL — ABNORMAL HIGH (ref 0.61–1.24)
GFR, Estimated: 36 mL/min — ABNORMAL LOW (ref >60)
Glucose, Bld: 170 mg/dL — ABNORMAL HIGH (ref 70–99)
Potassium: 5 mmol/L (ref 3.5–5.1)
Sodium: 139 mmol/L (ref 135–145)

## 2022-01-28 LAB — TROPONIN I (HIGH SENSITIVITY)
Troponin I (High Sensitivity): 16 ng/L (ref <18)
Troponin I (High Sensitivity): 17 ng/L (ref <18)

## 2022-01-28 LAB — BRAIN NATRIURETIC PEPTIDE: B Natriuretic Peptide: 146 pg/mL — ABNORMAL HIGH (ref 0.0–100.0)

## 2022-01-28 MED ORDER — CARVEDILOL 3.125 MG PO TABS: 3.1250 mg | ORAL_TABLET | Freq: Two times a day (BID) | ORAL | Status: DC

## 2022-01-28 MED ORDER — FUROSEMIDE 10 MG/ML IJ SOLN: 40.0000 mg | Freq: Once | INTRAMUSCULAR | Status: DC

## 2022-01-28 MED ORDER — ONDANSETRON HCL 4 MG/2ML IJ SOLN
4.0000 mg | Freq: Once | INTRAMUSCULAR | Status: AC
  Administered 2022-01-28: 4 mg via INTRAVENOUS
  Filled 2022-01-28: qty 2

## 2022-01-28 MED ORDER — MAGNESIUM OXIDE -MG SUPPLEMENT 400 (240 MG) MG PO TABS
400.0000 mg | ORAL_TABLET | Freq: Once | ORAL | Status: AC
  Administered 2022-01-28: 400 mg via ORAL
  Filled 2022-01-28: qty 1

## 2022-01-28 MED ORDER — SODIUM CHLORIDE 0.9 % IV BOLUS
1000.0000 mL | Freq: Once | INTRAVENOUS | Status: AC
  Administered 2022-01-28: 1000 mL via INTRAVENOUS

## 2022-01-28 MED ORDER — DILTIAZEM HCL ER COATED BEADS 180 MG PO CP24
180.0000 mg | ORAL_CAPSULE | Freq: Once | ORAL | Status: AC
  Administered 2022-01-28: 180 mg via ORAL
  Filled 2022-01-28: qty 1

## 2022-01-28 MED ORDER — MECLIZINE HCL 25 MG PO TABS
25.0000 mg | ORAL_TABLET | Freq: Once | ORAL | Status: AC
  Administered 2022-01-28: 25 mg via ORAL
  Filled 2022-01-28: qty 1

## 2022-01-28 MED ORDER — DEXAMETHASONE SODIUM PHOSPHATE 4 MG/ML IJ SOLN
4.0000 mg | Freq: Four times a day (QID) | INTRAMUSCULAR | Status: DC
  Administered 2022-01-29 – 2022-01-30 (×7): 4 mg via INTRAVENOUS
  Filled 2022-01-28 (×8): qty 1

## 2022-01-28 MED ORDER — RIVAROXABAN 10 MG PO TABS: 20.0000 mg | ORAL_TABLET | Freq: Once | ORAL | Status: DC

## 2022-01-28 NOTE — ED Notes (Signed)
Patient to MRI.

## 2022-01-28 NOTE — ED Provider Notes (Cosign Needed Addendum)
[facility] EMERGENCY DEPARTMENT Provider Note   CSN: [id number] Arrival date & time: 01/28/22  1817     History  Chief Complaint  Patient presents with   Dizziness    Patrick Rocha is a 76 y.o. male with a past medical history of hypertension and A-fib presenting today with concern for dizziness and nausea.  He reports that around 215 this afternoon he started to feel dizzy.  It started shortly after bending over to pick something up.  At the same time he became nauseated.  He says that when his eyes are open it feels like the room is spinning but when he closes his eyes it feels like his brain is spinning.  No history of vertigo.  Denies any aphasia, facial droop, weakness, confusion or other symptoms.   Dizziness Associated symptoms: nausea   Associated symptoms: no headaches, no vomiting and no weakness        Home Medications Prior to Admission medications   Medication Sig Start Date End Date Taking? Authorizing Provider  aspirin 325 MG tablet Take 325 mg by mouth daily.    [provider]  atorvastatin (LIPITOR) 40 MG tablet Take 40 mg by mouth daily.    [provider]  benazepril (LOTENSIN) 40 MG tablet Take 20 mg by mouth daily.     [provider]  carvedilol (COREG) 3.125 MG tablet Take 3.125 mg by mouth 2 (two) times daily with a meal.    [provider]  Cinnamon 500 MG capsule Take 500 mg by mouth daily. Takes 2 tablet po daily    [provider]  diltiazem (DILTIAZEM CD) 180 MG 24 hr capsule Take 180 mg by mouth 2 (two) times daily.    [provider]  escitalopram (LEXAPRO) 10 MG tablet Take 10 mg by mouth daily.    [provider]  fenofibrate (TRICOR) 145 MG tablet Take 145 mg by mouth daily.    [provider]  ferrous sulfate 325 (65 FE) MG tablet Take 325 mg by mouth 2 (two) times daily with a meal.    [provider]  fexofenadine (ALLEGRA) 180 MG  tablet Take 180 mg by mouth 2 (two) times daily.    [provider]  furosemide (LASIX) 20 MG tablet Take 20 mg by mouth daily.    [provider]  glipiZIDE (GLUCOTROL) 5 MG tablet Take 5 mg by mouth daily before breakfast.    [provider]  HYDROcodone-acetaminophen (NORCO/VICODIN) 5-325 MG tablet Take 1 tablet by mouth every 6 (six) hours as needed for moderate pain. 09/14/18   Milton Ferguson, MD  ketoconazole (NIZORAL) 2 % cream Apply 1 Application topically daily. 09/02/21   Margarita Mail, PA-C  levothyroxine (SYNTHROID, LEVOTHROID) 150 MCG tablet Take 150 mcg by mouth daily before breakfast.    [provider]  metFORMIN (GLUCOPHAGE) 1000 MG tablet Take 1,000 mg by mouth 2 (two) times daily with a meal.    [provider]  Omega-3 Fatty Acids (FISH OIL) 1000 MG CAPS Take 1,000 mg by mouth 3 (three) times daily.    [provider]  pantoprazole (PROTONIX) 40 MG tablet Take 40 mg by mouth 2 (two) times daily.    [provider]  pioglitazone (ACTOS) 15 MG tablet Take 15 mg by mouth daily.    [provider]  rivaroxaban (XARELTO) 20 MG TABS tablet Take 20 mg by mouth daily with supper.    [provider]  tamsulosin (  FLOMAX) 0.4 MG CAPS capsule Take 0.4 mg by mouth daily.    [provider]  traMADol (ULTRAM) 50 MG tablet Take 1 tablet (50 mg total) by mouth every 6 (six) hours as needed. 09/02/21   Margarita Mail, PA-C  triamcinolone cream (KENALOG) 0.1 % Apply 1 application topically daily.    [provider]      Allergies    Patient has no known allergies.    Review of Systems   Review of Systems  Gastrointestinal:  Positive for nausea. Negative for vomiting.  Neurological:  Positive for dizziness and light-headedness. Negative for syncope, weakness, numbness and headaches.  Psychiatric/Behavioral:  Negative for confusion.     Physical Exam Updated Vital Signs BP (!) 215/80 (BP  Location: Right Arm)   Pulse 67   Temp 98.2 F (36.8 C) (Oral)   Ht '5\' 9"'$  (1.753 m)   Wt 127 kg   SpO2 98%   BMI 41.35 kg/m  Physical Exam Vitals and nursing note reviewed.  Constitutional:      General: He is not in acute distress.    Appearance: Normal appearance. He is not ill-appearing.  HENT:     Head: Normocephalic and atraumatic.     Mouth/Throat:     Mouth: Mucous membranes are moist.     Pharynx: Oropharynx is clear.  Eyes:     General: No scleral icterus.    Comments: Left-sided bloodshot conjunctivae-subconjunctival hemorrhage   Cardiovascular:     Rate and Rhythm: Normal rate and regular rhythm.     Pulses: Normal pulses.     Comments: PVCs Pulmonary:     Effort: Pulmonary effort is normal. No respiratory distress.  Skin:    General: Skin is warm and dry.     Findings: No rash.  Neurological:     General: No focal deficit present.     Mental Status: He is alert and oriented to person, place, and time.     Cranial Nerves: No cranial nerve deficit.     Sensory: No sensory deficit.     Motor: No weakness.     Comments: Cranial nerves II through XII grossly intact.  Horizontal nystagmus noted in the left eye with lateral deviation.  No dysmetria.  No visual field deficit  Psychiatric:        Mood and Affect: Mood normal.        Behavior: Behavior normal.     ED Results / Procedures / Treatments   Labs (all labs ordered are listed, but only abnormal results are displayed) Labs Reviewed  BASIC METABOLIC PANEL - Abnormal; Notable for the following components:      Result Value   Glucose, Bld 170 (*)    BUN 24 (*)    Creatinine, Ser 1.92 (*)    GFR, Estimated 36 (*)    All other components within normal limits  CBC - Abnormal; Notable for the following components:   RBC 3.96 (*)    Hemoglobin 12.6 (*)    HCT 37.8 (*)    All other components within normal limits  MAGNESIUM - Abnormal; Notable for the following components:   Magnesium 1.6 (*)    All  other components within normal limits  BRAIN NATRIURETIC PEPTIDE - Abnormal; Notable for the following components:   B Natriuretic Peptide 146.0 (*)    All other components within normal limits  PROTIME-INR - Abnormal; Notable for the following components:   Prothrombin Time 17.9 (*)    INR 1.5 (*)  All other components within normal limits  URINALYSIS, ROUTINE W REFLEX MICROSCOPIC - Abnormal; Notable for the following components:   Glucose, UA 50 (*)    Hgb urine dipstick SMALL (*)    Protein, ur 100 (*)    All other components within normal limits  I-STAT CHEM 8, ED - Abnormal; Notable for the following components:   Potassium 5.3 (*)    BUN 34 (*)    Creatinine, Ser 1.90 (*)    Glucose, Bld 168 (*)    Calcium, Ion 1.14 (*)    Hemoglobin 12.6 (*)    HCT 37.0 (*)    All other components within normal limits  ETHANOL  APTT  RAPID URINE DRUG SCREEN, HOSP PERFORMED  TROPONIN I (HIGH SENSITIVITY)  TROPONIN I (HIGH SENSITIVITY)    EKG None  Radiology CT Head Wo Contrast  Result Date: 01/28/2022 CLINICAL DATA:  Dizziness and headaches, initial encounter EXAM: CT HEAD WITHOUT CONTRAST TECHNIQUE: Contiguous axial images were obtained from the base of the skull through the vertex without intravenous contrast. RADIATION DOSE REDUCTION: This exam was performed according to the departmental dose-optimization program which includes automated exposure control, adjustment of the mA and/or kV according to patient size and/or use of iterative reconstruction technique. COMPARISON:  None Available. FINDINGS: Brain: There is a geographic area of decreased attenuation along the inferior aspect of the right frontal lobe likely related to evolving infarct. No focus of hemorrhage is identified. No mass lesion is noted. Mild atrophic changes are seen. Vascular: No hyperdense vessel or unexpected calcification. Skull: Normal. Negative for fracture or focal lesion. Sinuses/Orbits: Opacification is noted  in the left maxillary, ethmoid and frontal sinuses. Other: None IMPRESSION: Area of decreased attenuation in the inferior aspect of the right frontal lobe extending inferiorly towards the right temporal lobe. Given the appearance this likely represents a subacute infarct. MRI would be helpful for further evaluation. Mild atrophic changes. Electronically Signed   By: Inez Catalina M.D.   On: 01/28/2022 20:29   DG Chest 1 View  Result Date: 01/28/2022 CLINICAL DATA:  Dizziness and weakness for 1 day EXAM: CHEST  1 VIEW COMPARISON:  09/14/2018 FINDINGS: The heart size and mediastinal contours are within normal limits. Both lungs are clear. The visualized skeletal structures are unremarkable. IMPRESSION: No active disease. Electronically Signed   By: Randa Ngo M.D.   On: 01/28/2022 19:31    Procedures .Critical Care  Performed by: Rhae Hammock, PA-C Authorized by: Rhae Hammock, PA-C   Critical care provider statement:    Critical care time (minutes):  30   Critical care time was exclusive of:  Separately billable procedures and treating other patients   Critical care was necessary to treat or prevent imminent or life-threatening deterioration of the following conditions:  CNS failure or compromise   Critical care was time spent personally by me on the following activities:  Development of treatment plan with patient or surrogate, discussions with consultants, evaluation of patient's response to treatment, obtaining history from patient or surrogate, re-evaluation of patient's condition, ordering and performing treatments and interventions, ordering and review of laboratory studies and ordering and review of radiographic studies   I assumed direction of critical care for this patient from another provider in my specialty: no     Care discussed with: admitting provider      Medications Ordered in ED Medications  magnesium oxide (MAG-OX) tablet 400 mg (has no administration in time  range)  carvedilol (COREG) tablet 3.125 mg (  has no administration in time range)  diltiazem (CARDIZEM CD) 24 hr capsule 180 mg (has no administration in time range)  meclizine (ANTIVERT) tablet 25 mg (has no administration in time range)  dexamethasone (DECADRON) injection 4 mg (has no administration in time range)  sodium chloride 0.9 % bolus 1,000 mL (1,000 mLs Intravenous New Bag/Given 01/28/22 2250)  ondansetron (ZOFRAN) injection 4 mg (4 mg Intravenous Given 01/28/22 2249)  meclizine (ANTIVERT) tablet 25 mg (25 mg Oral Given 01/28/22 2251)    ED Course/ Medical Decision Making/ A&P Clinical Course as of 01/28/22 2332  Fri Jan 28, 2022  2249 I spoke with MR by about patient's scan.  I told them that I had originally ordered an MRI with and without contrast but they seem to have discontinued it and made it without.  She said that there did not appear to be an indication for the with and without.  We discussed that it was for stroke versus brain mass however it was inappropriate for them to cancel my order.  They say that they will come and do the MRI with contrast ASAP. [MR]  2315 Patient updated about MRI at the bedside.  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  No family history of brain mass.  He does report feeling better after fluids, Zofran and meclizine [MR]    Clinical Course User Index [MR] Danzig Macgregor, Cecilio Asper, PA-C                           Medical Decision Making Amount and/or Complexity of Data Reviewed Labs: ordered. Radiology: ordered.  Risk OTC drugs. Prescription drug management. Decision regarding hospitalization.   76 year old male presenting today due to dizziness and nausea.  Differential includes but is not limited to CVA, BPPV, brain mass, atypical migraine, meningitis.   This is not an exhaustive differential.    Past Medical History / Co-morbidities / Social History: Type 2  diabetes, well-controlled, OSA on CPAP, hypertension and A-fib   Physical Exam: Pertinent physical exam findings include Sinus rhythm, multitude of PVCs  Lab Tests: I ordered, and personally interpreted labs.  The pertinent results include: Creatinine 1.92, GFR 36.  Appears to be around patient's baseline. Potassium 5.3    Imaging Studies: Head CT ordered by previous provider.  Radiology notes potential subacute infarct of the right frontal lobe.   Cardiac Monitoring:  The patient was maintained on a cardiac monitor.  I viewed and interpreted the cardiac monitored which showed an underlying rhythm of: Normal sinus with PVCs   Medications: I ordered medication including IV fluids, Zofran and meclizine. Reevaluation of the patient after these medicines showed that the patient improved.   Antihypertensives were originally deferred for permissive hypertension in the setting of CVA.  Home antihypertensives were ordered after brain mass on MRI   Consultations Obtained: I spoke with Dr. Leonel Ramsay with neurology and he recommends MRI brain with and without contrast for further evaluation for potential tumor.   I spoke with Dr. Reatha Armour with neurosurgery who would like to see the patient outpatient on Tuesday but will see him in his hospitalization if his symptoms are not under control.  MDM/Disposition: This is a 76 year old male presenting today due to dizziness and nausea that started acutely this afternoon.  No history of CVA or vertigo.  Neurologic exam is nonfocal without any acute findings.  CT  questions and right frontal lobe infarct.  Dr. Leonel Ramsay was more suspicious for a brain mass.  MRI was performed without contrast despite my order of with and without.  MRI without contrast revealing of brain mass.  Neurosurgery was consulted and said that the patient was a good candidate for following up outpatient if his symptoms are under control.  Despite fluid resuscitation and meclizine  patient continues to feel dizzy.  Unable to ambulate and will require admission to the hospital.  Dr. Reatha Armour will see the patient.  Patient also will be started on Decadron Q6.   Admit to Dr. Bridgett Larsson.     I discussed this case with my attending physician Dr. Philip Aspen who cosigned this note including patient's presenting symptoms, physical exam, and planned diagnostics and interventions. Attending physician stated agreement with plan or made changes to plan which were implemented.      Final Clinical Impression(s) / ED Diagnoses Final diagnoses:  Brain mass  Uncontrolled hypertension    Rx / DC Orders ED Discharge Orders     None      Admit to Dr. Lorinda Creed, [place], PA-C 01/28/22 2359    Fransico Meadow, MD 01/29/22 [phone]

## 2022-01-28 NOTE — ED Triage Notes (Signed)
Patient has been dizzy on and off through out the day, he bent over earlier and got very dizzy, thought he was going to start vomiting, a headache started. So he called for EMS.   Left eye is blood shot.   EMS gave Zofran BP 240/120 in route

## 2022-01-28 NOTE — ED Notes (Signed)
PA at bedside.

## 2022-01-29 ENCOUNTER — Encounter (HOSPITAL_COMMUNITY): Payer: Self-pay | Admitting: Internal Medicine

## 2022-01-29 ENCOUNTER — Observation Stay (HOSPITAL_COMMUNITY): Payer: Medicare Other

## 2022-01-29 DIAGNOSIS — I161 Hypertensive emergency: Secondary | ICD-10-CM | POA: Diagnosis not present

## 2022-01-29 DIAGNOSIS — I4819 Other persistent atrial fibrillation: Secondary | ICD-10-CM

## 2022-01-29 DIAGNOSIS — G4733 Obstructive sleep apnea (adult) (pediatric): Secondary | ICD-10-CM

## 2022-01-29 DIAGNOSIS — E1122 Type 2 diabetes mellitus with diabetic chronic kidney disease: Secondary | ICD-10-CM | POA: Diagnosis not present

## 2022-01-29 DIAGNOSIS — N1832 Chronic kidney disease, stage 3b: Secondary | ICD-10-CM

## 2022-01-29 DIAGNOSIS — G9389 Other specified disorders of brain: Secondary | ICD-10-CM

## 2022-01-29 DIAGNOSIS — K219 Gastro-esophageal reflux disease without esophagitis: Secondary | ICD-10-CM

## 2022-01-29 DIAGNOSIS — R42 Dizziness and giddiness: Secondary | ICD-10-CM | POA: Diagnosis not present

## 2022-01-29 DIAGNOSIS — N183 Chronic kidney disease, stage 3 unspecified: Secondary | ICD-10-CM

## 2022-01-29 LAB — CBG MONITORING, ED
Glucose-Capillary: 144 mg/dL — ABNORMAL HIGH (ref 70–99)
Glucose-Capillary: 256 mg/dL — ABNORMAL HIGH (ref 70–99)
Glucose-Capillary: 295 mg/dL — ABNORMAL HIGH (ref 70–99)
Glucose-Capillary: 207 mg/dL — ABNORMAL HIGH (ref 70–99)

## 2022-01-29 LAB — GLUCOSE, CAPILLARY: Glucose-Capillary: 279 mg/dL — ABNORMAL HIGH (ref 70–99)

## 2022-01-29 MED ORDER — ONDANSETRON HCL 4 MG PO TABS: 4.0000 mg | ORAL_TABLET | Freq: Four times a day (QID) | ORAL | Status: DC | PRN

## 2022-01-29 MED ORDER — CARVEDILOL 12.5 MG PO TABS
12.5000 mg | ORAL_TABLET | Freq: Two times a day (BID) | ORAL | Status: DC
  Administered 2022-01-29 – 2022-01-30 (×3): 12.5 mg via ORAL
  Filled 2022-01-29 (×3): qty 1

## 2022-01-29 MED ORDER — INSULIN ASPART 100 UNIT/ML IJ SOLN
0.0000 [IU] | Freq: Every day | INTRAMUSCULAR | Status: DC
  Administered 2022-01-29: 3 [IU] via SUBCUTANEOUS

## 2022-01-29 MED ORDER — INSULIN ASPART 100 UNIT/ML IJ SOLN
0.0000 [IU] | Freq: Three times a day (TID) | INTRAMUSCULAR | Status: DC
  Administered 2022-01-29: 8 [IU] via SUBCUTANEOUS
  Administered 2022-01-29: 5 [IU] via SUBCUTANEOUS
  Administered 2022-01-29 – 2022-01-30 (×2): 8 [IU] via SUBCUTANEOUS
  Administered 2022-01-30: 11 [IU] via SUBCUTANEOUS

## 2022-01-29 MED ORDER — PANTOPRAZOLE SODIUM 40 MG PO TBEC
40.0000 mg | DELAYED_RELEASE_TABLET | Freq: Two times a day (BID) | ORAL | Status: DC
  Administered 2022-01-29 – 2022-01-30 (×4): 40 mg via ORAL
  Filled 2022-01-29 (×3): qty 1

## 2022-01-29 MED ORDER — IOHEXOL 9 MG/ML PO SOLN
500.0000 mL | ORAL | Status: AC
  Administered 2022-01-29 (×2): 500 mL via ORAL

## 2022-01-29 MED ORDER — ONDANSETRON HCL 4 MG/2ML IJ SOLN: 4.0000 mg | Freq: Four times a day (QID) | INTRAMUSCULAR | Status: DC | PRN

## 2022-01-29 MED ORDER — MECLIZINE HCL 25 MG PO TABS: 25.0000 mg | ORAL_TABLET | Freq: Three times a day (TID) | ORAL | Status: DC | PRN

## 2022-01-29 MED ORDER — TAMSULOSIN HCL 0.4 MG PO CAPS
0.4000 mg | ORAL_CAPSULE | Freq: Every day | ORAL | Status: DC
  Administered 2022-01-29 – 2022-01-30 (×2): 0.4 mg via ORAL
  Filled 2022-01-29 (×2): qty 1

## 2022-01-29 MED ORDER — HYDRALAZINE HCL 20 MG/ML IJ SOLN
10.0000 mg | INTRAMUSCULAR | Status: DC | PRN
  Administered 2022-01-29: 10 mg via INTRAVENOUS
  Filled 2022-01-29: qty 1

## 2022-01-29 MED ORDER — GADOBUTROL 1 MMOL/ML IV SOLN
10.0000 mL | Freq: Once | INTRAVENOUS | Status: AC | PRN
  Administered 2022-01-29: 10 mL via INTRAVENOUS

## 2022-01-29 MED ORDER — DILTIAZEM HCL ER COATED BEADS 180 MG PO CP24
180.0000 mg | ORAL_CAPSULE | Freq: Two times a day (BID) | ORAL | Status: DC
  Administered 2022-01-30: 180 mg via ORAL
  Filled 2022-01-29: qty 1

## 2022-01-29 MED ORDER — IOHEXOL 350 MG/ML SOLN
75.0000 mL | Freq: Once | INTRAVENOUS | Status: AC | PRN
  Administered 2022-01-29: 75 mL via INTRAVENOUS

## 2022-01-29 MED ORDER — ACETAMINOPHEN 325 MG PO TABS: 650.0000 mg | ORAL_TABLET | Freq: Four times a day (QID) | ORAL | Status: DC | PRN

## 2022-01-29 MED ORDER — LEVOTHYROXINE SODIUM 75 MCG PO TABS
150.0000 ug | ORAL_TABLET | Freq: Every day | ORAL | Status: DC
  Administered 2022-01-29 – 2022-01-30 (×2): 150 ug via ORAL
  Filled 2022-01-29 (×2): qty 2

## 2022-01-29 MED ORDER — ATORVASTATIN CALCIUM 40 MG PO TABS
40.0000 mg | ORAL_TABLET | Freq: Every day | ORAL | Status: DC
  Administered 2022-01-29 – 2022-01-30 (×2): 40 mg via ORAL
  Filled 2022-01-29 (×2): qty 1

## 2022-01-29 MED ORDER — CLONIDINE HCL 0.1 MG PO TABS
0.1000 mg | ORAL_TABLET | Freq: Once | ORAL | Status: AC
  Administered 2022-01-29: 0.1 mg via ORAL
  Filled 2022-01-29: qty 1

## 2022-01-29 MED ORDER — ACETAMINOPHEN 650 MG RE SUPP: 650.0000 mg | Freq: Four times a day (QID) | RECTAL | Status: DC | PRN

## 2022-01-29 NOTE — Assessment & Plan Note (Signed)
Coincidentally, pt's vertigo started as soon as he bent over at the waist to pick up something off the floor. Pt could have two separate issues(BPPV and brain tumor) or they could be related. Will ask PT to perform Epley maneuver to see if this helps his vertigo.

## 2022-01-29 NOTE — Consult Note (Addendum)
   Providing Compassionate, Quality Care - Together  Neurosurgery Consult  Referring physician: ED Reason for referral: Brain tumor  Chief Complaint: Dizziness  History of Present Illness: This is a right-handed, 76 year old male, retired Industrial/product designer, with complaints of 1 day of severe dizziness whenever he would stand up or move.  He does have a history of hypertension, A-fib on Xarelto, CKD, diabetes, COPD.  He complains primarily of room spinning whenever he moves around or tries to sit up.  He called 911 due to this.  He denies any numbness tingling weakness speech difficulty or trauma.  He denies any history of cancer at this time.  CT brain was obtained in the ED which revealed concerning findings for tumor, MRI without contrast was obtained which again showed significant edema and likely inferior right frontal lobe and insular lesion.  He remained quite dizzy and therefore is being admitted to the hospital.   Medications: I have reviewed the patient's current medications. Allergies: No Known Allergies  History reviewed. No pertinent family history. Social History:  has no history on file for tobacco use, alcohol use, and drug use.  ROS: All pertinent positives and negatives are listed in HPI above  Physical Exam:  Vital signs in last 24 hours: Temp:  [98 F (36.7 C)-98.3 F (36.8 C)] 98 F (36.7 C) (07/25 1814) Pulse Rate:  [58-128] 65 (07/26 0746) Resp:  [11-18] 14 (07/26 0217) BP: (138-182)/(65-125) 153/88 (07/26 0700) SpO2:  [91 %-98 %] 96 % (07/26 0746) PE: Awake alert oriented x 3, no acute distress Speech fluent and appropriate Nonlabored breathing Obese Full strength in upper and lower extremities Sensory intact to light touch Cranial nerves II through XII intact   Impression/Assessment:  76 year old male with  Right frontal/insular lesion  Plan:  -Recommend MRI brain with and without contrast with stereotactic protocol -CT chest abdomen pelvis for  metastatic workup -I discussed extensively with the patient that likely he would need a biopsy once all the imaging is completed.  I do not believe he needs to stay hospitalized waiting for the biopsy sometime next week as he would need to be off anticoagulation as well.  He can be discharged home and follow-up with me next week as an outpatient once improved.  He can remain on a low-dose steroid, dexamethasone 2 mg 3 times daily upon discharge.  Thank you for allowing me to participate in this patient's care.  Please do not hesitate to call with questions or concerns.   Patrick Rocha, [facility]

## 2022-01-29 NOTE — H&P (Signed)
History and Physical    Jo Cerone NTI:[id number] DOB: 18-Jul-1945 DOA: 01/28/2022  DOS: the patient was seen and examined on 01/28/2022  PCP: Elliot Dally, MD   Patient coming from: Home  I have personally briefly reviewed patient's old medical records in [place]  CC: vertigo HPI: 76 year old Caucasian male history of morbid obesity BMI 41, history of A-fib on Xarelto, hypertension, CKD stage IIIb baseline creatinine 1.7-1.9, type 2 diabetes, COPD, reflux presents to the ER today with sudden onset of vertigo.  Patient states that at 2:15 PM today, he was bending over at the waist to pick up something off the floor.  He states that as soon as he bent over with his head upside down, he felt the room starts spinning.  He was able to stand up straight and sit down in a chair.  He spent the next 2 hours sitting in a chair with his head spinning.  He states that it got worse when he would open his eyes.  He had the feeling of vertigo.  He had called 911 after his vertigo did improve after 2 hours.  On arrival here to the ER, temp 90.2 heart rate 68 blood pressure 215/80 satting 98% on room air.  CT head showed decreased area of attenuation along the right frontal lobe possibly related to an infarct.  MRI brain was subsequently ordered.  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  There was associated edema with midline shift of 3 mm right to left.  Neurosurgery was consulted by the EDP.  Patient started on Decadron.  Patient still having vertiginous symptoms.  Tried hospitalist contacted for admission.  Neurosurgery will be seeing the patient in consult.     ED Course: MRI brain showed brain mass. EDP consulted neurosugery. Pt started on decadron.  Review of Systems:  Review of Systems  Constitutional: Negative.   HENT: Negative.    Eyes: Negative.   Respiratory:  Negative.    Cardiovascular: Negative.   Gastrointestinal: Negative.   Genitourinary: Negative.   Musculoskeletal: Negative.   Skin: Negative.   Neurological:  Positive for dizziness.       +vertigo  Endo/Heme/Allergies: Negative.   Psychiatric/Behavioral: Negative.    All other systems reviewed and are negative.   Past Medical History:  Diagnosis Date   Diabetes mellitus without complication ([place])    Hypertension 07/20/2010   renal ca    cryoablation left kidney 02/2016    Past Surgical History:  Procedure Laterality Date   IR GENERIC HISTORICAL  02/11/2016   IR RADIOLOGIST EVAL & MGMT 02/11/2016 Corrie Mckusick, DO [facility] INTERV RAD   IR RADIOLOGIST EVAL & MGMT  04/12/2016   IR RADIOLOGIST EVAL & MGMT  12/07/2016   IR RADIOLOGIST EVAL & MGMT  06/06/2017   IR RADIOLOGIST EVAL & MGMT  09/18/2018   IR RADIOLOGIST EVAL & MGMT  10/01/2019   IR RADIOLOGIST EVAL & MGMT  09/08/2020     reports that he has quit smoking. His smoking use included cigarettes. He has never used smokeless tobacco. He reports current alcohol use. He reports that he does not use drugs.  No Known Allergies  History reviewed. No pertinent family history.  Prior to Admission medications   Medication Sig Start Date End Date Taking? Authorizing Provider  aspirin EC 81 MG tablet Take 81 mg by mouth daily.   Yes [provider]  atorvastatin (LIPITOR) 40 MG tablet Take 40 mg by mouth daily.   Yes [provider]  benazepril (LOTENSIN) 40 MG tablet Take 20 mg by mouth daily.    Yes [provider]  carvedilol (COREG) 12.5 MG tablet Take 12.5 mg by mouth 2 (two) times daily with a meal.   Yes [provider]  Cinnamon 500 MG capsule Take 500 mg by mouth in the morning and at bedtime.   Yes [provider]  diltiazem (DILTIAZEM CD) 180 MG 24 hr capsule Take 180 mg by mouth 2 (two) times daily.   Yes [provider]  escitalopram (LEXAPRO) 10 MG tablet Take 10 mg by mouth  daily.   Yes [provider]  fenofibrate (TRICOR) 145 MG tablet Take 145 mg by mouth daily.   Yes [provider]  ferrous sulfate 325 (65 FE) MG tablet Take 325 mg by mouth 2 (two) times daily with a meal.   Yes [provider]  fexofenadine (ALLEGRA HIVES 24HR) 180 MG tablet Take 180 mg by mouth daily.   Yes [provider]  furosemide (LASIX) 20 MG tablet Take 20 mg by mouth daily as needed for edema.   Yes [provider]  glipiZIDE (GLUCOTROL) 10 MG tablet Take 10 mg by mouth daily before breakfast.   Yes [provider]  ketoconazole (NIZORAL) 2 % cream Apply 1 Application topically daily. Patient taking differently: Apply 1 Application topically daily as needed for irritation. 09/02/21  Yes Harris, Abigail, PA-C  levothyroxine (SYNTHROID, LEVOTHROID) 150 MCG tablet Take 150 mcg by mouth daily before breakfast.   Yes [provider]  metFORMIN (GLUCOPHAGE) 1000 MG tablet Take 1,000 mg by mouth 2 (two) times daily with a meal.   Yes [provider]  Omega-3 Fatty Acids (FISH OIL) 1200 MG CAPS Take 1,200 mg by mouth in the morning and at bedtime.   Yes [provider]  pantoprazole (PROTONIX) 40 MG tablet Take 40 mg by mouth 2 (two) times daily.   Yes [provider]  pioglitazone (ACTOS) 15 MG tablet Take 15 mg by mouth daily.   Yes [provider]  Rivaroxaban (XARELTO) 15 MG TABS tablet Take 15 mg by mouth daily with supper.   Yes [provider]  tamsulosin (FLOMAX) 0.4 MG CAPS capsule Take 0.4 mg by mouth daily.   Yes [provider]  triamcinolone cream (KENALOG) 0.1 % Apply 1 application  topically daily as needed (for itching).   Yes [provider]    Physical Exam: Vitals:   01/28/22 2345 01/29/22 0015 01/29/22 0030 01/29/22 0100  BP: (!) 202/89 (!) 184/78 (!) 193/94 (!) 206/90  Pulse: 80 77 76 90  Resp: '17 14 10 18  '$ Temp:      TempSrc:      SpO2: 98%  98% 99% 98%  Weight:      Height:        Physical Exam Vitals and nursing note reviewed.  Constitutional:      Appearance: Normal appearance.  HENT:     Head: Normocephalic and atraumatic.     Nose: Nose normal.  Cardiovascular:     Rate and Rhythm: Normal rate and regular rhythm.  Pulmonary:     Effort: Pulmonary effort is normal.     Breath sounds: Normal breath sounds.  Abdominal:     General: There is no distension.  Skin:    General: Skin is warm and dry.     Capillary Refill:  Capillary refill takes less than 2 seconds.  Neurological:     General: No focal deficit present.     Mental Status: He is alert and oriented to person, place, and time.      Labs on Admission: I have personally reviewed following labs and imaging studies  CBC: Recent Labs  Lab 01/28/22 2117 01/28/22 2145  WBC  --  4.9  HGB 12.6* 12.6*  HCT 37.0* 37.8*  MCV  --  95.5  PLT  --  474   Basic Metabolic Panel: Recent Labs  Lab 01/28/22 2117 01/28/22 2145  NA 140 139  K 5.3* 5.0  CL 105 105  CO2  --  24  GLUCOSE 168* 170*  BUN 34* 24*  CREATININE 1.90* 1.92*  CALCIUM  --  8.9  MG  --  1.6*   GFR: Estimated Creatinine Clearance: 43.8 mL/min (A) (by C-G formula based on SCr of 1.92 mg/dL (H)). Liver Function Tests: No results for input(s): "AST", "ALT", "ALKPHOS", "BILITOT", "PROT", "ALBUMIN" in the last 168 hours. No results for input(s): "LIPASE", "AMYLASE" in the last 168 hours. No results for input(s): "AMMONIA" in the last 168 hours. Coagulation Profile: Recent Labs  Lab 01/28/22 2042  INR 1.5*   Cardiac Enzymes: Recent Labs  Lab 01/28/22 2045 01/28/22 2145  TROPONINIHS 16 17   BNP (last 3 results) No results for input(s): "PROBNP" in the last 8760 hours. HbA1C: No results for input(s): "HGBA1C" in the last 72 hours. CBG: No results for input(s): "GLUCAP" in the last 168 hours. Lipid Profile: No results for input(s): "CHOL", "HDL", "LDLCALC", "TRIG", "CHOLHDL",  "LDLDIRECT" in the last 72 hours. Thyroid Function Tests: No results for input(s): "TSH", "T4TOTAL", "FREET4", "T3FREE", "THYROIDAB" in the last 72 hours. Anemia Panel: No results for input(s): "VITAMINB12", "FOLATE", "FERRITIN", "TIBC", "IRON", "RETICCTPCT" in the last 72 hours. Urine analysis:    Component Value Date/Time   COLORURINE YELLOW 01/28/2022 2042   APPEARANCEUR CLEAR 01/28/2022 2042   LABSPEC 1.015 01/28/2022 2042   PHURINE 5.0 01/28/2022 2042   GLUCOSEU 50 (A) 01/28/2022 2042   HGBUR SMALL (A) 01/28/2022 2042   BILIRUBINUR NEGATIVE 01/28/2022 2042   [place] NEGATIVE 01/28/2022 2042   PROTEINUR 100 (A) 01/28/2022 2042   NITRITE NEGATIVE 01/28/2022 2042   LEUKOCYTESUR NEGATIVE 01/28/2022 2042    Radiological Exams on Admission: I have personally reviewed images MR BRAIN WO CONTRAST  Result Date: 01/28/2022 CLINICAL DATA:  Follow-up examination for neuro deficit, stroke suspected. EXAM: MRI HEAD WITHOUT CONTRAST TECHNIQUE: Multiplanar, multiecho pulse sequences of the brain and surrounding structures were obtained without intravenous contrast. COMPARISON:  Prior CT from earlier the same day. FINDINGS: Brain: Cerebral volume within normal limits for age. Scattered patchy T2/FLAIR hyperintensity involving the periventricular and deep white matter both cerebral hemispheres, most consistent with chronic small vessel ischemic disease, moderately advanced in nature. No evidence for acute or subacute infarct. No areas of chronic cortical infarction. No acute or chronic intracranial blood products. Abnormal infiltrative T2/FLAIR signal abnormality FLAIR signal abnormality seen involving the anterior/inferior right frontal lobe, with involvement of the gyrus rectus and right insula. Somewhat cystic bubbly appearance seen within this region on T2 weighted sequence (series 5, image 15). 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 Exact measurements of this lesion somewhat difficult given the lack of IV contrast. Surrounding T2/FLAIR signal intensity could reflect edema and/or infiltrative tumor. Associated regional mass effect with localized 3 mm right-to-left shift.  No other visible mass lesion. No hydrocephalus or extra-axial fluid collection. Basilar cisterns remain widely patent. Sellar and suprasellar region within normal limits. Pituitary gland normal. Vascular: Major intracranial vascular flow voids are maintained. Skull and upper cervical spine: Craniocervical junction normal. Bone marrow signal intensity within normal limits. No focal marrow replacing lesion. No scalp soft tissue abnormality. Sinuses/Orbits: Patient status post bilateral ocular lens replacement. Chronic left frontoethmoidal and maxillary sinusitis noted. Trace bilateral mastoid effusions, of doubtful significance. Other: None. IMPRESSION: 1. Abnormal infiltrative T2/FLAIR signal abnormality involving the anterior/inferior right frontal lobe, with involvement of the gyrus rectus and right insula. 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 Surrounding T2/FLAIR signal intensity could reflect edema and/or infiltrative tumor. Associated regional mass effect with localized 3 mm right-to-left shift. 2. Underlying moderately advanced chronic microvascular ischemic disease. 3. Chronic left frontoethmoidal and maxillary sinusitis. Electronically Signed   By: Jeannine Boga M.D.   On: 01/28/2022 23:03   CT Head Wo Contrast  Result Date: 01/28/2022 CLINICAL DATA:  Dizziness and headaches, initial encounter EXAM: CT HEAD WITHOUT CONTRAST TECHNIQUE: Contiguous axial images were obtained from the base of the skull through the vertex without intravenous contrast. RADIATION DOSE REDUCTION: This exam was performed according to the departmental dose-optimization program which includes  automated exposure control, adjustment of the mA and/or kV according to patient size and/or use of iterative reconstruction technique. COMPARISON:  None Available. FINDINGS: Brain: There is a geographic area of decreased attenuation along the inferior aspect of the right frontal lobe likely related to evolving infarct. No focus of hemorrhage is identified. No mass lesion is noted. Mild atrophic changes are seen. Vascular: No hyperdense vessel or unexpected calcification. Skull: Normal. Negative for fracture or focal lesion. Sinuses/Orbits: Opacification is noted in the left maxillary, ethmoid and frontal sinuses. Other: None IMPRESSION: Area of decreased attenuation in the inferior aspect of the right frontal lobe extending inferiorly towards the right temporal lobe. Given the appearance this likely represents a subacute infarct. MRI would be helpful for further evaluation. Mild atrophic changes. Electronically Signed   By: Inez Catalina M.D.   On: 01/28/2022 20:29   DG Chest 1 View  Result Date: 01/28/2022 CLINICAL DATA:  Dizziness and weakness for 1 day EXAM: CHEST  1 VIEW COMPARISON:  09/14/2018 FINDINGS: The heart size and mediastinal contours are within normal limits. Both lungs are clear. The visualized skeletal structures are unremarkable. IMPRESSION: No active disease. Electronically Signed   By: Randa Ngo M.D.   On: 01/28/2022 19:31    EKG: My personal interpretation of EKG shows: NSR    Assessment/Plan Principal Problem:   Brain mass Active Problems:   Hypertensive emergency   Vertigo   Controlled type 2 diabetes mellitus with stage 3 chronic kidney disease, without long-term current use of insulin (HCC)   COPD (chronic obstructive pulmonary disease) (HCC)   Esophageal reflux   Morbid (severe) obesity due to excess calories (HCC)   Persistent atrial fibrillation (HCC)   Postablative hypothyroidism   OSA on CPAP   Stage 3b chronic kidney disease (CKD) (HCC) - baseline SCr  1.7-1.9    Assessment and Plan: * Brain mass Admit to med/tele bed. EDP has consulted neurosurgery. Continue with decadron q6h. May need additional BP and glucose control with addition of steroids. Will hold Xarelto in case pt needs brain biopsy.  Vertigo Coincidentally, pt's vertigo started as soon as he bent over at the waist to pick up  something off the floor. Pt could have two separate issues(BPPV and brain tumor) or they could be related. Will ask PT to perform Epley maneuver to see if this helps his vertigo.   Hypertensive emergency Will add IV hydralazine to help with his BP. May need to increase his coreg to 25 mg bid if BP remains an issue. Pt states his Lotensin dose decreased recently by his PCP due to CKD.  Stage 3b chronic kidney disease (CKD) (HCC) - baseline SCr 1.7-1.9 Stable.  OSA on CPAP Stable. Continue cpap.  Postablative hypothyroidism Stable. Check TSH. Continue synthroid 150 mcg daily.  Persistent atrial fibrillation ([place]) Continue coreg and cardizem cd. Will hold xarelto in case pt needs brain biopsy.  Morbid (severe) obesity due to excess calories (HCC) Chronic. BMI 43.8  Esophageal reflux Continue with bid protonix 40 mg   COPD (chronic obstructive pulmonary disease) (HCC) Stable.  Controlled type 2 diabetes mellitus with stage 3 chronic kidney disease, without long-term current use of insulin (HCC) Hold metformin and actos. Add SSI.   DVT prophylaxis: SCDs Code Status: Full Code Family Communication: no family at bedside  Disposition Plan: return home  Consults called: EDP has consulted neurosurgery Dr. Reatha Armour Admission status: Observation, Telemetry bed   Kristopher Oppenheim, DO [facility] 01/29/2022, 1:15 AM

## 2022-01-29 NOTE — Progress Notes (Signed)
Physical Therapy Treatment Patient Details Name: Osby Sweetin MRN: [id number] DOB: 08-Sep-1945 Today's Date: 01/29/2022   History of Present Illness 76 year old male admitted 12/22 with complaints of severe dizziness; found to have brain mass and Hypertensive emergency. PMHx: hypertension, A-fib on Xarelto, CKD, diabetes, COPD.    PT Comments    Patient positive for left horizontal canal cupulolithiasis. Several evidence based treatment techniques utilized, with temporary evolution of nystagmus however this reverted. Persistent apogeotropic nystagmus in sidelying positions. Semont and Gufoni maneuvers performed followed by BBQ roll when there was a mild conversion noted, however still showing signs that otoconia likely adherent to Lt cupula. Pt provided HEP for Gufoni to perform at home. HHPT for vestibular rehab until he is able to safely get to outpatient neuro rehab for vestibular treatment recommended.     01/29/22 0001  Symptom Behavior  Subjective history of current problem Pt bending over developed vertigo. Worse with head movements, taken to hospital, found lesion/mass on MRI  Type of Dizziness  Spinning;Vertigo  Frequency of Dizziness with movement  Duration of Dizziness constant if on sides  Symptom Nature Positional  Aggravating Factors Turning head quickly;Turning head sideways;Supine to sit;Rolling to right;Rolling to left;Forward bending  Relieving Factors Head stationary;Lying supine;Closing eyes;Slow movements  Progression of Symptoms No change since onset  History of similar episodes no  Oculomotor Exam  Oculomotor Alignment Normal  Ocular ROM wnl  Spontaneous Absent  Gaze-induced  Absent  Smooth Pursuits Saccades  Saccades Hypometric  Comment Lt eye red  Auditory  Comments Denies tinnitus  Positional Testing  Dix-Hallpike Dix-Hallpike Left;Dix-Hallpike Right  Sidelying Test Sidelying Right;Sidelying Left  Horizontal Canal Testing Horizontal Canal  Right;Horizontal Canal Left;Horizontal Canal Right Intensity;Horizontal Canal Left Intensity  Dix-Hallpike Right  Dix-Hallpike Right Duration neg  Dix-Hallpike Right Symptoms No nystagmus  Dix-Hallpike Left  Dix-Hallpike Left Duration neg  Dix-Hallpike Left Symptoms No nystagmus  Sidelying Right  Sidelying Right Duration 2+ min  Sidelying Right Symptoms Left nystagmus  Sidelying Left  Sidelying Left Duration 2+ min  Sidelying Left Symptoms Right nystagmus  Horizontal Canal Right  Horizontal Canal Right Duration 2+ min  Horizontal Canal Right Symptoms Ageotrophic  Horizontal Canal Left  Horizontal Canal Left Duration 2+ min  Horizontal Canal Left Symptoms Ageotrophic  Horizontal Canal Right Intensity  Horizontal Canal Right Intensity Severe  Right Intensity Comment faster in Rt side lying  Horizontal Canal Left Intensity  Horizontal Canal Left Intensity Moderate  Left Intensity Comment slower in left sidelying  Cognition  Cognition Orientation Level Oriented x 4  Positional Sensitivities  Supine to Sitting 3  Rolling Right 4  Rolling Left 4     Recommendations for follow up therapy are one component of a multi-disciplinary discharge planning process, led by the attending physician.  Recommendations may be updated based on patient status, additional functional criteria and insurance authorization.  Follow Up Recommendations  Home health PT (*Vestibular Rehab*)     Assistance Recommended at Discharge Intermittent Supervision/Assistance  Patient can return home with the following Assistance with cooking/housework;Assist for transportation;Help with stairs or ramp for entrance   Equipment Recommendations  Rolling walker (2 wheels)    Recommendations for Other Services       Precautions / Restrictions Restrictions Weight Bearing Restrictions: No     Mobility  Bed Mobility Overal bed mobility: Needs Assistance Bed Mobility: Supine to Sit, Sit to Supine     Supine to  sit: Supervision Sit to supine: Supervision   General bed mobility comments: Effortful but  capable to performing without physical assist. Supervision for safety.    Transfers Overall transfer level: Needs assistance Equipment used: Rolling walker (2 wheels), None Transfers: Sit to/from Stand Sit to Stand: Supervision           General transfer comment: supervision for safety multiple times with and without UE support    Ambulation/Gait Ambulation/Gait assistance: Supervision Gait Distance (Feet): 150 Feet Assistive device: Rolling walker (2 wheels) Gait Pattern/deviations: Step-through pattern Gait velocity: decreased Gait velocity interpretation: <1.8 ft/sec, indicate of risk for recurrent falls   General Gait Details: Supervision for safety, guarded, minimal head movements due to dizziness. No buckling or overt LOBcues for RW use.   Stairs             Wheelchair Mobility    Modified Rankin (Stroke Patients Only)       Balance Overall balance assessment: Mild deficits observed, not formally tested                                          Cognition Arousal/Alertness: Awake/alert Behavior During Therapy: WFL for tasks assessed/performed Overall Cognitive Status: Within Functional Limits for tasks assessed                                          Exercises      General Comments General comments (skin integrity, edema, etc.): See vestibular assessment      Pertinent Vitals/Pain Pain Assessment Pain Assessment: No/denies pain    Home Living Family/patient expects to be discharged to:: Private residence Living Arrangements: Alone Available Help at Discharge: Family;Available PRN/intermittently Type of Home: House Home Access: Stairs to enter Entrance Stairs-Rails: Left Entrance Stairs-Number of Steps: 3   Home Layout: One level Home Equipment: None      Prior Function            PT Goals (current goals  can now be found in the care plan section) Acute Rehab PT Goals Patient Stated Goal: Feel better PT Goal Formulation: With patient Time For Goal Achievement: 02/05/22 Potential to Achieve Goals: Good    Frequency    Min 4X/week      PT Plan      Co-evaluation              AM-PAC PT "6 Clicks" Mobility   Outcome Measure  Help needed turning from your back to your side while in a flat bed without using bedrails?: A Little Help needed moving from lying on your back to sitting on the side of a flat bed without using bedrails?: A Little Help needed moving to and from a bed to a chair (including a wheelchair)?: A Little Help needed standing up from a chair using your arms (e.g., wheelchair or bedside chair)?: A Little Help needed to walk in hospital room?: A Little Help needed climbing 3-5 steps with a railing? : A Little 6 Click Score: 18    End of Session Equipment Utilized During Treatment: Gait belt Activity Tolerance: Patient tolerated treatment well Patient left: in bed;with call bell/phone within reach Nurse Communication: Mobility status PT Visit Diagnosis: Unsteadiness on feet (R26.81);BPPV;Dizziness and giddiness (R42) BPPV - Right/Left : Left (Cupulolithiasis)     Time: 4098-1191 PT Time Calculation (min) (ACUTE ONLY): 27 min  Charges:   $Canalith Rep  Proc: 23-37 mins                     Candie Mile, [place], DPT Physical Therapist Acute Rehabilitation Services [facility]    Ellouise Newer 01/29/2022, 3:56 PM

## 2022-01-29 NOTE — Assessment & Plan Note (Signed)
Will add IV hydralazine to help with his BP. May need to increase his coreg to 25 mg bid if BP remains an issue. Pt states his Lotensin dose decreased recently by his PCP due to CKD.

## 2022-01-29 NOTE — Evaluation (Signed)
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 PMHx: hypertension, A-fib on Xarelto, CKD, diabetes, COPD.    PT Comments    Pt admitted with above diagnosis. Demonstrates mild instability with gait, no overt LOB, and improved control with use of RW for support. No physical assist required during evaluation. See next note for vestibular assessment. Pt currently with functional limitations due to the deficits listed below (see PT Problem List). Pt will benefit from skilled PT to increase their independence and safety with mobility to allow discharge to the venue listed below.      Recommendations for follow up therapy are one component of a multi-disciplinary discharge planning process, led by the attending physician.  Recommendations may be updated based on patient status, additional functional criteria and insurance authorization.  Follow Up Recommendations  Home health PT (*Vestibular Rehab*)     Assistance Recommended at Discharge Intermittent Supervision/Assistance  Patient can return home with the following Assistance with cooking/housework;Assist for transportation;Help with stairs or ramp for entrance   Equipment Recommendations  Rolling walker (2 wheels)    Recommendations for Other Services       Precautions / Restrictions Restrictions Weight Bearing Restrictions: No     Mobility  Bed Mobility Overal bed mobility: Needs Assistance Bed Mobility: Supine to Sit, Sit to Supine     Supine to sit: Supervision Sit to supine: Supervision   General bed mobility comments: Effortful but capable to performing without physical assist. Supervision for safety.    Transfers Overall transfer level: Needs assistance Equipment used: Rolling walker (2  wheels), None Transfers: Sit to/from Stand Sit to Stand: Supervision           General transfer comment: supervision for safety multiple times with and without UE support    Ambulation/Gait Ambulation/Gait assistance: Supervision Gait Distance (Feet): 150 Feet Assistive device: Rolling walker (2 wheels) Gait Pattern/deviations: Step-through pattern Gait velocity: decreased Gait velocity interpretation: <1.8 ft/sec, indicate of risk for recurrent falls   General Gait Details: Supervision for safety, guarded, minimal head movements due to dizziness. No buckling or overt LOBcues for RW use.   Stairs             Wheelchair Mobility    Modified Rankin (Stroke Patients Only)       Balance Overall balance assessment: Mild deficits observed, not formally tested                                          Cognition Arousal/Alertness: Awake/alert Behavior During Therapy: WFL for tasks assessed/performed Overall Cognitive Status: Within Functional Limits for tasks assessed                                          Exercises      General Comments General comments (skin integrity, edema, etc.): See vestibular assessment      Pertinent Vitals/Pain Pain Assessment Pain Assessment: No/denies pain    Home Living Family/patient expects to be discharged to:: Private residence Living Arrangements: Alone Available Help at Discharge: Family;Available PRN/intermittently Type of Home: House Home Access: Stairs to enter Entrance Stairs-Rails: Left Entrance Stairs-Number of  Steps: 3   Home Layout: One level Home Equipment: None      Prior Function            PT Goals (current goals can now be found in the care plan section) Acute Rehab PT Goals Patient Stated Goal: Feel better PT Goal Formulation: With patient Time For Goal Achievement: 02/05/22 Potential to Achieve Goals: Good    Frequency    Min 4X/week      PT Plan       Co-evaluation              AM-PAC PT "6 Clicks" Mobility   Outcome Measure  Help needed turning from your back to your side while in a flat bed without using bedrails?: A Little Help needed moving from lying on your back to sitting on the side of a flat bed without using bedrails?: A Little Help needed moving to and from a bed to a chair (including a wheelchair)?: A Little Help needed standing up from a chair using your arms (e.g., wheelchair or bedside chair)?: A Little Help needed to walk in hospital room?: A Little Help needed climbing 3-5 steps with a railing? : A Little 6 Click Score: 18    End of Session Equipment Utilized During Treatment: Gait belt Activity Tolerance: Patient tolerated treatment well Patient left: in bed;with call bell/phone within reach Nurse Communication: Mobility status PT Visit Diagnosis: Unsteadiness on feet (R26.81);BPPV;Dizziness and giddiness (R42) BPPV - Right/Left : Left (Cupulolithiasis)     Time: 6579-0383 PT Time Calculation (min) (ACUTE ONLY): 24 min  Charges:  $Gait Training: 8-22 mins                     Candie Mile, PT, DPT Physical Therapist Acute Rehabilitation Services [place] 01/29/2022, 3:50 PM

## 2022-01-29 NOTE — Assessment & Plan Note (Signed)
Chronic. BMI 43.8

## 2022-01-29 NOTE — Assessment & Plan Note (Signed)
Admit to med/tele bed. EDP has consulted neurosurgery. Continue with decadron q6h. May need additional BP and glucose control with addition of steroids. Will hold Xarelto in case pt needs brain biopsy.

## 2022-01-29 NOTE — ED Notes (Signed)
Pt remains in imaging.

## 2022-01-29 NOTE — Assessment & Plan Note (Signed)
Continue with bid protonix 40 mg

## 2022-01-29 NOTE — ED Notes (Signed)
PT updated on plan of care. NAD noted, pt reports vertigo. Pt denies any unmet needs at this time.

## 2022-01-29 NOTE — Assessment & Plan Note (Signed)
Stable

## 2022-01-29 NOTE — Assessment & Plan Note (Signed)
Hold metformin and actos. Add SSI.

## 2022-01-29 NOTE — Progress Notes (Signed)
Pt resting comfortably on home CPAP machine at this time.

## 2022-01-29 NOTE — Progress Notes (Signed)
Pt arrived to unit from ed via stretcher. Pt placed on telemetry. Denies pain. Skin intact. Belongings at bedside. Pt notified his son of location. All questions answered. Call bell within reach. Bed in lowest position and bed alarm on.

## 2022-01-29 NOTE — Assessment & Plan Note (Signed)
Stable. Check TSH. Continue synthroid 150 mcg daily.

## 2022-01-29 NOTE — Assessment & Plan Note (Signed)
Continue coreg and cardizem cd. Will hold xarelto in case pt needs brain biopsy.

## 2022-01-29 NOTE — Subjective & Objective (Signed)
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  Patient states that at 2:15 PM today, he was bending over at the waist to pick up something off the floor.  He states that as soon as he bent over with his head upside down, he felt the room starts spinning.  He was able to stand up straight and sit down in a chair.  He spent the next 2 hours sitting in a chair with his head spinning.  He states that it got worse when he would open his eyes.  He had the feeling of vertigo.  He had called 911 after his vertigo did improve after 2 hours.  On arrival here to the ER, temp 90.2 heart rate 68 blood pressure 215/80 satting 98% on room air.  CT head showed decreased area of attenuation along the right frontal lobe possibly related to an infarct.  MRI brain was subsequently ordered.  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  There was associated edema with midline shift of 3 mm right to left.  Neurosurgery was consulted by the EDP.  Patient started on Decadron.  Patient still having vertiginous symptoms.  Tried hospitalist contacted for admission.  Neurosurgery will be seeing the patient in consult.

## 2022-01-29 NOTE — Progress Notes (Signed)
Admitted early this AM.  Seen and examined, sitting up at bedside eating breakfast. Awaiting PT eval, as well as NSG consultation.

## 2022-01-29 NOTE — ED Notes (Signed)
Pt at bedside

## 2022-01-29 NOTE — Assessment & Plan Note (Signed)
Stable. Continue cpap.

## 2022-01-29 NOTE — ED Notes (Addendum)
Delaying contrast administration d/t pending MRI, so to not miss timing of contrast & CT

## 2022-01-30 DIAGNOSIS — G9389 Other specified disorders of brain: Secondary | ICD-10-CM | POA: Diagnosis not present

## 2022-01-30 LAB — GLUCOSE, CAPILLARY
Glucose-Capillary: 253 mg/dL — ABNORMAL HIGH (ref 70–99)
Glucose-Capillary: 382 mg/dL — ABNORMAL HIGH (ref 70–99)
Glucose-Capillary: 343 mg/dL — ABNORMAL HIGH (ref 70–99)

## 2022-01-30 MED ORDER — DEXAMETHASONE 2 MG PO TABS: 2.0000 mg | ORAL_TABLET | Freq: Three times a day (TID) | ORAL | 0 refills | Status: DC

## 2022-01-30 MED ORDER — ESCITALOPRAM OXALATE 10 MG PO TABS
10.0000 mg | ORAL_TABLET | Freq: Every day | ORAL | Status: DC
  Administered 2022-01-30: 10 mg via ORAL
  Filled 2022-01-30: qty 1

## 2022-01-30 MED ORDER — MECLIZINE HCL 25 MG PO TABS: 25.0000 mg | ORAL_TABLET | Freq: Three times a day (TID) | ORAL | 0 refills | Status: DC | PRN

## 2022-01-30 NOTE — Progress Notes (Signed)
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 PMHx: hypertension, A-fib on Xarelto, CKD, diabetes, COPD.    PT Comments    Patient stable for home today.  Able to walk hallway without RW and completed DGI with score 19/24 and not using RW.  Able to don slippers at EOB leaning to pull up and picked up item in hallway without LOB and only mild c/o dizziness.  Repeat test for horizontal canal revealed mild low intensity apogeotropic nystagmus in L sidelying, but no further treatment today as planned d/c and HHPT already set up.  RN aware.  Pt. Likely home in pm.    Recommendations for follow up therapy are one component of a multi-disciplinary discharge planning process, led by the attending physician.  Recommendations may be updated based on patient status, additional functional criteria and insurance authorization.  Follow Up Recommendations  Home health PT     Assistance Recommended at Discharge Intermittent Supervision/Assistance  Patient can return home with the following Assistance with cooking/housework;Assist for transportation;Help with stairs or ramp for entrance   Equipment Recommendations  Rolling walker (2 wheels)    Recommendations for Other Services       Precautions / Restrictions Precautions Precautions: Fall     Mobility  Bed Mobility Overal bed mobility: Modified Independent                  Transfers Overall transfer level: Needs assistance Equipment used: None Transfers: Sit to/from Stand Sit to Stand: Supervision           General transfer comment: did not use RW    Ambulation/Gait Ambulation/Gait assistance: Supervision, Independent Gait Distance (Feet): 200 Feet Assistive device: Rolling walker (2  wheels), None Gait Pattern/deviations: Step-through pattern, Wide base of support       General Gait Details: used walker initially then left in hallway; completed DGI   Stairs Stairs: Yes Stairs assistance: Supervision Stair Management: One rail Right, Alternating pattern, Forwards Number of Stairs: 3 General stair comments: used rails at my insistance since still sightly dizzy   Wheelchair Mobility    Modified Rankin (Stroke Patients Only)       Balance                                 Standardized Balance Assessment Standardized Balance Assessment : Dynamic Gait Index   Dynamic Gait Index Level Surface: Mild Impairment Change in Gait Speed: Mild Impairment Gait with Horizontal Head Turns: Normal Gait with Vertical Head Turns: Normal Gait and Pivot Turn: Normal Step Over Obstacle: Moderate Impairment Step Around Obstacles: Normal Steps: Mild Impairment Total Score: 19      Cognition Arousal/Alertness: Awake/alert Behavior During Therapy: WFL for tasks assessed/performed Overall Cognitive Status: Within Functional Limits for tasks assessed                                          Exercises      General Comments General comments (skin integrity, edema, etc.): completed horizontal canal test for BPPV, mild apogeotropic nystagmus very slow when in L sidelying (moving to R) though pt reports feels as if spinning L, did not re-treat due  to treated yesterday with much improvement and HHPT set up      Pertinent Vitals/Pain Pain Assessment Pain Assessment: No/denies pain    Home Living                          Prior Function            PT Goals (current goals can now be found in the care plan section) Progress towards PT goals: Progressing toward goals    Frequency    Min 4X/week      PT Plan Current plan remains appropriate    Co-evaluation              AM-PAC PT "6 Clicks" Mobility   Outcome  Measure  Help needed turning from your back to your side while in a flat bed without using bedrails?: None Help needed moving from lying on your back to sitting on the side of a flat bed without using bedrails?: None Help needed moving to and from a bed to a chair (including a wheelchair)?: None Help needed standing up from a chair using your arms (e.g., wheelchair or bedside chair)?: None Help needed to walk in hospital room?: None Help needed climbing 3-5 steps with a railing? : None 6 Click Score: 24    End of Session   Activity Tolerance: Patient tolerated treatment well Patient left: in bed;with call bell/phone within reach;with family/visitor present   PT Visit Diagnosis: Unsteadiness on feet (R26.81);BPPV;Dizziness and giddiness (R42) BPPV - Right/Left : Left     Time: 1761-6073 PT Time Calculation (min) (ACUTE ONLY): 18 min  Charges:  $Gait Training: 8-22 mins                     Magda Kiel, PT Acute Rehabilitation Services [phone] 01/30/2022    Patrick Rocha 01/30/2022, 11:27 AM

## 2022-01-30 NOTE — Discharge Summary (Signed)
[facility]  Physician Discharge Summary   Patient ID: Patrick Rocha MRN: [id number] DOB/AGE: [birth year] 76 y.o.  Admit date: 01/28/2022 Discharge date: 01/30/2022    PCP: Elliot Dally, MD  DISCHARGE DIAGNOSES:    Brain mass   Hypertensive emergency   Vertigo   Controlled type 2 diabetes mellitus with stage 3 chronic kidney disease, without long-term current use of insulin (HCC)   COPD (chronic obstructive pulmonary disease) (HCC)   Esophageal reflux   Morbid (severe) obesity due to excess calories (HCC)   Persistent atrial fibrillation (HCC)   Postablative hypothyroidism   OSA on CPAP   Stage 3b chronic kidney disease (CKD) (HCC) - baseline SCr 1.7-1.9   RECOMMENDATIONS FOR OUTPATIENT FOLLOW UP: Patient to follow-up with Dr. Reatha Armour in the next few days. Patient instructed to hold his anticoagulation due to possibility that he will require brain biopsy in the near future    Home Health: PT Equipment/Devices: None  CODE STATUS: Full code  DISCHARGE CONDITION: fair  Diet recommendation: As before  INITIAL HISTORY: 76 year old Caucasian male history of morbid obesity BMI 41, history of A-fib on Xarelto, hypertension, CKD stage IIIb baseline creatinine 1.7-1.9, type 2 diabetes, COPD, reflux presents to the ER today with sudden onset of vertigo.  Patient states that at 2:15 PM today, he was bending over at the waist to pick up something off the floor.  He states that as soon as he bent over with his head upside down, he felt the room starts spinning.  He was able to stand up straight and sit down in a chair.  He spent the next 2 hours sitting in a chair with his head spinning.  He states that it got worse when he would open his eyes.  He had the feeling of vertigo.  He had called 911 after his vertigo did improve after 2 hours.   On arrival to the ER, temp 90.2 heart rate 68 blood pressure 215/80 satting 98% on room air.   CT head showed decreased  area of attenuation along the right frontal lobe possibly related to an infarct.  MRI brain was subsequently ordered.   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  There was associated edema with midline shift of 3 mm right to left.   Neurosurgery was consulted by the EDP.  Patient started on Decadron.     Consultations: Neurosurgery  Procedures: None   HOSPITAL COURSE:   Brain mass Patient was seen by neurosurgery.  He will need brain biopsy.  This will be done in the next few days.  Neurosurgery has cleared the patient for discharge.  Anticoagulation to be placed on hold for now.  Decadron to be continued at discharge.  Patient is stable from a neurological standpoint.     Vertigo Coincidentally, pt's vertigo started as soon as he bent over at the waist to pick up something off the floor. Pt could have two separate issues(BPPV and brain tumor) or they could be related.  Patient was given meclizine with improvement in symptoms.  Seen by physical therapy.  Home health has been ordered.   Hypertensive emergency Improved after he was given IV hydralazine.  May continue with his home medications.     Stage 3b chronic kidney disease (CKD) (HCC) - baseline SCr 1.7-1.9   OSA on CPAP   Postablative hypothyroidism   Persistent atrial fibrillation ([place]) Continue coreg and cardizem  cd. Will hold xarelto in case pt needs brain biopsy.   Morbid (severe) obesity due to excess calories (HCC) Estimated body mass index is 40.57 kg/m as calculated from the following:   Height as of this encounter: '5\' 9"'$  (1.753 m).   Weight as of this encounter: 124.6 kg.   Esophageal reflux   COPD (chronic obstructive pulmonary disease) (HCC)   Controlled type 2 diabetes mellitus with stage 3 chronic kidney disease, without long-term current use of insulin (HCC) Anticipate some worsening in his glycemic control due  to steroids.  Continue to monitor CBGs at home.  Dose of dexamethasone has been decreased at discharge.  Patient is stable.  Ambulated again with physical therapy today.  Okay for discharge home with close follow-up with neurosurgery.  PERTINENT LABS:  The results of significant diagnostics from this hospitalization (including imaging, microbiology, ancillary and laboratory) are listed below for reference.    Labs:   Basic Metabolic Panel: Recent Labs  Lab 01/28/22 2117 01/28/22 2145  NA 140 139  K 5.3* 5.0  CL 105 105  CO2  --  24  GLUCOSE 168* 170*  BUN 34* 24*  CREATININE 1.90* 1.92*  CALCIUM  --  8.9  MG  --  1.6*    CBC: Recent Labs  Lab 01/28/22 2117 01/28/22 2145  WBC  --  4.9  HGB 12.6* 12.6*  HCT 37.0* 37.8*  MCV  --  95.5  PLT  --  175    BNP: BNP (last 3 results) Recent Labs    01/28/22 2145  BNP 146.0*     CBG: Recent Labs  Lab 01/29/22 1730 01/29/22 2106 01/30/22 0631 01/30/22 0834 01/30/22 1247  GLUCAP 207* 279* 253* 382* 343*     IMAGING STUDIES MR BRAIN W WO CONTRAST  Result Date: 01/29/2022 CLINICAL DATA:  Brain mass. EXAM: MRI HEAD WITHOUT AND WITH CONTRAST TECHNIQUE: Multiplanar, multiecho pulse sequences of the brain and surrounding structures were obtained without and with intravenous contrast. CONTRAST:  57m GADAVIST GADOBUTROL 1 MMOL/ML IV SOLN COMPARISON:  Noncontrast CT head and brain MRI 1 day prior. FINDINGS: Brain: As before, there is infiltrative masslike T2/FLAIR signal abnormality in the anteroinferior right frontal lobe with involvement of the orbital gyrus, gyrus rectus, and insula. There is an area of apparent cystic change within the signal abnormality measuring 2.4 cm x 1.5 cm (8-15). There is no diffusion restriction or abnormal enhancement. There is no intralesional hemorrhage. There is mild regional mass effect with trace midline shift, unchanged. Parenchymal volume is normal for age. The ventricles are normal in  size. Patchy FLAIR signal abnormality throughout the remainder of the supratentorial white matter is nonspecific but likely reflects sequela of chronic small-vessel ischemic change. There is no abnormal enhancement. Vascular: Normal flow voids. Skull and upper cervical spine: Normal marrow signal. Sinuses/Orbits: Chronic left maxillary sinusitis is again seen. Bilateral lens implants are in place. The globes and orbits are otherwise unremarkable. Other: None. IMPRESSION: No abnormal postcontrast enhancement of the infiltrative signal abnormality in the right anteroinferior frontal lobe. 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 Electronically Signed   By: PValetta MoleM.D.   On: 01/29/2022 21:05   CT CHEST ABDOMEN PELVIS W CONTRAST  Result Date: 01/29/2022 CLINICAL DATA:  Brain metastases with unknown primary; * Tracking Code: BO * EXAM: CT CHEST, ABDOMEN, AND PELVIS WITH CONTRAST TECHNIQUE: Multidetector CT imaging of the chest, abdomen and pelvis was performed following the standard protocol during bolus administration of intravenous contrast. RADIATION DOSE  REDUCTION: This exam was performed according to the departmental dose-optimization program which includes automated exposure control, adjustment of the mA and/or kV according to patient size and/or use of iterative reconstruction technique. CONTRAST:  60m OMNIPAQUE IOHEXOL 350 MG/ML SOLN COMPARISON:  CT abdomen dated September 13, 2021 FINDINGS: CT CHEST FINDINGS Cardiovascular: Normal heart size. Pericardial effusion normal caliber thoracic aorta with moderate atherosclerotic disease. Severe three-vessel coronary artery calcifications. Mediastinum/Nodes: Esophagus unremarkable. No pathologically enlarged lymph nodes seen in the chest. Lungs/Pleura: Central airways are patent. Mild paraseptal emphysema. No consolidation, pleural effusion or pneumothorax. Musculoskeletal: No aggressive appearing osseous lesions. CT ABDOMEN PELVIS FINDINGS  Hepatobiliary: No focal liver abnormality is seen. No gallstones, gallbladder wall thickening, or biliary dilatation. Pancreas: Unremarkable. No pancreatic ductal dilatation or surrounding inflammatory changes. Spleen: Normal in size without focal abnormality. Adrenals/Urinary Tract: Bilateral adrenal glands are unremarkable. No hydronephrosis. 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 Stable interpolar lesion of the right kidney measuring approximately 1 cm on series 3, image 70. Additional bilateral low-attenuation renal lesions, largest are compatible with simple cysts, others are too small to completely characterize., no specific follow-up imaging is recommended for this lesion. Bladder is unremarkable. Stomach/Bowel: Stomach is within normal limits. No evidence of bowel wall thickening, distention, or inflammatory changes. Vascular/Lymphatic: Aortic atherosclerosis. No enlarged abdominal or pelvic lymph nodes. Reproductive: Prostate is unremarkable. Other: Small right inguinal fat containing hernia. No abdominopelvic ascites. Musculoskeletal: No aggressive appearing osseous lesions IMPRESSION: 1. No evidence of primary or metastatic disease in the chest, abdomen or pelvis. 2. 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 3. Stable small hypodense lesion of the interpolar right kidney which has not demonstrated definite postcontrast enhancement on prior exams but is incompletely evaluated given small size. 4. Severe three-vessel coronary artery calcifications. 5. Aortic Atherosclerosis (ICD10-I70.0) and Emphysema (ICD10-J43.9). Electronically Signed   By: LYetta GlassmanM.D.   On: 01/29/2022 16:32   MR BRAIN WO CONTRAST  Result Date: 01/28/2022 CLINICAL DATA:  Follow-up examination for neuro deficit, stroke suspected. EXAM: MRI HEAD WITHOUT CONTRAST TECHNIQUE: Multiplanar, multiecho pulse sequences of the brain and surrounding  structures were obtained without intravenous contrast. COMPARISON:  Prior CT from earlier the same day. FINDINGS: Brain: Cerebral volume within normal limits for age. Scattered patchy T2/FLAIR hyperintensity involving the periventricular and deep white matter both cerebral hemispheres, most consistent with chronic small vessel ischemic disease, moderately advanced in nature. No evidence for acute or subacute infarct. No areas of chronic cortical infarction. No acute or chronic intracranial blood products. Abnormal infiltrative T2/FLAIR signal abnormality FLAIR signal abnormality seen involving the anterior/inferior right frontal lobe, with involvement of the gyrus rectus and right insula. Somewhat cystic bubbly appearance seen within this region on T2 weighted sequence (series 5, image 15). Finding is most concerning for an intra-axial mass, with appearance most characteristic of a primary CNS neoplasm. Exact measurements of this lesion somewhat difficult given the lack of IV contrast. Surrounding T2/FLAIR signal intensity could reflect edema and/or infiltrative tumor. Associated regional mass effect with localized 3 mm right-to-left shift. No other visible mass lesion. No hydrocephalus or extra-axial fluid collection. Basilar cisterns remain widely patent. Sellar and suprasellar region within normal limits. Pituitary gland normal. Vascular: Major intracranial vascular flow voids are maintained. Skull and upper cervical spine: Craniocervical junction normal. Bone marrow signal intensity within normal limits. No focal marrow replacing lesion. No scalp soft tissue abnormality. Sinuses/Orbits: Patient status post bilateral ocular lens  replacement. Chronic left frontoethmoidal and maxillary sinusitis noted. Trace bilateral mastoid effusions, of doubtful significance. Other: None. IMPRESSION: 1. Abnormal infiltrative T2/FLAIR signal abnormality involving the anterior/inferior right frontal lobe, with involvement of the  gyrus rectus and right insula. 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 Surrounding T2/FLAIR signal intensity could reflect edema and/or infiltrative tumor. Associated regional mass effect with localized 3 mm right-to-left shift. 2. Underlying moderately advanced chronic microvascular ischemic disease. 3. Chronic left frontoethmoidal and maxillary sinusitis. Electronically Signed   By: Jeannine Boga M.D.   On: 01/28/2022 23:03   CT Head Wo Contrast  Result Date: 01/28/2022 CLINICAL DATA:  Dizziness and headaches, initial encounter EXAM: CT HEAD WITHOUT CONTRAST TECHNIQUE: Contiguous axial images were obtained from the base of the skull through the vertex without intravenous contrast. RADIATION DOSE REDUCTION: This exam was performed according to the departmental dose-optimization program which includes automated exposure control, adjustment of the mA and/or kV according to patient size and/or use of iterative reconstruction technique. COMPARISON:  None Available. FINDINGS: Brain: There is a geographic area of decreased attenuation along the inferior aspect of the right frontal lobe likely related to evolving infarct. No focus of hemorrhage is identified. No mass lesion is noted. Mild atrophic changes are seen. Vascular: No hyperdense vessel or unexpected calcification. Skull: Normal. Negative for fracture or focal lesion. Sinuses/Orbits: Opacification is noted in the left maxillary, ethmoid and frontal sinuses. Other: None IMPRESSION: Area of decreased attenuation in the inferior aspect of the right frontal lobe extending inferiorly towards the right temporal lobe. Given the appearance this likely represents a subacute infarct. MRI would be helpful for further evaluation. Mild atrophic changes. Electronically Signed   By: Inez Catalina M.D.   On: 01/28/2022 20:29   DG Chest 1 View  Result Date: 01/28/2022 CLINICAL DATA:   Dizziness and weakness for 1 day EXAM: CHEST  1 VIEW COMPARISON:  09/14/2018 FINDINGS: The heart size and mediastinal contours are within normal limits. Both lungs are clear. The visualized skeletal structures are unremarkable. IMPRESSION: No active disease. Electronically Signed   By: Randa Ngo M.D.   On: 01/28/2022 19:31    DISCHARGE EXAMINATION: Vitals:   01/29/22 2300 01/30/22 0311 01/30/22 0826 01/30/22 1246  BP: (!) 171/77 (!) 163/94 (!) 149/61 (!) 163/88  Pulse: 82 83 85 92  Resp: '16 18 18   '$ Temp: 98 F (36.7 C) 97.8 F (36.6 C) 97.7 F (36.5 C)   TempSrc: Oral Oral Oral Oral  SpO2: 97% 94% 98%   Weight:      Height:       General appearance: Awake alert.  In no distress Resp: Clear to auscultation bilaterally.  Normal effort Cardio: S1-S2 is normal regular.  No S3-S4.  No rubs murmurs or bruit GI: Abdomen is soft.  Nontender nondistended.  Bowel sounds are present normal.  No masses organomegaly    DISPOSITION: Home  Discharge Instructions     Call MD for:  difficulty breathing, headache or visual disturbances   Complete by: As directed    Call MD for:  extreme fatigue   Complete by: As directed    Call MD for:  persistant dizziness or light-headedness   Complete by: As directed    Call MD for:  persistant nausea and vomiting   Complete by: As directed    Call MD for:  severe uncontrolled pain   Complete by: As directed    Call MD for:  temperature >100.4   Complete by: As directed    Diet - low sodium heart healthy   Complete by: As directed    Discharge instructions   Complete by: As directed    Please be sure to follow-up with the neurosurgeon as instructed.  Hold your blood thinners for now in case you need to undergo procedures for the brain lesion.  You were cared for by a hospitalist during your hospital stay. If you have any questions about your discharge medications or the care you received while you were in the hospital after you are discharged,  you can call the unit and asked to speak with the hospitalist on call if the hospitalist that took care of you is not available. Once you are discharged, your primary care physician will handle any further medical issues. Please note that NO REFILLS for any discharge medications will be authorized once you are discharged, as it is imperative that you return to your primary care physician (or establish a relationship with a primary care physician if you do not have one) for your aftercare needs so that they can reassess your need for medications and monitor your lab values. If you do not have a primary care physician, you can call [phone] for a physician referral.   Increase activity slowly   Complete by: As directed          Allergies as of 01/30/2022   No Known Allergies      Medication List     STOP taking these medications    aspirin EC 81 MG tablet   Rivaroxaban 15 MG Tabs tablet Commonly known as: XARELTO       TAKE these medications    Allegra Hives 24HR 180 MG tablet Generic drug: fexofenadine Take 180 mg by mouth daily.   atorvastatin 40 MG tablet Commonly known as: LIPITOR Take 40 mg by mouth daily.   benazepril 40 MG tablet Commonly known as: LOTENSIN Take 20 mg by mouth daily.   carvedilol 12.5 MG tablet Commonly known as: COREG Take 12.5 mg by mouth 2 (two) times daily with a meal.   Cinnamon 500 MG capsule Take 500 mg by mouth in the morning and at bedtime.   dexamethasone 2 MG tablet Commonly known as: DECADRON Take 1 tablet (2 mg total) by mouth 3 (three) times daily.   dilTIAZem CD 180 MG 24 hr capsule Generic drug: diltiazem Take 180 mg by mouth 2 (two) times daily.   escitalopram 10 MG tablet Commonly known as: LEXAPRO Take 10 mg by mouth daily.   fenofibrate 145 MG tablet Commonly known as: TRICOR Take 145 mg by mouth daily.   ferrous sulfate 325 (65 FE) MG tablet Take 325 mg by mouth 2 (two) times daily with a meal.   Fish Oil  1200 MG Caps Take 1,200 mg by mouth in the morning and at bedtime.   furosemide 20 MG tablet Commonly known as: LASIX Take 20 mg by mouth daily as needed for edema.   glipiZIDE 10 MG tablet Commonly known as: GLUCOTROL Take 10 mg by mouth daily before breakfast.   ketoconazole 2 % cream Commonly known as: NIZORAL Apply 1 Application topically daily. What changed:  when to take this reasons to take this   levothyroxine 150 MCG tablet Commonly known as: SYNTHROID Take 150 mcg by mouth daily before breakfast.   meclizine 25 MG tablet Commonly known as: ANTIVERT Take 1 tablet (25 mg total) by mouth 3 (three) times daily as  needed for dizziness (vertigo).   metFORMIN 1000 MG tablet Commonly known as: GLUCOPHAGE Take 1,000 mg by mouth 2 (two) times daily with a meal.   pantoprazole 40 MG tablet Commonly known as: PROTONIX Take 40 mg by mouth 2 (two) times daily.   pioglitazone 15 MG tablet Commonly known as: ACTOS Take 15 mg by mouth daily.   tamsulosin 0.4 MG Caps capsule Commonly known as: FLOMAX Take 0.4 mg by mouth daily.   triamcinolone cream 0.1 % Commonly known as: KENALOG Apply 1 application  topically daily as needed (for itching).          Follow-up Information     Dawley, Troy C, DO Follow up in 1 week(s).   Contact information: [address] [place] [address] [place] [address] [place], [place] Follow up.   Why: for vestibular PT, they will call you to start services 12/27. Contact information: [place] [address] [phone]                 TOTAL DISCHARGE TIME: 35 minutes  [facility] Pager on [email or web address]  01/31/2022, 12:05 PM

## 2022-01-30 NOTE — TOC Initial Note (Addendum)
Transition of Care [facility]) - Initial/Assessment Note    Patient Details  Name: Patrick Rocha MRN: [id number] Date of Birth: 02/01/1946  [facility]) CM/SW Contact:    Carles Collet, RN Phone Number: 01/30/2022, 8:47 AM  Clinical Narrative:                  Damaris Schooner w patient and reviewed needs for DC. Patient would like RW and HH PT. [employer] to deliver RW to the room today. [facility] accepted for [facility] vestibular PT. Start of care will be 12/27, MD notified.   Expected Discharge Plan: [place] Barriers to Discharge: Continued Medical Work up   Patient Goals and CMS Choice Patient states their goals for this hospitalization and ongoing recovery are:: to return home CMS Medicare.gov Compare Post Acute Care list provided to:: Patient Choice offered to / list presented to : Patient      Expected Discharge Plan and Services     Post Acute Care Choice: Durable Medical Equipment, Home Health                   DME Arranged: [place] rolling DME Agency: [facility] [employer] Date DME Agency Contacted: 01/30/22 Time DME Agency Contacted: [phone] Representative spoke with at DME Agency: [facility] HH Arranged: PT          Prior Living Arrangements/Services                       Activities of Daily Living Home Assistive Devices/Equipment: CPAP ADL Screening (condition at time of admission) Patient's cognitive ability adequate to safely complete daily activities?: Yes Is the patient deaf or have difficulty hearing?: Yes Does the patient have difficulty seeing, even when wearing glasses/contacts?: No Does the patient have difficulty concentrating, remembering, or making decisions?: Yes Patient able to express need for assistance with ADLs?: No Does the patient have difficulty dressing or bathing?: No Independently performs ADLs?: Yes (appropriate for developmental age) Does the patient have difficulty walking or climbing stairs?:  Yes Weakness of Legs: Both Weakness of Arms/Hands: Both  Permission Sought/Granted                  Emotional Assessment              Admission diagnosis:  Brain mass [G93.89] Uncontrolled hypertension [I10] Patient Active Problem List   Diagnosis Date Noted   Brain mass 01/29/2022   Morbid (severe) obesity due to excess calories ([place]) 01/29/2022   Hypertensive emergency 01/29/2022   Vertigo 01/29/2022   Stage 3b chronic kidney disease (CKD) ([place]) - baseline SCr 1.7-1.9 01/29/2022   COPD (chronic obstructive pulmonary disease) ([place]) 01/16/2018   Left renal mass 03/18/2016   OSA on CPAP 12/10/2015   Benign prostatic hyperplasia 10/20/2010   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   Controlled type 2 diabetes mellitus with stage 3 chronic kidney disease, without long-term current use of insulin ([place]) 07/20/2010   Hypertension 07/20/2010   Persistent atrial fibrillation ([place]) 07/20/2010   Esophageal reflux 12/06/2006   PCP:  Elliot Dally, MD Pharmacy:   [facility], [place] [address] Phone: [phone] Fax: [place] [phone] [place], [place] - [address] AT [facility] [address] [place] [address] Phone: [phone] Fax: [place], [place] [phone] [address] [place] [address] Phone: [phone] Fax: [facility] [address], [place] [address] Phone: [phone] Fax: [phone]     Social Determinants of Health (SDOH) Social History: SDOH Screenings   Food Insecurity: No Food Insecurity (01/29/2022)  Housing: Low Risk  (01/29/2022)  Transportation Needs: No Transportation Needs (01/29/2022)  Utilities: Not At Risk (01/29/2022)  Tobacco Use: Medium Risk  (01/29/2022)   SDOH Interventions:     Readmission Risk Interventions     No data to display

## 2022-01-30 NOTE — Care Management Obs Status (Signed)
[place] NOTIFICATION   Patient Details  Name: Patrick Rocha MRN: [id number] Date of Birth: 04-10-1945   Medicare Observation Status Notification Given:  Yes    Carles Collet, RN 01/30/2022, 8:53 AM

## 2022-01-30 NOTE — Progress Notes (Signed)
Subjective: Patient reports no headaches, seizures.  No vertigo episodes  Objective: Vital signs in last 24 hours: Temp:  [97.7 F (36.5 C)-98.6 F (37 C)] 97.7 F (36.5 C) (12/24 0826) Pulse Rate:  [82-96] 85 (12/24 0826) Resp:  [16-20] 18 (12/24 0826) BP: (149-182)/(61-94) 149/61 (12/24 0826) SpO2:  [94 %-99 %] 98 % (12/24 0826) Weight:  [124.6 kg] 124.6 kg (12/23 1926)  Intake/Output from previous day: 12/23 0701 - 12/24 0700 In: 480 [P.O.:480] Out: 600 [Urine:600] Intake/Output this shift: No intake/output data recorded.  NAD A+Ox3 No pronator drift  Lab Results: Recent Labs    01/28/22 2117 01/28/22 2145  WBC  --  4.9  HGB 12.6* 12.6*  HCT 37.0* 37.8*  PLT  --  175   BMET Recent Labs    01/28/22 2117 01/28/22 2145  NA 140 139  K 5.3* 5.0  CL 105 105  CO2  --  24  GLUCOSE 168* 170*  BUN 34* 24*  CREATININE 1.90* 1.92*  CALCIUM  --  8.9    Studies/Results: MR BRAIN W WO CONTRAST  Result Date: 01/29/2022 CLINICAL DATA:  Brain mass. EXAM: MRI HEAD WITHOUT AND WITH CONTRAST TECHNIQUE: Multiplanar, multiecho pulse sequences of the brain and surrounding structures were obtained without and with intravenous contrast. CONTRAST:  15m GADAVIST GADOBUTROL 1 MMOL/ML IV SOLN COMPARISON:  Noncontrast CT head and brain MRI 1 day prior. FINDINGS: Brain: As before, there is infiltrative masslike T2/FLAIR signal abnormality in the anteroinferior right frontal lobe with involvement of the orbital gyrus, gyrus rectus, and insula. There is an area of apparent cystic change within the signal abnormality measuring 2.4 cm x 1.5 cm (8-15). There is no diffusion restriction or abnormal enhancement. There is no intralesional hemorrhage. There is mild regional mass effect with trace midline shift, unchanged. Parenchymal volume is normal for age. The ventricles are normal in size. Patchy FLAIR signal abnormality throughout the remainder of the supratentorial white matter is nonspecific  but likely reflects sequela of chronic small-vessel ischemic change. There is no abnormal enhancement. Vascular: Normal flow voids. Skull and upper cervical spine: Normal marrow signal. Sinuses/Orbits: Chronic left maxillary sinusitis is again seen. Bilateral lens implants are in place. The globes and orbits are otherwise unremarkable. Other: None. IMPRESSION: No abnormal postcontrast enhancement of the infiltrative signal abnormality in the right anteroinferior frontal lobe. 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 Electronically Signed   By: PValetta MoleM.D.   On: 01/29/2022 21:05   CT CHEST ABDOMEN PELVIS W CONTRAST  Result Date: 01/29/2022 CLINICAL DATA:  Brain metastases with unknown primary; * Tracking Code: BO * EXAM: CT CHEST, ABDOMEN, AND PELVIS WITH CONTRAST TECHNIQUE: Multidetector CT imaging of the chest, abdomen and pelvis was performed following the standard protocol during bolus administration of intravenous contrast. RADIATION DOSE REDUCTION: This exam was performed according to the departmental dose-optimization program which includes automated exposure control, adjustment of the mA and/or kV according to patient size and/or use of iterative reconstruction technique. CONTRAST:  774mOMNIPAQUE IOHEXOL 350 MG/ML SOLN COMPARISON:  CT abdomen dated September 13, 2021 FINDINGS: CT CHEST FINDINGS Cardiovascular: Normal heart size. Pericardial effusion normal caliber thoracic aorta with moderate atherosclerotic disease. Severe three-vessel coronary artery calcifications. Mediastinum/Nodes: Esophagus unremarkable. No pathologically enlarged lymph nodes seen in the chest. Lungs/Pleura: Central airways are patent. Mild paraseptal emphysema. No consolidation, pleural effusion or pneumothorax. Musculoskeletal: No aggressive appearing osseous lesions. CT ABDOMEN PELVIS FINDINGS Hepatobiliary: No focal liver abnormality is seen. No gallstones, gallbladder wall thickening, or biliary  dilatation.  Pancreas: Unremarkable. No pancreatic ductal dilatation or surrounding inflammatory changes. Spleen: Normal in size without focal abnormality. Adrenals/Urinary Tract: Bilateral adrenal glands are unremarkable. No hydronephrosis. 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 Stable interpolar lesion of the right kidney measuring approximately 1 cm on series 3, image 70. Additional bilateral low-attenuation renal lesions, largest are compatible with simple cysts, others are too small to completely characterize., no specific follow-up imaging is recommended for this lesion. Bladder is unremarkable. Stomach/Bowel: Stomach is within normal limits. No evidence of bowel wall thickening, distention, or inflammatory changes. Vascular/Lymphatic: Aortic atherosclerosis. No enlarged abdominal or pelvic lymph nodes. Reproductive: Prostate is unremarkable. Other: Small right inguinal fat containing hernia. No abdominopelvic ascites. Musculoskeletal: No aggressive appearing osseous lesions IMPRESSION: 1. No evidence of primary or metastatic disease in the chest, abdomen or pelvis. 2. 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 3. Stable small hypodense lesion of the interpolar right kidney which has not demonstrated definite postcontrast enhancement on prior exams but is incompletely evaluated given small size. 4. Severe three-vessel coronary artery calcifications. 5. Aortic Atherosclerosis (ICD10-I70.0) and Emphysema (ICD10-J43.9). Electronically Signed   By: Yetta Glassman M.D.   On: 01/29/2022 16:32   MR BRAIN WO CONTRAST  Result Date: 01/28/2022 CLINICAL DATA:  Follow-up examination for neuro deficit, stroke suspected. EXAM: MRI HEAD WITHOUT CONTRAST TECHNIQUE: Multiplanar, multiecho pulse sequences of the brain and surrounding structures were obtained without intravenous contrast. COMPARISON:  Prior CT from earlier the same day. FINDINGS:  Brain: Cerebral volume within normal limits for age. Scattered patchy T2/FLAIR hyperintensity involving the periventricular and deep white matter both cerebral hemispheres, most consistent with chronic small vessel ischemic disease, moderately advanced in nature. No evidence for acute or subacute infarct. No areas of chronic cortical infarction. No acute or chronic intracranial blood products. Abnormal infiltrative T2/FLAIR signal abnormality FLAIR signal abnormality seen involving the anterior/inferior right frontal lobe, with involvement of the gyrus rectus and right insula. Somewhat cystic bubbly appearance seen within this region on T2 weighted sequence (series 5, image 15). Finding is most concerning for an intra-axial mass, with appearance most characteristic of a primary CNS neoplasm. Exact measurements of this lesion somewhat difficult given the lack of IV contrast. Surrounding T2/FLAIR signal intensity could reflect edema and/or infiltrative tumor. Associated regional mass effect with localized 3 mm right-to-left shift. No other visible mass lesion. No hydrocephalus or extra-axial fluid collection. Basilar cisterns remain widely patent. Sellar and suprasellar region within normal limits. Pituitary gland normal. Vascular: Major intracranial vascular flow voids are maintained. Skull and upper cervical spine: Craniocervical junction normal. Bone marrow signal intensity within normal limits. No focal marrow replacing lesion. No scalp soft tissue abnormality. Sinuses/Orbits: Patient status post bilateral ocular lens replacement. Chronic left frontoethmoidal and maxillary sinusitis noted. Trace bilateral mastoid effusions, of doubtful significance. Other: None. IMPRESSION: 1. Abnormal infiltrative T2/FLAIR signal abnormality involving the anterior/inferior right frontal lobe, with involvement of the gyrus rectus and right insula. Finding is most consistent with an intra-axial mass, with appearance most  characteristic of a primary CNS neoplasm. Surrounding T2/FLAIR signal intensity could reflect edema and/or infiltrative tumor. Associated regional mass effect with localized 3 mm right-to-left shift. 2. Underlying moderately advanced chronic microvascular ischemic disease. 3. Chronic left frontoethmoidal and maxillary sinusitis. Electronically Signed   By: Jeannine Boga M.D.   On: 01/28/2022 23:03   CT Head Wo Contrast  Result Date: 01/28/2022 CLINICAL DATA:  Dizziness and  headaches, initial encounter EXAM: CT HEAD WITHOUT CONTRAST TECHNIQUE: Contiguous axial images were obtained from the base of the skull through the vertex without intravenous contrast. RADIATION DOSE REDUCTION: This exam was performed according to the departmental dose-optimization program which includes automated exposure control, adjustment of the mA and/or kV according to patient size and/or use of iterative reconstruction technique. COMPARISON:  None Available. FINDINGS: Brain: There is a geographic area of decreased attenuation along the inferior aspect of the right frontal lobe likely related to evolving infarct. No focus of hemorrhage is identified. No mass lesion is noted. Mild atrophic changes are seen. Vascular: No hyperdense vessel or unexpected calcification. Skull: Normal. Negative for fracture or focal lesion. Sinuses/Orbits: Opacification is noted in the left maxillary, ethmoid and frontal sinuses. Other: None IMPRESSION: Area of decreased attenuation in the inferior aspect of the right frontal lobe extending inferiorly towards the right temporal lobe. Given the appearance this likely represents a subacute infarct. MRI would be helpful for further evaluation. Mild atrophic changes. Electronically Signed   By: Inez Catalina M.D.   On: 01/28/2022 20:29   DG Chest 1 View  Result Date: 01/28/2022 CLINICAL DATA:  Dizziness and weakness for 1 day EXAM: CHEST  1 VIEW COMPARISON:  09/14/2018 FINDINGS: The heart size and  mediastinal contours are within normal limits. Both lungs are clear. The visualized skeletal structures are unremarkable. IMPRESSION: No active disease. Electronically Signed   By: Randa Ngo M.D.   On: 01/28/2022 19:31    Assessment/Plan: 76 year old man with incidentally found right frontal infiltrative lesion concerning for possible glioma - I had a long discussion with the patient.  Plan is for him to follow-up with Dr. Reatha Armour in 1 week for further discussion of treatment options which may include surveillance, biopsy, or surgical resection.  All questions and concerns were answered.   Vallarie Mare 01/30/2022, 11:06 AM

## 2022-01-30 NOTE — Progress Notes (Signed)
Pt cleared for d/c. All questions answered. IV removed.

## 2022-02-01 LAB — HEMOGLOBIN A1C
Hgb A1c MFr Bld: 6.3 % — ABNORMAL HIGH (ref 4.8–5.6)
Mean Plasma Glucose: 134 mg/dL

## 2022-02-04 ENCOUNTER — Other Ambulatory Visit: Payer: Self-pay | Admitting: Radiation Therapy

## 2022-02-08 ENCOUNTER — Encounter (HOSPITAL_COMMUNITY): Payer: Self-pay

## 2022-02-08 ENCOUNTER — Emergency Department (HOSPITAL_COMMUNITY)
Admission: EM | Admit: 2022-02-08 | Discharge: 2022-02-10 | Discharge status: Against Medical Advice | Payer: Medicare Other | Attending: Emergency Medicine | Admitting: Emergency Medicine

## 2022-02-08 ENCOUNTER — Other Ambulatory Visit: Payer: Self-pay

## 2022-02-08 DIAGNOSIS — I10 Essential (primary) hypertension: Secondary | ICD-10-CM | POA: Insufficient documentation

## 2022-02-08 DIAGNOSIS — R739 Hyperglycemia, unspecified: Secondary | ICD-10-CM | POA: Insufficient documentation

## 2022-02-08 DIAGNOSIS — Z5321 Procedure and treatment not carried out due to patient leaving prior to being seen by health care provider: Secondary | ICD-10-CM | POA: Diagnosis not present

## 2022-02-08 LAB — CBC WITH DIFFERENTIAL/PLATELET
Abs Immature Granulocytes: 0.24 10*3/uL — ABNORMAL HIGH (ref 0.00–0.07)
Basophils Absolute: 0 10*3/uL (ref 0.0–0.1)
Basophils Relative: 0 %
Eosinophils Absolute: 0 10*3/uL (ref 0.0–0.5)
Eosinophils Relative: 0 %
HCT: 43.1 % (ref 39.0–52.0)
Hemoglobin: 15.1 g/dL (ref 13.0–17.0)
Immature Granulocytes: 1 %
Lymphocytes Relative: 4 %
Lymphs Abs: 0.7 10*3/uL (ref 0.7–4.0)
MCH: 32.3 pg (ref 26.0–34.0)
MCHC: 35 g/dL (ref 30.0–36.0)
MCV: 92.3 fL (ref 80.0–100.0)
Monocytes Absolute: 0.8 10*3/uL (ref 0.1–1.0)
Monocytes Relative: 5 %
Neutro Abs: 15.1 10*3/uL — ABNORMAL HIGH (ref 1.7–7.7)
Neutrophils Relative %: 90 %
Platelets: 290 10*3/uL (ref 150–400)
RBC: 4.67 MIL/uL (ref 4.22–5.81)
RDW: 12.2 % (ref 11.5–15.5)
WBC: 16.8 10*3/uL — ABNORMAL HIGH (ref 4.0–10.5)
nRBC: 0 % (ref 0.0–0.2)

## 2022-02-08 LAB — CBG MONITORING, ED: Glucose-Capillary: 468 mg/dL — ABNORMAL HIGH (ref 70–99)

## 2022-02-08 NOTE — ED Triage Notes (Signed)
Pt BIB EMS for hyperglycemia and HTN. Pt recently diagnose with a lesion on his brain and has had issues controlling his blood glucose and BP. Per pt, he was told to come to ED for evaluation.   CBG 466 202/106

## 2022-02-08 NOTE — ED Provider Triage Note (Signed)
  Emergency Medicine Provider Triage Evaluation Note  MRN:  [id number]  Arrival date & time: 02/08/22    Medically screening exam initiated at 11:32 PM.   CC:   Hyperglycemia   HPI:  Patrick Rocha is a 77 y.o. year-old male presents to the ED with chief complaint of hyperglycemia.  Sent by PCP due to elevated BP and glucose.  Recent diagnosis of infiltrative glioma.  Denies headache or any pain whatsoever.  Just complains of slight dry mouth.  History provided by patient. ROS:  -As included in HPI PE:   Vitals:   02/08/22 2322  BP: (!) 151/96  Pulse: 90  Resp: 18  Temp: (!) 97.5 F (36.4 C)  SpO2: 100%    Non-toxic appearing No respiratory distress CN 3-12 grossly intact MDM:  Based on signs and symptoms, hyperglycemia is highest on my differential. I've ordered labs in triage to expedite lab/diagnostic workup.  No new symptoms or headache, don't think additional head imaging would be useful right now.  Patient was informed that the remainder of the evaluation will be completed by another provider, this initial triage assessment does not replace that evaluation, and the importance of remaining in the ED until their evaluation is complete.    Montine Circle, PA-C 02/08/22 2333

## 2022-02-09 LAB — BASIC METABOLIC PANEL
Anion gap: 11 (ref 5–15)
BUN: 80 mg/dL — ABNORMAL HIGH (ref 8–23)
CO2: 17 mmol/L — ABNORMAL LOW (ref 22–32)
Calcium: 8.6 mg/dL — ABNORMAL LOW (ref 8.9–10.3)
Chloride: 103 mmol/L (ref 98–111)
Creatinine, Ser: 2.75 mg/dL — ABNORMAL HIGH (ref 0.61–1.24)
GFR, Estimated: 23 mL/min — ABNORMAL LOW (ref >60)
Glucose, Bld: 519 mg/dL (ref 70–99)
Potassium: 4.9 mmol/L (ref 3.5–5.1)
Sodium: 131 mmol/L — ABNORMAL LOW (ref 135–145)

## 2022-02-10 NOTE — ED Notes (Signed)
No answer

## 2022-02-14 ENCOUNTER — Inpatient Hospital Stay: Payer: Medicare Other

## 2022-02-17 ENCOUNTER — Other Ambulatory Visit: Payer: Self-pay | Admitting: Neurological Surgery

## 2022-02-21 ENCOUNTER — Inpatient Hospital Stay: Payer: Medicare Other

## 2022-02-22 ENCOUNTER — Other Ambulatory Visit: Payer: Self-pay | Admitting: Neurological Surgery

## 2022-02-24 NOTE — Pre-Procedure Instructions (Signed)
Surgical Instructions    Your procedure is scheduled on Tuesday, January 23rd.  Report to [facility] Main Entrance "A" at 05:30 A.M., then check in with the Admitting office.  Call this number if you have problems the morning of surgery:  [phone]  If you have any questions prior to your surgery date call [phone]: Open Monday-Friday 8am-4pm If you experience any cold or flu symptoms such as cough, fever, chills, shortness of breath, etc. between now and your scheduled surgery, please notify us at the above number.     Remember:  Do not eat or drink after midnight the night before your surgery     Take these medicines the morning of surgery with A SIP OF WATER  atorvastatin (LIPITOR)  carvedilol (COREG)  dexamethasone (DECADRON)  escitalopram (LEXAPRO)  levothyroxine (SYNTHROID, LEVOTHROID)  pantoprazole (PROTONIX)  tamsulosin (FLOMAX)    If needed: meclizine (ANTIVERT)   As of today, STOP taking any Aspirin (unless otherwise instructed by your surgeon) Aleve, Naproxen, Ibuprofen, Motrin, Advil, Goody's, BC's, all herbal medications, fish oil, and all vitamins.  WHAT DO I DO ABOUT MY DIABETES MEDICATION?   Do not take metFORMIN (GLUCOPHAGE) or pioglitazone (ACTOS) the morning of surgery.  THE NIGHT BEFORE SURGERY, take 12.5 units (50%) of LANTUS SOLOSTAR.     OR  THE MORNING OF SURGERY, take 12.5 units (50%) of LANTUS SOLOSTAR.     HOW TO MANAGE YOUR DIABETES BEFORE AND AFTER SURGERY  Why is it important to control my blood sugar before and after surgery? Improving blood sugar levels before and after surgery helps healing and can limit problems. A way of improving blood sugar control is eating a healthy diet by:  Eating less sugar and carbohydrates  Increasing activity/exercise  Talking with your doctor about reaching your blood sugar goals High blood sugars (greater than 180 mg/dL) can raise your risk of infections and slow your recovery, so you will need  to focus on controlling your diabetes during the weeks before surgery. Make sure that the doctor who takes care of your diabetes knows about your planned surgery including the date and location.  How do I manage my blood sugar before surgery? Check your blood sugar at least 4 times a day, starting 2 days before surgery, to make sure that the level is not too high or low.  Check your blood sugar the morning of your surgery when you wake up and every 2 hours until you get to the Short Stay unit.  If your blood sugar is less than 70 mg/dL, you will need to treat for low blood sugar: Do not take insulin. Treat a low blood sugar (less than 70 mg/dL) with  cup of clear juice (cranberry or apple), 4 glucose tablets, OR glucose gel. Recheck blood sugar in 15 minutes after treatment (to make sure it is greater than 70 mg/dL). If your blood sugar is not greater than 70 mg/dL on recheck, call [phone] for further instructions. Report your blood sugar to the short stay nurse when you get to Short Stay.  If you are admitted to the hospital after surgery: Your blood sugar will be checked by the staff and you will probably be given insulin after surgery (instead of oral diabetes medicines) to make sure you have good blood sugar levels. The goal for blood sugar control after surgery is 80-180 mg/dL.                     Do NOT Smoke (Tobacco/Vaping)  for 24 hours prior to your procedure.  If you use a CPAP at night, you may bring your mask/headgear for your overnight stay.   Contacts, glasses, piercing's, hearing aid's, dentures or partials may not be worn into surgery, please bring cases for these belongings.    For patients admitted to the hospital, discharge time will be determined by your treatment team.   Patients discharged the day of surgery will not be allowed to drive home, and someone needs to stay with them for 24 hours.  SURGICAL WAITING ROOM VISITATION Patients having surgery or a  procedure may have no more than 2 support people in the waiting area - these visitors may rotate.   Children under the age of 61 must have an adult with them who is not the patient. If the patient needs to stay at the hospital during part of their recovery, the visitor guidelines for inpatient rooms apply. Pre-op nurse will coordinate an appropriate time for 1 support person to accompany patient in pre-op.  This support person may not rotate.   Please refer to the [facility] website for the visitor guidelines for Inpatients (after your surgery is over and you are in a regular room).    Special instructions:   [place]- Preparing For Surgery  Before surgery, you can play an important role. Because skin is not sterile, your skin needs to be as free of germs as possible. You can reduce the number of germs on your skin by washing with CHG (chlorahexidine gluconate) Soap before surgery.  CHG is an antiseptic cleaner which kills germs and bonds with the skin to continue killing germs even after washing.    Oral Hygiene is also important to reduce your risk of infection.  Remember - BRUSH YOUR TEETH THE MORNING OF SURGERY WITH YOUR REGULAR TOOTHPASTE  Please do not use if you have an allergy to CHG or antibacterial soaps. If your skin becomes reddened/irritated stop using the CHG.  Do not shave (including legs and underarms) for at least 48 hours prior to first CHG shower. It is OK to shave your face.  Please follow these instructions carefully.   Shower the NIGHT BEFORE SURGERY and the MORNING OF SURGERY  If you chose to wash your hair, wash your hair first as usual with your normal shampoo.  After you shampoo, rinse your hair and body thoroughly to remove the shampoo.  Use CHG Soap as you would any other liquid soap. You can apply CHG directly to the skin and wash gently with a scrungie or a clean washcloth.   Apply the CHG Soap to your body ONLY FROM THE NECK DOWN.  Do not use on open  wounds or open sores. Avoid contact with your eyes, ears, mouth and genitals (private parts). Wash Face and genitals (private parts)  with your normal soap.   Wash thoroughly, paying special attention to the area where your surgery will be performed.  Thoroughly rinse your body with warm water from the neck down.  DO NOT shower/wash with your normal soap after using and rinsing off the CHG Soap.  Pat yourself dry with a CLEAN TOWEL.  Wear CLEAN PAJAMAS to bed the night before surgery  Place CLEAN SHEETS on your bed the night before your surgery  DO NOT SLEEP WITH PETS.   Day of Surgery: Take a shower with CHG soap. Do not wear jewelry  Do not wear lotions, powders, colognes, or deodorant. Men may shave face and neck. Do not bring  valuables to the hospital. [facility] is not responsible for any belongings or valuables.  Wear Clean/Comfortable clothing the morning of surgery Remember to brush your teeth WITH YOUR REGULAR TOOTHPASTE.   Please read over the following fact sheets that you were given.    If you received a COVID test during your pre-op visit  it is requested that you wear a mask when out in public, stay away from anyone that may not be feeling well and notify your surgeon if you develop symptoms. If you have been in contact with anyone that has tested positive in the last 10 days please notify you surgeon.

## 2022-02-25 ENCOUNTER — Encounter (HOSPITAL_COMMUNITY): Payer: Self-pay | Admitting: Physician Assistant

## 2022-02-25 ENCOUNTER — Other Ambulatory Visit: Payer: Self-pay

## 2022-02-25 ENCOUNTER — Encounter (HOSPITAL_COMMUNITY): Payer: Self-pay

## 2022-02-25 ENCOUNTER — Encounter (HOSPITAL_COMMUNITY)
Admission: RE | Admit: 2022-02-25 | Discharge: 2022-02-25 | Disposition: A | Discharge status: Home / Self Care | Payer: Medicare Other | Source: Ambulatory Visit | Attending: Neurological Surgery | Admitting: Neurological Surgery

## 2022-02-25 ENCOUNTER — Encounter (HOSPITAL_COMMUNITY): Payer: Self-pay | Admitting: Vascular Surgery

## 2022-02-25 VITALS — BP 118/85 | HR 88 | Temp 97.8°F | Resp 19 | Ht 69.0 in | Wt 280.0 lb

## 2022-02-25 DIAGNOSIS — Z794 Long term (current) use of insulin: Secondary | ICD-10-CM | POA: Insufficient documentation

## 2022-02-25 DIAGNOSIS — E119 Type 2 diabetes mellitus without complications: Secondary | ICD-10-CM | POA: Insufficient documentation

## 2022-02-25 DIAGNOSIS — Z01812 Encounter for preprocedural laboratory examination: Secondary | ICD-10-CM | POA: Insufficient documentation

## 2022-02-25 DIAGNOSIS — E860 Dehydration: Secondary | ICD-10-CM | POA: Diagnosis not present

## 2022-02-25 DIAGNOSIS — Z01818 Encounter for other preprocedural examination: Secondary | ICD-10-CM

## 2022-02-25 HISTORY — DX: Depression, unspecified: F32.A

## 2022-02-25 LAB — CBC
HCT: 43 % (ref 39.0–52.0)
Hemoglobin: 14.7 g/dL (ref 13.0–17.0)
MCH: 31.8 pg (ref 26.0–34.0)
MCHC: 34.2 g/dL (ref 30.0–36.0)
MCV: 93.1 fL (ref 80.0–100.0)
Platelets: 242 10*3/uL (ref 150–400)
RBC: 4.62 MIL/uL (ref 4.22–5.81)
RDW: 12.3 % (ref 11.5–15.5)
WBC: 9.4 10*3/uL (ref 4.0–10.5)
nRBC: 0 % (ref 0.0–0.2)

## 2022-02-25 LAB — GLUCOSE, CAPILLARY: Glucose-Capillary: 215 mg/dL — ABNORMAL HIGH (ref 70–99)

## 2022-02-25 LAB — BASIC METABOLIC PANEL
Anion gap: 12 (ref 5–15)
BUN: 77 mg/dL — ABNORMAL HIGH (ref 8–23)
CO2: 16 mmol/L — ABNORMAL LOW (ref 22–32)
Calcium: 9 mg/dL (ref 8.9–10.3)
Chloride: 106 mmol/L (ref 98–111)
Creatinine, Ser: 2.71 mg/dL — ABNORMAL HIGH (ref 0.61–1.24)
GFR, Estimated: 24 mL/min — ABNORMAL LOW (ref >60)
Glucose, Bld: 312 mg/dL — ABNORMAL HIGH (ref 70–99)
Potassium: 5.5 mmol/L — ABNORMAL HIGH (ref 3.5–5.1)
Sodium: 134 mmol/L — ABNORMAL LOW (ref 135–145)

## 2022-02-25 LAB — TYPE AND SCREEN
ABO/RH(D): A POS
Antibody Screen: NEGATIVE

## 2022-02-25 NOTE — Progress Notes (Addendum)
PCP - Dr. Toney Reil Cardiologist - denies  PPM/ICD - denies   Chest x-ray - 01/28/22 EKG - 01/28/22 Stress Test - Maybe 30 years ago per pt, normal per pt ECHO - Maybe 30 years ago per pt, normal per pt Cardiac Cath - denies  Sleep Study - Maybe 10 years ago per pt, OSA+ CPAP - nightly   Fasting Blood Sugar - 200-250 Checks Blood Sugar 1-2 times a day  Last dose of GLP1 agonist-  n/a   ASA/Blood Thinner Instructions: n/a   ERAS Protcol - no, NPO   COVID TEST- n/a   Anesthesia review: yes, abnormal labs  Patient denies shortness of breath, fever, cough and chest pain at PAT appointment   All instructions explained to the patient, with a verbal understanding of the material. Patient agrees to go over the instructions while at home for a better understanding. The opportunity to ask questions was provided.

## 2022-02-27 ENCOUNTER — Inpatient Hospital Stay (HOSPITAL_COMMUNITY)
Admission: EM | Admit: 2022-02-27 | Discharge: 2022-03-04 | DRG: 640 | Disposition: A | Discharge status: Skilled Nursing Facility | Payer: Medicare Other | Attending: Internal Medicine | Admitting: Internal Medicine

## 2022-02-27 ENCOUNTER — Emergency Department (HOSPITAL_COMMUNITY): Payer: Medicare Other

## 2022-02-27 DIAGNOSIS — G939 Disorder of brain, unspecified: Secondary | ICD-10-CM | POA: Diagnosis not present

## 2022-02-27 DIAGNOSIS — G9341 Metabolic encephalopathy: Secondary | ICD-10-CM | POA: Diagnosis present

## 2022-02-27 DIAGNOSIS — E860 Dehydration: Secondary | ICD-10-CM | POA: Diagnosis present

## 2022-02-27 DIAGNOSIS — Z794 Long term (current) use of insulin: Secondary | ICD-10-CM

## 2022-02-27 DIAGNOSIS — E1122 Type 2 diabetes mellitus with diabetic chronic kidney disease: Secondary | ICD-10-CM | POA: Diagnosis present

## 2022-02-27 DIAGNOSIS — R319 Hematuria, unspecified: Secondary | ICD-10-CM | POA: Diagnosis present

## 2022-02-27 DIAGNOSIS — E861 Hypovolemia: Secondary | ICD-10-CM | POA: Diagnosis present

## 2022-02-27 DIAGNOSIS — G936 Cerebral edema: Secondary | ICD-10-CM | POA: Diagnosis present

## 2022-02-27 DIAGNOSIS — E871 Hypo-osmolality and hyponatremia: Secondary | ICD-10-CM | POA: Diagnosis present

## 2022-02-27 DIAGNOSIS — F32A Depression, unspecified: Secondary | ICD-10-CM | POA: Diagnosis present

## 2022-02-27 DIAGNOSIS — I9589 Other hypotension: Secondary | ICD-10-CM | POA: Diagnosis not present

## 2022-02-27 DIAGNOSIS — N390 Urinary tract infection, site not specified: Secondary | ICD-10-CM

## 2022-02-27 DIAGNOSIS — K219 Gastro-esophageal reflux disease without esophagitis: Secondary | ICD-10-CM | POA: Diagnosis present

## 2022-02-27 DIAGNOSIS — J449 Chronic obstructive pulmonary disease, unspecified: Secondary | ICD-10-CM | POA: Diagnosis present

## 2022-02-27 DIAGNOSIS — N1832 Chronic kidney disease, stage 3b: Secondary | ICD-10-CM | POA: Diagnosis present

## 2022-02-27 DIAGNOSIS — I959 Hypotension, unspecified: Secondary | ICD-10-CM | POA: Diagnosis present

## 2022-02-27 DIAGNOSIS — Z85528 Personal history of other malignant neoplasm of kidney: Secondary | ICD-10-CM

## 2022-02-27 DIAGNOSIS — Z79890 Hormone replacement therapy (postmenopausal): Secondary | ICD-10-CM

## 2022-02-27 DIAGNOSIS — N179 Acute kidney failure, unspecified: Secondary | ICD-10-CM | POA: Diagnosis present

## 2022-02-27 DIAGNOSIS — G4733 Obstructive sleep apnea (adult) (pediatric): Secondary | ICD-10-CM | POA: Diagnosis present

## 2022-02-27 DIAGNOSIS — E039 Hypothyroidism, unspecified: Secondary | ICD-10-CM | POA: Diagnosis present

## 2022-02-27 DIAGNOSIS — Z6841 Body Mass Index (BMI) 40.0 and over, adult: Secondary | ICD-10-CM | POA: Diagnosis not present

## 2022-02-27 DIAGNOSIS — Z1152 Encounter for screening for COVID-19: Secondary | ICD-10-CM

## 2022-02-27 DIAGNOSIS — I480 Paroxysmal atrial fibrillation: Secondary | ICD-10-CM | POA: Diagnosis present

## 2022-02-27 DIAGNOSIS — R0902 Hypoxemia: Secondary | ICD-10-CM | POA: Diagnosis present

## 2022-02-27 DIAGNOSIS — Z7901 Long term (current) use of anticoagulants: Secondary | ICD-10-CM | POA: Diagnosis not present

## 2022-02-27 DIAGNOSIS — I129 Hypertensive chronic kidney disease with stage 1 through stage 4 chronic kidney disease, or unspecified chronic kidney disease: Secondary | ICD-10-CM | POA: Diagnosis present

## 2022-02-27 DIAGNOSIS — Z79899 Other long term (current) drug therapy: Secondary | ICD-10-CM

## 2022-02-27 DIAGNOSIS — I10 Essential (primary) hypertension: Secondary | ICD-10-CM | POA: Diagnosis not present

## 2022-02-27 DIAGNOSIS — R262 Difficulty in walking, not elsewhere classified: Secondary | ICD-10-CM | POA: Diagnosis present

## 2022-02-27 DIAGNOSIS — R3589 Other polyuria: Secondary | ICD-10-CM | POA: Diagnosis present

## 2022-02-27 DIAGNOSIS — Z87440 Personal history of urinary (tract) infections: Secondary | ICD-10-CM

## 2022-02-27 DIAGNOSIS — Z7984 Long term (current) use of oral hypoglycemic drugs: Secondary | ICD-10-CM

## 2022-02-27 DIAGNOSIS — Z87891 Personal history of nicotine dependence: Secondary | ICD-10-CM

## 2022-02-27 LAB — LACTIC ACID, PLASMA: Lactic Acid, Venous: 1.8 mmol/L (ref 0.5–1.9)

## 2022-02-27 LAB — CBC WITH DIFFERENTIAL/PLATELET
Abs Immature Granulocytes: 0.21 10*3/uL — ABNORMAL HIGH (ref 0.00–0.07)
Basophils Absolute: 0 10*3/uL (ref 0.0–0.1)
Basophils Relative: 0 %
Eosinophils Absolute: 0.2 10*3/uL (ref 0.0–0.5)
Eosinophils Relative: 2 %
HCT: 40 % (ref 39.0–52.0)
Hemoglobin: 13.5 g/dL (ref 13.0–17.0)
Immature Granulocytes: 3 %
Lymphocytes Relative: 15 %
Lymphs Abs: 1.3 10*3/uL (ref 0.7–4.0)
MCH: 31.2 pg (ref 26.0–34.0)
MCHC: 33.8 g/dL (ref 30.0–36.0)
MCV: 92.4 fL (ref 80.0–100.0)
Monocytes Absolute: 0.5 10*3/uL (ref 0.1–1.0)
Monocytes Relative: 6 %
Neutro Abs: 6.2 10*3/uL (ref 1.7–7.7)
Neutrophils Relative %: 74 %
Platelets: 207 10*3/uL (ref 150–400)
RBC: 4.33 MIL/uL (ref 4.22–5.81)
RDW: 12.8 % (ref 11.5–15.5)
WBC: 8.4 10*3/uL (ref 4.0–10.5)
nRBC: 0 % (ref 0.0–0.2)

## 2022-02-27 LAB — I-STAT CHEM 8, ED
BUN: 68 mg/dL — ABNORMAL HIGH (ref 8–23)
Calcium, Ion: 1.23 mmol/L (ref 1.15–1.40)
Chloride: 107 mmol/L (ref 98–111)
Creatinine, Ser: 3.5 mg/dL — ABNORMAL HIGH (ref 0.61–1.24)
Glucose, Bld: 127 mg/dL — ABNORMAL HIGH (ref 70–99)
HCT: 39 % (ref 39.0–52.0)
Hemoglobin: 13.3 g/dL (ref 13.0–17.0)
Potassium: 4.7 mmol/L (ref 3.5–5.1)
Sodium: 134 mmol/L — ABNORMAL LOW (ref 135–145)
TCO2: 18 mmol/L — ABNORMAL LOW (ref 22–32)

## 2022-02-27 LAB — COMPREHENSIVE METABOLIC PANEL
ALT: 34 U/L (ref 0–44)
AST: 26 U/L (ref 15–41)
Albumin: 2.5 g/dL — ABNORMAL LOW (ref 3.5–5.0)
Alkaline Phosphatase: 56 U/L (ref 38–126)
Anion gap: 8 (ref 5–15)
BUN: 71 mg/dL — ABNORMAL HIGH (ref 8–23)
CO2: 18 mmol/L — ABNORMAL LOW (ref 22–32)
Calcium: 8.4 mg/dL — ABNORMAL LOW (ref 8.9–10.3)
Chloride: 106 mmol/L (ref 98–111)
Creatinine, Ser: 3.12 mg/dL — ABNORMAL HIGH (ref 0.61–1.24)
GFR, Estimated: 20 mL/min — ABNORMAL LOW (ref >60)
Glucose, Bld: 134 mg/dL — ABNORMAL HIGH (ref 70–99)
Potassium: 4.4 mmol/L (ref 3.5–5.1)
Sodium: 132 mmol/L — ABNORMAL LOW (ref 135–145)
Total Bilirubin: 1.1 mg/dL (ref 0.3–1.2)
Total Protein: 5.9 g/dL — ABNORMAL LOW (ref 6.5–8.1)

## 2022-02-27 LAB — RESP PANEL BY RT-PCR (RSV, FLU A&B, COVID)  RVPGX2
Influenza A by PCR: NEGATIVE
Influenza B by PCR: NEGATIVE
Resp Syncytial Virus by PCR: NEGATIVE
SARS Coronavirus 2 by RT PCR: NEGATIVE

## 2022-02-27 LAB — TYPE AND SCREEN
ABO/RH(D): A POS
Antibody Screen: NEGATIVE

## 2022-02-27 MED ORDER — INSULIN GLARGINE-YFGN 100 UNIT/ML ~~LOC~~ SOLN
25.0000 [IU] | Freq: Every day | SUBCUTANEOUS | Status: DC
  Administered 2022-02-28 – 2022-03-04 (×5): 25 [IU] via SUBCUTANEOUS
  Filled 2022-02-27 (×5): qty 0.25

## 2022-02-27 MED ORDER — PANTOPRAZOLE SODIUM 40 MG PO TBEC
40.0000 mg | DELAYED_RELEASE_TABLET | Freq: Two times a day (BID) | ORAL | Status: DC
  Administered 2022-02-27 – 2022-03-04 (×10): 40 mg via ORAL
  Filled 2022-02-27 (×10): qty 1

## 2022-02-27 MED ORDER — SODIUM CHLORIDE 0.9 % IV BOLUS
1000.0000 mL | Freq: Once | INTRAVENOUS | Status: AC
  Administered 2022-02-27: 1000 mL via INTRAVENOUS

## 2022-02-27 MED ORDER — ONDANSETRON HCL 4 MG PO TABS: 4.0000 mg | ORAL_TABLET | Freq: Four times a day (QID) | ORAL | Status: DC | PRN

## 2022-02-27 MED ORDER — SODIUM CHLORIDE 0.9 % IV SOLN: INTRAVENOUS | Status: AC

## 2022-02-27 MED ORDER — DEXAMETHASONE 4 MG PO TABS
2.0000 mg | ORAL_TABLET | Freq: Three times a day (TID) | ORAL | Status: DC
  Administered 2022-02-27 – 2022-03-04 (×14): 2 mg via ORAL
  Filled 2022-02-27 (×14): qty 1

## 2022-02-27 MED ORDER — ONDANSETRON HCL 4 MG/2ML IJ SOLN: 4.0000 mg | Freq: Four times a day (QID) | INTRAMUSCULAR | Status: DC | PRN

## 2022-02-27 MED ORDER — ACETAMINOPHEN 650 MG RE SUPP: 650.0000 mg | Freq: Four times a day (QID) | RECTAL | Status: DC | PRN

## 2022-02-27 MED ORDER — ACETAMINOPHEN 325 MG PO TABS: 650.0000 mg | ORAL_TABLET | Freq: Four times a day (QID) | ORAL | Status: DC | PRN

## 2022-02-27 MED ORDER — ALBUTEROL SULFATE (2.5 MG/3ML) 0.083% IN NEBU: 2.5000 mg | INHALATION_SOLUTION | RESPIRATORY_TRACT | Status: DC | PRN

## 2022-02-27 MED ORDER — LEVOTHYROXINE SODIUM 50 MCG PO TABS
150.0000 ug | ORAL_TABLET | Freq: Every day | ORAL | Status: DC
  Administered 2022-02-28 – 2022-03-04 (×5): 150 ug via ORAL
  Filled 2022-02-27 (×5): qty 1

## 2022-02-27 MED ORDER — SODIUM CHLORIDE 0.9 % IV SOLN
2.0000 g | INTRAVENOUS | Status: AC
  Administered 2022-02-27 – 2022-03-01 (×3): 2 g via INTRAVENOUS
  Filled 2022-02-27 (×3): qty 20

## 2022-02-27 MED ORDER — INSULIN ASPART 100 UNIT/ML IJ SOLN
0.0000 [IU] | Freq: Three times a day (TID) | INTRAMUSCULAR | Status: DC
  Administered 2022-02-28 (×2): 5 [IU] via SUBCUTANEOUS
  Administered 2022-02-28 – 2022-03-01 (×2): 3 [IU] via SUBCUTANEOUS
  Administered 2022-03-01: 8 [IU] via SUBCUTANEOUS
  Administered 2022-03-02 (×2): 3 [IU] via SUBCUTANEOUS
  Administered 2022-03-02: 11 [IU] via SUBCUTANEOUS
  Administered 2022-03-03: 3 [IU] via SUBCUTANEOUS
  Administered 2022-03-03: 2 [IU] via SUBCUTANEOUS
  Administered 2022-03-03 – 2022-03-04 (×2): 5 [IU] via SUBCUTANEOUS
  Administered 2022-03-04: 8 [IU] via SUBCUTANEOUS
  Filled 2022-02-27: qty 0.15

## 2022-02-27 NOTE — ED Notes (Signed)
Accompanied pt to CT

## 2022-02-27 NOTE — ED Triage Notes (Signed)
Pt BIBA from home. Pt's family was concerned for weakness x 1 week and hematuria.  PCP was concerned for UTI- given cipro.  Pt was less responsive at home today, started concerning family for dehydration. Pt was unable to walk around house.  On EMS arrival, pt was very pale. Initial BP was 91Y systolic. 89% on RA.   22 right hand. 300cc NS.   Pt in afib, hx of same.   23 capnography 12 RR 139 CBG

## 2022-02-27 NOTE — H&P (Signed)
History and Physical    Patrick Rocha ZYS:[id number] DOB: 03-Apr-1945 DOA: 02/27/2022  PCP: Elliot Dally, MD  Patient coming from: change ms /hypotension  I have personally briefly reviewed patient's old medical records in [place]  Chief Complaint: weakness /change ms , hematuria x 1 week Recent dx uti on cipro  HPI: Patrick Rocha is a 77 y.o. male with medical history significant of  morbid obesity BMI 41, history of A-fib on Xarelto, hypertension, OSA on CPAP, CKD stage IIIb baseline creatinine 1.7-1.9, type 2 diabetes, COPD, reflux , interim history of diagnosis of brain mass pending biopsy .  Patient presents with weakness/ change in ms , hematuria   progressive x 1 week in setting of recent diagnosed UTI placed on cipro by pcp. Of note in the field patient was noted by EMS to have Initial BP was 93A systolic. 89% on RA.  Patient was started on ivf, supplemental O2 and transferred to ED .Patient states he feels improved s/p treatment in ED, he currently denies any current nausea, HA, abdominal pain , dysuria but does still note feeling weak. He denies sob/chest/ or diarrhea.  ED Course:  Vitals:afeb   bp 75/55 hr 77  sat 98%  on ra  Wbc 8.4, hgb 13.5, plt 207   Na 132, K 4.4, bicarb 18cr 3.12 (3.5)  Cxr:NAD   Resp panel neg   IMPRESSION: 1. Stable vasogenic edema in the inferior right frontal lobe and right insular region with mild mass effect on the frontal horn of the right lateral ventricle. No midline shift. 2. No acute intracranial hemorrhage. 3. Left maxillary sinus disease. Correlate for acute sinusitis. CTAB MPRESSION: Trace pericardial effusion.   Small RIGHT inguinal hernia containing fat.   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   No acute intra-abdominal or intrapelvic abnormalities.   Scattered atherosclerotic calcifications including coronary arteries.  Review of Systems: As per HPI otherwise 10  point review of systems negative.   Past Medical History:  Diagnosis Date   Atrial fibrillation ([place]) 2004   pt had ablation around 2004 and says he has not went into atrial fibrillation since   Depression    Diabetes mellitus without complication ([place])    Hypertension 07/20/2010   renal ca    cryoablation left kidney 02/2016    Past Surgical History:  Procedure Laterality Date   IR GENERIC HISTORICAL  02/11/2016   IR RADIOLOGIST EVAL & MGMT 02/11/2016 Corrie Mckusick, DO [facility] INTERV RAD   IR RADIOLOGIST EVAL & MGMT  04/12/2016   IR RADIOLOGIST EVAL & MGMT  12/07/2016   IR RADIOLOGIST EVAL & MGMT  06/06/2017   IR RADIOLOGIST EVAL & MGMT  09/18/2018   IR RADIOLOGIST EVAL & MGMT  10/01/2019   IR RADIOLOGIST EVAL & MGMT  09/08/2020   radioactive iodine thyroid     pt states this was many years ago     reports that he quit smoking about 30 years ago. His smoking use included cigarettes. He has never used smokeless tobacco. He reports current alcohol use. He reports that he does not use drugs.  No Known Allergies  No family history on file.  Prior to Admission medications   Medication Sig Start Date End Date Taking? Authorizing Provider  atorvastatin (LIPITOR) 40 MG tablet Take 40 mg by mouth daily.    [provider]  carvedilol (COREG) 12.5 MG tablet Take 12.5 mg by mouth 2 (two) times daily with a meal.  [provider]  Cinnamon 500 MG capsule Take 500 mg by mouth in the morning and at bedtime.    [provider]  dexamethasone (DECADRON) 2 MG tablet Take 1 tablet (2 mg total) by mouth 3 (three) times daily. 01/30/22   Bonnielee Haff, MD  escitalopram (LEXAPRO) 10 MG tablet Take 10 mg by mouth daily.    [provider]  ferrous sulfate 325 (65 FE) MG tablet Take 325 mg by mouth 2 (two) times daily with a meal.    [provider]  furosemide (LASIX) 20 MG tablet Take 20 mg by mouth daily as needed for edema.    [provider]  ketoconazole (NIZORAL) 2 % cream Apply 1 Application topically daily. Patient taking differently: Apply 1 Application topically daily as needed for irritation. 09/02/21   Harris, Abigail, PA-C  LANTUS SOLOSTAR 100 UNIT/ML Solostar Pen Inject 25 Units into the skin daily. 02/21/22   [provider]  levothyroxine (SYNTHROID, LEVOTHROID) 150 MCG tablet Take 150 mcg by mouth daily before breakfast.    [provider]  meclizine (ANTIVERT) 25 MG tablet Take 1 tablet (25 mg total) by mouth 3 (three) times daily as needed for dizziness (vertigo). 01/30/22   Bonnielee Haff, MD  metFORMIN (GLUCOPHAGE) 1000 MG tablet Take 1,000 mg by mouth 2 (two) times daily with a meal.    [provider]  NON FORMULARY Pt uses a cpap nightly    [provider]  pantoprazole (PROTONIX) 40 MG tablet Take 40 mg by mouth 2 (two) times daily.    [provider]  pioglitazone (ACTOS) 15 MG tablet Take 15 mg by mouth daily.    [provider]  tamsulosin (FLOMAX) 0.4 MG CAPS capsule Take 0.4 mg by mouth daily.    [provider]    Physical Exam: Vitals:   02/27/22 1800 02/27/22 1815 02/27/22 1830 02/27/22 1845  BP: (!) '83/53 95/81 97/65 '$ (!) 103/55  Pulse: 89 90 88 85  Resp: '12 15 15 13  '$ Temp:      TempSrc:      SpO2: 96% 100% 99% 100%  Weight:      Height:        Constitutional: NAD, calm, comfortable Vitals:   02/27/22 1800 02/27/22 1815 02/27/22 1830 02/27/22 1845  BP: (!) '83/53 95/81 97/65 '$ (!) 103/55  Pulse: 89 90 88 85  Resp: '12 15 15 13  '$ Temp:      TempSrc:      SpO2: 96% 100% 99% 100%  Weight:      Height:       Eyes: PERRL, lids and conjunctivae normal ENMT: Mucous membranes are moist. Posterior pharynx clear of any exudate or lesions.Normal dentition.  Neck: normal, supple, no masses, no thyromegaly Respiratory: clear to auscultation bilaterally, no wheezing, no crackles. Normal respiratory effort. No accessory muscle use.   Cardiovascular: Regular rate and rhythm, no murmurs / rubs / gallops. No extremity edema. 2+ pedal pulses. No carotid bruits.  Abdomen: no tenderness, no masses palpated. No hepatosplenomegaly. Bowel sounds positive.  Musculoskeletal: no clubbing / cyanosis. No joint deformity upper and lower extremities. Good ROM, no contractures. Normal muscle tone.  Skin: no rashes, lesions, ulcers. No induration Neurologic: CN 2-12 grossly intact. Sensation intact, DTR normal. Strength 5/5 in all 4.  Psychiatric: Normal judgment and insight. Alert and oriented x 3. Normal mood.    Labs on Admission: I have personally reviewed following labs and imaging studies  CBC: Recent Labs  Lab 02/25/22 0900 02/27/22 1706 02/27/22 1712  WBC 9.4 8.4  --   NEUTROABS  --  6.2  --   HGB 14.7 13.5 13.3  HCT 43.0 40.0 39.0  MCV 93.1 92.4  --   PLT 242 207  --    Basic Metabolic Panel: Recent Labs  Lab 02/25/22 0900 02/27/22 1706 02/27/22 1712  NA 134* 132* 134*  K 5.5* 4.4 4.7  CL 106 106 107  CO2 16* 18*  --   GLUCOSE 312* 134* 127*  BUN 77* 71* 68*  CREATININE 2.71* 3.12* 3.50*  CALCIUM 9.0 8.4*  --    GFR: Estimated Creatinine Clearance: 23.8 mL/min (A) (by C-G formula based on SCr of 3.5 mg/dL (H)). Liver Function Tests: Recent Labs  Lab 02/27/22 1706  AST 26  ALT 34  ALKPHOS 56  BILITOT 1.1  PROT 5.9*  ALBUMIN 2.5*   No results for input(s): "LIPASE", "AMYLASE" in the last 168 hours. No results for input(s): "AMMONIA" in the last 168 hours. Coagulation Profile: No results for input(s): "INR", "PROTIME" in the last 168 hours. Cardiac Enzymes: No results for input(s): "CKTOTAL", "CKMB", "CKMBINDEX", "TROPONINI" in the last 168 hours. BNP (last 3 results) No results for input(s): "PROBNP" in the last 8760 hours. HbA1C: No results for input(s): "HGBA1C" in the last 72 hours. CBG: Recent Labs  Lab 02/25/22 0752  GLUCAP 215*   Lipid Profile: No results for input(s): "CHOL",  "HDL", "LDLCALC", "TRIG", "CHOLHDL", "LDLDIRECT" in the last 72 hours. Thyroid Function Tests: No results for input(s): "TSH", "T4TOTAL", "FREET4", "T3FREE", "THYROIDAB" in the last 72 hours. Anemia Panel: No results for input(s): "VITAMINB12", "FOLATE", "FERRITIN", "TIBC", "IRON", "RETICCTPCT" in the last 72 hours. Urine analysis:    Component Value Date/Time   COLORURINE YELLOW 01/28/2022 2042   APPEARANCEUR CLEAR 01/28/2022 2042   LABSPEC 1.015 01/28/2022 2042   PHURINE 5.0 01/28/2022 2042   GLUCOSEU 50 (A) 01/28/2022 2042   HGBUR SMALL (A) 01/28/2022 2042   BILIRUBINUR NEGATIVE 01/28/2022 2042   KETONESUR NEGATIVE 01/28/2022 2042   PROTEINUR 100 (A) 01/28/2022 2042   NITRITE NEGATIVE 01/28/2022 2042   LEUKOCYTESUR NEGATIVE 01/28/2022 2042    Radiological Exams on Admission: CT Renal Stone Study  Result Date: 02/27/2022 CLINICAL DATA:  Mental status change, hypotension, weakness, acute kidney injury, question renal ischemia or infarction EXAM: CT ABDOMEN AND PELVIS WITHOUT CONTRAST TECHNIQUE: Multidetector CT imaging of the abdomen and pelvis was performed following the standard protocol without IV contrast. RADIATION DOSE REDUCTION: This exam was performed according to the departmental dose-optimization program which includes automated exposure control, adjustment of the mA and/or kV according to patient size and/or use of iterative reconstruction technique. COMPARISON:  01/29/2022 FINDINGS: Lower chest: Lung bases clear.  Trace pericardial effusion. Hepatobiliary: Gallbladder and liver normal appearance Pancreas: Normal appearance Spleen: Normal appearance Adrenals/Urinary Tract: Adrenal glands normal appearance. Partially exophytic masslike area at mid LEFT kidney with central fat attenuation, by history post cryoablation of LEFT renal mass, site stable. Small simple peripelvic cyst LEFT kidney 14 mm diameter; no follow-up imaging recommended. Additional RIGHT renal cysts largest 3.5 cm  greatest size; no follow-up imaging recommended. Cortical thinning of both kidneys. No perinephric edema. No urinary tract calcification, hydronephrosis or hydroureter. Bladder decompressed. Stomach/Bowel: Short segment of normal appendix identified. Stomach and bowel loops normal appearance Vascular/Lymphatic: Atherosclerotic calcifications aorta and iliac arteries as well as coronary arteries. Aorta normal caliber. No adenopathy. Reproductive: Normal appearing prostate gland and seminal vesicles Other: Diffuse mesenteric and retroperitoneal lipomatosis.  No free air or free fluid. Small RIGHT inguinal hernia containing fat. Musculoskeletal: Osseous demineralization. IMPRESSION: Trace pericardial effusion. Small RIGHT inguinal hernia containing fat. 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 No acute intra-abdominal or intrapelvic abnormalities. Scattered atherosclerotic calcifications including coronary arteries. Aortic Atherosclerosis (ICD10-I70.0). Electronically Signed   By: Lavonia Dana M.D.   On: 02/27/2022 18:01   CT HEAD WO CONTRAST (5MM)  Result Date: 02/27/2022 CLINICAL DATA:  Mental status change EXAM: CT HEAD WITHOUT CONTRAST TECHNIQUE: Contiguous axial images were obtained from the base of the skull through the vertex without intravenous contrast. RADIATION DOSE REDUCTION: This exam was performed according to the departmental dose-optimization program which includes automated exposure control, adjustment of the mA and/or kV according to patient size and/or use of iterative reconstruction technique. COMPARISON:  CT head 01/28/2022.  MRI head 01/29/2022. FINDINGS: Brain: Again seen is vasogenic edema in the inferior right frontal lobe and right insular region similar to the prior study. There is mild mass effect on the frontal horn of the right lateral ventricle, unchanged. There is no acute intracranial hemorrhage or acute extra-axial fluid collection. There is no midline shift.  There is no hydrocephalus. No acute cortical infarcts are seen. There is stable mild periventricular white matter hypodensity, likely chronic small vessel ischemic change. Vascular: Atherosclerotic calcifications are present within the cavernous internal carotid arteries. Skull: Normal. Negative for fracture or focal lesion. Sinuses/Orbits: There is air-fluid level and mucosal thickening of the left maxillary sinus. Orbits are within normal limits. Other: None. IMPRESSION: 1. Stable vasogenic edema in the inferior right frontal lobe and right insular region with mild mass effect on the frontal horn of the right lateral ventricle. No midline shift. 2. No acute intracranial hemorrhage. 3. Left maxillary sinus disease. Correlate for acute sinusitis. Electronically Signed   By: Ronney Asters M.D.   On: 02/27/2022 17:53   DG Chest Port 1 View  Result Date: 02/27/2022 CLINICAL DATA:  Weakness EXAM: PORTABLE CHEST 1 VIEW COMPARISON:  Chest x-ray 01/28/2022 FINDINGS: The heart size and mediastinal contours are within normal limits. Both lungs are clear. The visualized skeletal structures are unremarkable. IMPRESSION: No active disease. Electronically Signed   By: Ronney Asters M.D.   On: 02/27/2022 17:30    EKG: Independently reviewed.   Assessment/Plan  Mild AKI/Dehydration  -hold nephrotoxic medications  -continue with ivfs   Uncontrolled DMII -in setting of steroid use  -resume lantus  -start on iss/fs  -titrate medications as needed   Hypotension -due to low volume related /poor intake / polyuria -responsive to ivfs now low 100's    Brain lesion with Edema -continue steroids  - has planned bx at [facility] 03/01/22   UTI -dx out patient on cipro -CTX  iv while in patient  -f/u with UA/UC    Hypertension  -hold all medications due to low bp   OSA -cpap qhs   COPD -no acute exacerbation -prn nebs   GERD -ppi     DVT prophylaxis: scd Code Status: full/ as discussed per patient wishes  in event of cardiac arrest  Family Communication: none at bedside Disposition Plan: patient  expected to be admitted less than 2 midnights  Consults called: n/a Admission status: progressive   Clance Boll MD [facility]   If 7PM-7AM, please contact night-coverage [email or web address] Password [email or web address]  02/27/2022, 7:01 PM

## 2022-02-27 NOTE — ED Provider Notes (Signed)
[facility] Provider Note   CSN: [id number] Arrival date & time: 02/27/22  1634     History  Chief Complaint  Patient presents with   Weakness    Eryc Bodey is a 77 y.o. male history of previous right frontal mass s/p resection, here presenting with weakness and hypotension.  Patient states that he was feeling weak and had has hematuria.  He was given Cipro for UTI.  He states that he has been less responsive today per family.  He is unable to walk around the house and is baseline he walks around with no trouble.  He was noted to be hypotensive with blood pressure in the 80s.  Patient denies any abdominal pain.  He admits to poor appetite.  He denies any blood in his urine for several days.  EMS gave him 300 cc of fluids.  The history is provided by the patient.       Home Medications Prior to Admission medications   Medication Sig Start Date End Date Taking? Authorizing Provider  atorvastatin (LIPITOR) 40 MG tablet Take 40 mg by mouth daily.    [provider]  carvedilol (COREG) 12.5 MG tablet Take 12.5 mg by mouth 2 (two) times daily with a meal.    [provider]  Cinnamon 500 MG capsule Take 500 mg by mouth in the morning and at bedtime.    [provider]  dexamethasone (DECADRON) 2 MG tablet Take 1 tablet (2 mg total) by mouth 3 (three) times daily. 01/30/22   Bonnielee Haff, MD  escitalopram (LEXAPRO) 10 MG tablet Take 10 mg by mouth daily.    [provider]  ferrous sulfate 325 (65 FE) MG tablet Take 325 mg by mouth 2 (two) times daily with a meal.    [provider]  furosemide (LASIX) 20 MG tablet Take 20 mg by mouth daily as needed for edema.    [provider]  ketoconazole (NIZORAL) 2 % cream Apply 1 Application topically daily. Patient taking differently: Apply 1 Application topically daily as needed for irritation. 09/02/21   Harris, Abigail, PA-C   LANTUS SOLOSTAR 100 UNIT/ML Solostar Pen Inject 25 Units into the skin daily. 02/21/22   [provider]  levothyroxine (SYNTHROID, LEVOTHROID) 150 MCG tablet Take 150 mcg by mouth daily before breakfast.    [provider]  meclizine (ANTIVERT) 25 MG tablet Take 1 tablet (25 mg total) by mouth 3 (three) times daily as needed for dizziness (vertigo). 01/30/22   Bonnielee Haff, MD  metFORMIN (GLUCOPHAGE) 1000 MG tablet Take 1,000 mg by mouth 2 (two) times daily with a meal.    [provider]  NON FORMULARY Pt uses a cpap nightly    [provider]  pantoprazole (PROTONIX) 40 MG tablet Take 40 mg by mouth 2 (two) times daily.    [provider]  pioglitazone (ACTOS) 15 MG tablet Take 15 mg by mouth daily.    [provider]  tamsulosin (FLOMAX) 0.4 MG CAPS capsule Take 0.4 mg by mouth daily.    [provider]      Allergies    Patient has no known allergies.    Review of Systems   Review of Systems  Neurological:  Positive for weakness.  All other systems reviewed and are negative.   Physical Exam Updated Vital Signs BP (!) 83/53   Pulse 89   Temp 97.9 F (36.6 C) (Rectal)   Resp 12  Ht '5\' 9"'$  (1.753 m)   Wt 124.7 kg   SpO2 96%   BMI 40.61 kg/m  Physical Exam Vitals and nursing note reviewed.  Constitutional:      Comments: Dehydrated and pale  HENT:     Head: Normocephalic.     Nose: Nose normal.     Mouth/Throat:     Mouth: Mucous membranes are dry.  Eyes:     Comments: Conjunctiva is pale  Cardiovascular:     Rate and Rhythm: Normal rate and regular rhythm.     Pulses: Normal pulses.     Heart sounds: Normal heart sounds.  Pulmonary:     Effort: Pulmonary effort is normal.     Breath sounds: Normal breath sounds.  Abdominal:     General: Abdomen is flat.     Palpations: Abdomen is soft.  Musculoskeletal:        General: Normal range of motion.     Cervical back: Normal range of motion and neck  supple.  Skin:    General: Skin is warm.  Neurological:     General: No focal deficit present.     Mental Status: He is oriented to person, place, and time.  Psychiatric:        Mood and Affect: Mood normal.        Behavior: Behavior normal.     ED Results / Procedures / Treatments   Labs (all labs ordered are listed, but only abnormal results are displayed) Labs Reviewed  CBC WITH DIFFERENTIAL/PLATELET - Abnormal; Notable for the following components:      Result Value   Abs Immature Granulocytes 0.21 (*)    All other components within normal limits  COMPREHENSIVE METABOLIC PANEL - Abnormal; Notable for the following components:   Sodium 132 (*)    CO2 18 (*)    Glucose, Bld 134 (*)    BUN 71 (*)    Creatinine, Ser 3.12 (*)    Calcium 8.4 (*)    Total Protein 5.9 (*)    Albumin 2.5 (*)    GFR, Estimated 20 (*)    All other components within normal limits  I-STAT CHEM 8, ED - Abnormal; Notable for the following components:   Sodium 134 (*)    BUN 68 (*)    Creatinine, Ser 3.50 (*)    Glucose, Bld 127 (*)    TCO2 18 (*)    All other components within normal limits  URINALYSIS, ROUTINE W REFLEX MICROSCOPIC  TYPE AND SCREEN    EKG None  Radiology CT Renal Stone Study  Result Date: 02/27/2022 CLINICAL DATA:  Mental status change, hypotension, weakness, acute kidney injury, question renal ischemia or infarction EXAM: CT ABDOMEN AND PELVIS WITHOUT CONTRAST TECHNIQUE: Multidetector CT imaging of the abdomen and pelvis was performed following the standard protocol without IV contrast. RADIATION DOSE REDUCTION: This exam was performed according to the departmental dose-optimization program which includes automated exposure control, adjustment of the mA and/or kV according to patient size and/or use of iterative reconstruction technique. COMPARISON:  01/29/2022 FINDINGS: Lower chest: Lung bases clear.  Trace pericardial effusion. Hepatobiliary: Gallbladder and liver normal  appearance Pancreas: Normal appearance Spleen: Normal appearance Adrenals/Urinary Tract: Adrenal glands normal appearance. Partially exophytic masslike area at mid LEFT kidney with central fat attenuation, by history post cryoablation of LEFT renal mass, site stable. Small simple peripelvic cyst LEFT kidney 14 mm diameter; no follow-up imaging recommended. Additional RIGHT renal cysts largest 3.5 cm greatest size; no follow-up imaging recommended.  Cortical thinning of both kidneys. No perinephric edema. No urinary tract calcification, hydronephrosis or hydroureter. Bladder decompressed. Stomach/Bowel: Short segment of normal appendix identified. Stomach and bowel loops normal appearance Vascular/Lymphatic: Atherosclerotic calcifications aorta and iliac arteries as well as coronary arteries. Aorta normal caliber. No adenopathy. Reproductive: Normal appearing prostate gland and seminal vesicles Other: Diffuse mesenteric and retroperitoneal lipomatosis. No free air or free fluid. Small RIGHT inguinal hernia containing fat. Musculoskeletal: Osseous demineralization. IMPRESSION: Trace pericardial effusion. Small RIGHT inguinal hernia containing fat. 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 No acute intra-abdominal or intrapelvic abnormalities. Scattered atherosclerotic calcifications including coronary arteries. Aortic Atherosclerosis (ICD10-I70.0). Electronically Signed   By: Lavonia Dana M.D.   On: 02/27/2022 18:01   CT HEAD WO CONTRAST (5MM)  Result Date: 02/27/2022 CLINICAL DATA:  Mental status change EXAM: CT HEAD WITHOUT CONTRAST TECHNIQUE: Contiguous axial images were obtained from the base of the skull through the vertex without intravenous contrast. RADIATION DOSE REDUCTION: This exam was performed according to the departmental dose-optimization program which includes automated exposure control, adjustment of the mA and/or kV according to patient size and/or use of iterative  reconstruction technique. COMPARISON:  CT head 01/28/2022.  MRI head 01/29/2022. FINDINGS: Brain: Again seen is vasogenic edema in the inferior right frontal lobe and right insular region similar to the prior study. There is mild mass effect on the frontal horn of the right lateral ventricle, unchanged. There is no acute intracranial hemorrhage or acute extra-axial fluid collection. There is no midline shift. There is no hydrocephalus. No acute cortical infarcts are seen. There is stable mild periventricular white matter hypodensity, likely chronic small vessel ischemic change. Vascular: Atherosclerotic calcifications are present within the cavernous internal carotid arteries. Skull: Normal. Negative for fracture or focal lesion. Sinuses/Orbits: There is air-fluid level and mucosal thickening of the left maxillary sinus. Orbits are within normal limits. Other: None. IMPRESSION: 1. Stable vasogenic edema in the inferior right frontal lobe and right insular region with mild mass effect on the frontal horn of the right lateral ventricle. No midline shift. 2. No acute intracranial hemorrhage. 3. Left maxillary sinus disease. Correlate for acute sinusitis. Electronically Signed   By: Ronney Asters M.D.   On: 02/27/2022 17:53   DG Chest Port 1 View  Result Date: 02/27/2022 CLINICAL DATA:  Weakness EXAM: PORTABLE CHEST 1 VIEW COMPARISON:  Chest x-ray 01/28/2022 FINDINGS: The heart size and mediastinal contours are within normal limits. Both lungs are clear. The visualized skeletal structures are unremarkable. IMPRESSION: No active disease. Electronically Signed   By: Ronney Asters M.D.   On: 02/27/2022 17:30    Procedures Procedures    Medications Ordered in ED Medications  sodium chloride 0.9 % bolus 1,000 mL (0 mLs Intravenous Stopped 02/27/22 1755)  sodium chloride 0.9 % bolus 1,000 mL (1,000 mLs Intravenous New Bag/Given (Non-Interop) 02/27/22 1759)    ED Course/ Medical Decision Making/ A&P                              Medical Decision Making Dennison Mcdaid is a 77 y.o. male here presenting with hypotension.  Patient has poor appetite for the last week.  Patient is already on Cipro for UTI.  Patient states that he had some hematuria that resolved but has poor appetite.  Patient arrived hypotensive.  I think it is likely from dehydration.  Will check CBC and CMP and will get CT abdomen pelvis.  6:13  PM I reviewed patient's labs and independently interpreted imaging studies.  Patient's baseline creatinine is 2.7.  Today is 3.1 and his BUN is 71.  CT head showed stable vasogenic edema.  CT abdomen pelvis showed no kidney stone or hydro.  Patient's blood pressure is stable around 90s to 100.  I think this is consistent with dehydration and prerenal process.  At this point, hospitalist to admit.   Problems Addressed: AKI (acute kidney injury) ([place]): acute illness or injury Hypotension due to hypovolemia: acute illness or injury  Amount and/or Complexity of Data Reviewed Labs: ordered. Decision-making details documented in ED Course. Radiology: ordered and independent interpretation performed. Decision-making details documented in ED Course.  Risk Decision regarding hospitalization.    Final Clinical Impression(s) / ED Diagnoses Final diagnoses:  None    Rx / DC Orders ED Discharge Orders     None         Drenda Freeze, MD 02/27/22 [facility] Crow

## 2022-02-27 NOTE — ED Notes (Signed)
Only able to get one blue blood culture bottle at this time

## 2022-02-28 ENCOUNTER — Inpatient Hospital Stay (HOSPITAL_COMMUNITY): Payer: Medicare Other

## 2022-02-28 DIAGNOSIS — Z794 Long term (current) use of insulin: Secondary | ICD-10-CM

## 2022-02-28 DIAGNOSIS — E860 Dehydration: Secondary | ICD-10-CM | POA: Diagnosis not present

## 2022-02-28 DIAGNOSIS — E039 Hypothyroidism, unspecified: Secondary | ICD-10-CM

## 2022-02-28 DIAGNOSIS — N390 Urinary tract infection, site not specified: Secondary | ICD-10-CM

## 2022-02-28 DIAGNOSIS — I10 Essential (primary) hypertension: Secondary | ICD-10-CM

## 2022-02-28 DIAGNOSIS — G9341 Metabolic encephalopathy: Secondary | ICD-10-CM

## 2022-02-28 DIAGNOSIS — I480 Paroxysmal atrial fibrillation: Secondary | ICD-10-CM

## 2022-02-28 DIAGNOSIS — N179 Acute kidney failure, unspecified: Secondary | ICD-10-CM

## 2022-02-28 DIAGNOSIS — E1122 Type 2 diabetes mellitus with diabetic chronic kidney disease: Secondary | ICD-10-CM | POA: Diagnosis not present

## 2022-02-28 DIAGNOSIS — G939 Disorder of brain, unspecified: Secondary | ICD-10-CM

## 2022-02-28 DIAGNOSIS — I9589 Other hypotension: Secondary | ICD-10-CM | POA: Diagnosis not present

## 2022-02-28 LAB — GLUCOSE, CAPILLARY
Glucose-Capillary: 219 mg/dL — ABNORMAL HIGH (ref 70–99)
Glucose-Capillary: 230 mg/dL — ABNORMAL HIGH (ref 70–99)

## 2022-02-28 LAB — COMPREHENSIVE METABOLIC PANEL
ALT: 28 U/L (ref 0–44)
AST: 18 U/L (ref 15–41)
Albumin: 2.2 g/dL — ABNORMAL LOW (ref 3.5–5.0)
Alkaline Phosphatase: 52 U/L (ref 38–126)
Anion gap: 8 (ref 5–15)
BUN: 56 mg/dL — ABNORMAL HIGH (ref 8–23)
CO2: 17 mmol/L — ABNORMAL LOW (ref 22–32)
Calcium: 7.3 mg/dL — ABNORMAL LOW (ref 8.9–10.3)
Chloride: 109 mmol/L (ref 98–111)
Creatinine, Ser: 2.63 mg/dL — ABNORMAL HIGH (ref 0.61–1.24)
GFR, Estimated: 25 mL/min — ABNORMAL LOW (ref >60)
Glucose, Bld: 283 mg/dL — ABNORMAL HIGH (ref 70–99)
Potassium: 5.1 mmol/L (ref 3.5–5.1)
Sodium: 134 mmol/L — ABNORMAL LOW (ref 135–145)
Total Bilirubin: 0.4 mg/dL (ref 0.3–1.2)
Total Protein: 5.2 g/dL — ABNORMAL LOW (ref 6.5–8.1)

## 2022-02-28 LAB — CBC
HCT: 34.4 % — ABNORMAL LOW (ref 39.0–52.0)
Hemoglobin: 11.5 g/dL — ABNORMAL LOW (ref 13.0–17.0)
MCH: 31.3 pg (ref 26.0–34.0)
MCHC: 33.4 g/dL (ref 30.0–36.0)
MCV: 93.5 fL (ref 80.0–100.0)
Platelets: 147 10*3/uL — ABNORMAL LOW (ref 150–400)
RBC: 3.68 MIL/uL — ABNORMAL LOW (ref 4.22–5.81)
RDW: 12.9 % (ref 11.5–15.5)
WBC: 5.9 10*3/uL (ref 4.0–10.5)
nRBC: 0 % (ref 0.0–0.2)

## 2022-02-28 LAB — PROCALCITONIN: Procalcitonin: 0.18 ng/mL

## 2022-02-28 LAB — CBG MONITORING, ED
Glucose-Capillary: 59 mg/dL — ABNORMAL LOW (ref 70–99)
Glucose-Capillary: 83 mg/dL (ref 70–99)
Glucose-Capillary: 139 mg/dL — ABNORMAL HIGH (ref 70–99)
Glucose-Capillary: 199 mg/dL — ABNORMAL HIGH (ref 70–99)
Glucose-Capillary: 254 mg/dL — ABNORMAL HIGH (ref 70–99)
Glucose-Capillary: 212 mg/dL — ABNORMAL HIGH (ref 70–99)

## 2022-02-28 LAB — URINALYSIS, ROUTINE W REFLEX MICROSCOPIC
Bilirubin Urine: NEGATIVE
Glucose, UA: 50 mg/dL — AB
Hgb urine dipstick: NEGATIVE
Ketones, ur: NEGATIVE mg/dL
Nitrite: NEGATIVE
Protein, ur: 30 mg/dL — AB
Specific Gravity, Urine: 1.018 (ref 1.005–1.030)
pH: 5 (ref 5.0–8.0)

## 2022-02-28 LAB — CREATININE, URINE, RANDOM: Creatinine, Urine: 173 mg/dL

## 2022-02-28 LAB — SODIUM, URINE, RANDOM: Sodium, Ur: 26 mmol/L

## 2022-02-28 LAB — LACTIC ACID, PLASMA: Lactic Acid, Venous: 1.3 mmol/L (ref 0.5–1.9)

## 2022-02-28 LAB — MAGNESIUM: Magnesium: 1.4 mg/dL — ABNORMAL LOW (ref 1.7–2.4)

## 2022-02-28 MED ORDER — MECLIZINE HCL 25 MG PO TABS: 25.0000 mg | ORAL_TABLET | Freq: Three times a day (TID) | ORAL | Status: DC | PRN

## 2022-02-28 MED ORDER — ORAL CARE MOUTH RINSE: 15.0000 mL | OROMUCOSAL | Status: DC | PRN

## 2022-02-28 MED ORDER — ESCITALOPRAM OXALATE 10 MG PO TABS
10.0000 mg | ORAL_TABLET | Freq: Every day | ORAL | Status: DC
  Administered 2022-02-28 – 2022-03-04 (×5): 10 mg via ORAL
  Filled 2022-02-28 (×5): qty 1

## 2022-02-28 MED ORDER — TAMSULOSIN HCL 0.4 MG PO CAPS
0.4000 mg | ORAL_CAPSULE | Freq: Every day | ORAL | Status: DC
  Administered 2022-02-28 – 2022-03-04 (×5): 0.4 mg via ORAL
  Filled 2022-02-28 (×5): qty 1

## 2022-02-28 MED ORDER — INSULIN ASPART 100 UNIT/ML IJ SOLN
3.0000 [IU] | Freq: Once | INTRAMUSCULAR | Status: AC
  Administered 2022-02-28: 3 [IU] via SUBCUTANEOUS

## 2022-02-28 MED ORDER — ATORVASTATIN CALCIUM 40 MG PO TABS
40.0000 mg | ORAL_TABLET | Freq: Every day | ORAL | Status: DC
  Administered 2022-02-28 – 2022-03-04 (×5): 40 mg via ORAL
  Filled 2022-02-28 (×5): qty 1

## 2022-02-28 MED ORDER — FERROUS SULFATE 325 (65 FE) MG PO TABS
325.0000 mg | ORAL_TABLET | Freq: Two times a day (BID) | ORAL | Status: DC
  Administered 2022-02-28 – 2022-03-04 (×8): 325 mg via ORAL
  Filled 2022-02-28 (×8): qty 1

## 2022-02-28 MED ORDER — HEPARIN SODIUM (PORCINE) 5000 UNIT/ML IJ SOLN
5000.0000 [IU] | Freq: Three times a day (TID) | INTRAMUSCULAR | Status: DC
  Administered 2022-02-28 – 2022-03-03 (×9): 5000 [IU] via SUBCUTANEOUS
  Filled 2022-02-28 (×9): qty 1

## 2022-02-28 MED ORDER — ORAL CARE MOUTH RINSE
15.0000 mL | OROMUCOSAL | Status: DC
  Administered 2022-02-28 – 2022-03-04 (×14): 15 mL via OROMUCOSAL

## 2022-02-28 MED ORDER — CARVEDILOL 12.5 MG PO TABS
12.5000 mg | ORAL_TABLET | Freq: Two times a day (BID) | ORAL | Status: DC
  Administered 2022-02-28 – 2022-03-04 (×8): 12.5 mg via ORAL
  Filled 2022-02-28 (×8): qty 1

## 2022-02-28 NOTE — ED Notes (Signed)
Attempt x 2 to obtain ordered blood work without success.  MD notifieid.  Will request IV team to reassess for IV and blood draw.  Daughter in law is at the bedside and wants to ensure that patient will have rehab once out of the hospital due to the weakness he is having at home.  He has had several falls and the family has noted he is very weak.

## 2022-02-28 NOTE — ED Notes (Signed)
Patient is alert and oriented.  Skin is warm and dry.  Pale in color.  Cap refill is 2 seconds.  Patient denies any complaints.  Denies any pain.  He remains on cardiac monitoring.  Patient is aware that we need to try to collect his labs.  He is in agreement.  No edema noted.  Patient lungs are clear on assessment.

## 2022-02-28 NOTE — ED Notes (Signed)
Patient is alert and oriented.  Denies any pain at this time.

## 2022-02-28 NOTE — ED Notes (Signed)
IV started by IV team, unable to draw labs. MD notified.

## 2022-02-28 NOTE — ED Notes (Signed)
Pt has not urinated, bladder scan done, 97m in bladder, MD notified.

## 2022-02-28 NOTE — Progress Notes (Signed)
PROGRESS NOTE    Patrick Rocha  YTK:[id number] DOB: 04-15-1945 DOA: 02/27/2022 PCP: Elliot Dally, MD    Chief Complaint  Patient presents with   Weakness    Brief Narrative:  Patient 77 year old gentleman history of morbid obesity BMI 41, history of A-fib on Xarelto currently on hold in anticipation of brain biopsy, hypertension, OSA on CPAP, CKD stage IIIb baseline creatinine 1.7-1.9, type 2 diabetes, COPD, GERD presented to the ED with weakness, mental status changes, hematuria, which has been progressive x 1 week in the setting of recently diagnosed UTI placed on ciprofloxacin.  Patient seen in the ED noted to be hypotensive with systolics in the 55D that responded to IV fluids, sats of 89%.  Patient noted to be in acute renal failure, history of uncontrolled diabetes in the setting of steroid use, patient placed empirically on IV Rocephin pending urine cultures.   Assessment & Plan:   Principal Problem:   Hypotension Active Problems:   Dehydration  #1 acute renal failure -Likely secondary to prerenal azotemia as patient noted to be hypotensive on presentation to the ED with systolic blood pressures in the 60s, creatinine on admission was 3.12 with a baseline creatinine of approximately 1.7-1.9. -Urinalysis done this morning nitrite negative, small leukocytes, 30 protein, 50 glucose, rare bacteria, 0-5 WBCs. -Urine sodium noted this morning at 26, urine creatinine of 173. -Labs pending. -Placed on IV fluids normal saline 125 cc an hour.  Monitor urine output. -Strict I's and O's, daily weights.  2.  Dehydration IV fluids.  3.??  Recent UTI -Urine cultures pending. -IV Rocephin.  4.  Acute metabolic encephalopathy -Patient presented with confusion, generalized weakness, systolic blood pressures in the 60s, recently diagnosed with UTI placed on ciprofloxacin. -Confusion likely secondary to dehydration/hypovolemia in the setting of recent ciprofloxacin. -Chest  x-ray negative for any acute infiltrate.  Patient afebrile. -Urine cultures pending. -Ciprofloxacin discontinued as COVID likely etiology. -Improving clinically. -Continue empiric IV Rocephin.  5.  Hypotension -Likely secondary to hypovolemic hypotension. -Urine cultures pending. -Hypotension responded to IV fluids. -Continue IV fluids, empiric IV Rocephin pending urine cultures.  6.  Well-controlled diabetes mellitus type 2 -Hemoglobin A1c 6.3 (01/29/2022) -Patient noted elevated CBGs in the setting of ongoing steroid use. -Oral hypoglycemic agents. -Continue Semglee 25 units daily, SSI.  7.  Hypothyroidism -Continue home dose Synthroid.  8.  GERD -PPI.  9.  History of atrial fibrillation -Status post ablation 2004. -Resume home regimen Coreg for rate control. -Anticoagulation on hold while awaiting brain biopsy which was to be done 03/01/2022. -Continue to hold anticoagulation.  10.  Hypertension -Continue to hold antihypertensive medications patient presented with hypotension.  11.  OSA -CPAP nightly.  12.  COPD -Stable. -Nebs as needed.  13.  Brain lesion with edema -Patient noted to have planned biopsy): 03/01/2022. -Continue Decadron/steroids. -Informed neurosurgery, Dr. Reatha Armour of patient's admission who recommended plan biopsy will likely need to be rescheduled.     DVT prophylaxis: Heparin Code Status: Full Family Communication: Updated patient.  No family at bedside. Disposition: Likely home when clinically improved.  Status is: Inpatient Remains inpatient appropriate because: Severity of illness   Consultants:  None  Procedures: CT renal stone protocol 02/27/2022 CT head 02/27/2022 Chest x-ray 02/27/2022   Antimicrobials:  IV Rocephin 02/27/2022>>>>>>   Subjective: Laying on gurney in the ED.  Denies any chest pain.  No shortness of breath.  No abdominal pain.  Alert and oriented to self place and time.  When asked what year  it is he states  1984 however noted January, knows who the last 3 presidents are.  Objective: Vitals:   02/28/22 0600 02/28/22 0630 02/28/22 0800 02/28/22 0924  BP: 129/75 139/86 134/62   Pulse:  97 94   Resp: '15 14 12   '$ Temp:    98.6 F (37 C)  TempSrc:    Oral  SpO2:  100% 94%   Weight:      Height:        Intake/Output Summary (Last 24 hours) at 02/28/2022 1051 Last data filed at 02/27/2022 1922 Gross per 24 hour  Intake 1300 ml  Output --  Net 1300 ml   Filed Weights   02/27/22 1646  Weight: 124.7 kg    Examination:  General exam: Appears calm and comfortable  Respiratory system: Clear to auscultation.  No wheezes, no crackles, no rhonchi.  Fair air movement.  Speaking in full sentences.  Cardiovascular system: Irregularly irregular.  No JVD, no murmurs rubs or gallops.  No lower extremity edema.  Gastrointestinal system: Abdomen is nondistended, soft and nontender. No organomegaly or masses felt. Normal bowel sounds heard. Central nervous system: Alert and oriented. No focal neurological deficits. Extremities: Symmetric 5 x 5 power. Skin: No rashes, lesions or ulcers Psychiatry: Judgement and insight appear normal. Mood & affect appropriate.     Data Reviewed: I have personally reviewed following labs and imaging studies  CBC: Recent Labs  Lab 02/25/22 0900 02/27/22 1706 02/27/22 1712  WBC 9.4 8.4  --   NEUTROABS  --  6.2  --   HGB 14.7 13.5 13.3  HCT 43.0 40.0 39.0  MCV 93.1 92.4  --   PLT 242 207  --     Basic Metabolic Panel: Recent Labs  Lab 02/25/22 0900 02/27/22 1706 02/27/22 1712  NA 134* 132* 134*  K 5.5* 4.4 4.7  CL 106 106 107  CO2 16* 18*  --   GLUCOSE 312* 134* 127*  BUN 77* 71* 68*  CREATININE 2.71* 3.12* 3.50*  CALCIUM 9.0 8.4*  --     GFR: Estimated Creatinine Clearance: 23.8 mL/min (A) (by C-G formula based on SCr of 3.5 mg/dL (H)).  Liver Function Tests: Recent Labs  Lab 02/27/22 1706  AST 26  ALT 34  ALKPHOS 56  BILITOT 1.1  PROT  5.9*  ALBUMIN 2.5*    CBG: Recent Labs  Lab 02/25/22 0752 02/28/22 0308 02/28/22 0403 02/28/22 0528 02/28/22 0902  GLUCAP 215* 59* 83 139* 199*     Recent Results (from the past 240 hour(s))  Resp panel by RT-PCR (RSV, Flu A&B, Covid) Anterior Nasal Swab     Status: None   Collection Time: 02/27/22  6:14 PM   Specimen: Anterior Nasal Swab  Result Value Ref Range Status   SARS Coronavirus 2 by RT PCR NEGATIVE NEGATIVE Final    Comment: (NOTE) SARS-CoV-2 target nucleic acids are NOT DETECTED.  The SARS-CoV-2 RNA is generally detectable in upper respiratory specimens during the acute phase of infection. The lowest concentration of SARS-CoV-2 viral copies this assay can detect is 138 copies/mL. A negative result does not preclude SARS-Cov-2 infection and should not be used as the sole basis for treatment or other patient management decisions. A negative result may occur with  improper specimen collection/handling, submission of specimen other than nasopharyngeal swab, presence of viral mutation(s) within the areas targeted by this assay, and inadequate number of viral copies(<138 copies/mL). A negative result must be combined with clinical observations, patient history, and  epidemiological information. The expected result is Negative.  Fact Sheet for Patients:  EntrepreneurPulse.com.au  Fact Sheet for Healthcare Providers:  IncredibleEmployment.be  This test is no t yet approved or cleared by the [place] FDA and  has been authorized for detection and/or diagnosis of SARS-CoV-2 by FDA under an Emergency Use Authorization (EUA). This EUA will remain  in effect (meaning this test can be used) for the duration of the COVID-19 declaration under Section 564(b)(1) of the Act, 21 U.S.C.section 360bbb-3(b)(1), unless the authorization is terminated  or revoked sooner.       Influenza A by PCR NEGATIVE NEGATIVE Final   Influenza B by  PCR NEGATIVE NEGATIVE Final    Comment: (NOTE) The Xpert Xpress SARS-CoV-2/FLU/RSV plus assay is intended as an aid in the diagnosis of influenza from Nasopharyngeal swab specimens and should not be used as a sole basis for treatment. Nasal washings and aspirates are unacceptable for Xpert Xpress SARS-CoV-2/FLU/RSV testing.  Fact Sheet for Patients: EntrepreneurPulse.com.au  Fact Sheet for Healthcare Providers: IncredibleEmployment.be  This test is not yet approved or cleared by the [place] FDA and has been authorized for detection and/or diagnosis of SARS-CoV-2 by FDA under an Emergency Use Authorization (EUA). This EUA will remain in effect (meaning this test can be used) for the duration of the COVID-19 declaration under Section 564(b)(1) of the Act, 21 U.S.C. section 360bbb-3(b)(1), unless the authorization is terminated or revoked.     Resp Syncytial Virus by PCR NEGATIVE NEGATIVE Final    Comment: (NOTE) Fact Sheet for Patients: EntrepreneurPulse.com.au  Fact Sheet for Healthcare Providers: IncredibleEmployment.be  This test is not yet approved or cleared by the [place] FDA and has been authorized for detection and/or diagnosis of SARS-CoV-2 by FDA under an Emergency Use Authorization (EUA). This EUA will remain in effect (meaning this test can be used) for the duration of the COVID-19 declaration under Section 564(b)(1) of the Act, 21 U.S.C. section 360bbb-3(b)(1), unless the authorization is terminated or revoked.  Performed at [facility], [place] [address] [place], [place] [address]          Radiology Studies: CT Renal Stone Study  Result Date: 02/27/2022 CLINICAL DATA:  Mental status change, hypotension, weakness, acute kidney injury, question renal ischemia or infarction EXAM: CT ABDOMEN AND PELVIS WITHOUT CONTRAST TECHNIQUE: Multidetector CT imaging of  the abdomen and pelvis was performed following the standard protocol without IV contrast. RADIATION DOSE REDUCTION: This exam was performed according to the departmental dose-optimization program which includes automated exposure control, adjustment of the mA and/or kV according to patient size and/or use of iterative reconstruction technique. COMPARISON:  01/29/2022 FINDINGS: Lower chest: Lung bases clear.  Trace pericardial effusion. Hepatobiliary: Gallbladder and liver normal appearance Pancreas: Normal appearance Spleen: Normal appearance Adrenals/Urinary Tract: Adrenal glands normal appearance. Partially exophytic masslike area at mid LEFT kidney with central fat attenuation, by history post cryoablation of LEFT renal mass, site stable. Small simple peripelvic cyst LEFT kidney 14 mm diameter; no follow-up imaging recommended. Additional RIGHT renal cysts largest 3.5 cm greatest size; no follow-up imaging recommended. Cortical thinning of both kidneys. No perinephric edema. No urinary tract calcification, hydronephrosis or hydroureter. Bladder decompressed. Stomach/Bowel: Short segment of normal appendix identified. Stomach and bowel loops normal appearance Vascular/Lymphatic: Atherosclerotic calcifications aorta and iliac arteries as well as coronary arteries. Aorta normal caliber. No adenopathy. Reproductive: Normal appearing prostate gland and seminal vesicles Other: Diffuse mesenteric and retroperitoneal lipomatosis. No free air or free fluid. Small RIGHT inguinal  hernia containing fat. Musculoskeletal: Osseous demineralization. IMPRESSION: Trace pericardial effusion. Small RIGHT inguinal hernia containing fat. 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 No acute intra-abdominal or intrapelvic abnormalities. Scattered atherosclerotic calcifications including coronary arteries. Aortic Atherosclerosis (ICD10-I70.0). Electronically Signed   By: Lavonia Dana M.D.   On: 02/27/2022 18:01   CT  HEAD WO CONTRAST (5MM)  Result Date: 02/27/2022 CLINICAL DATA:  Mental status change EXAM: CT HEAD WITHOUT CONTRAST TECHNIQUE: Contiguous axial images were obtained from the base of the skull through the vertex without intravenous contrast. RADIATION DOSE REDUCTION: This exam was performed according to the departmental dose-optimization program which includes automated exposure control, adjustment of the mA and/or kV according to patient size and/or use of iterative reconstruction technique. COMPARISON:  CT head 01/28/2022.  MRI head 01/29/2022. FINDINGS: Brain: Again seen is vasogenic edema in the inferior right frontal lobe and right insular region similar to the prior study. There is mild mass effect on the frontal horn of the right lateral ventricle, unchanged. There is no acute intracranial hemorrhage or acute extra-axial fluid collection. There is no midline shift. There is no hydrocephalus. No acute cortical infarcts are seen. There is stable mild periventricular white matter hypodensity, likely chronic small vessel ischemic change. Vascular: Atherosclerotic calcifications are present within the cavernous internal carotid arteries. Skull: Normal. Negative for fracture or focal lesion. Sinuses/Orbits: There is air-fluid level and mucosal thickening of the left maxillary sinus. Orbits are within normal limits. Other: None. IMPRESSION: 1. Stable vasogenic edema in the inferior right frontal lobe and right insular region with mild mass effect on the frontal horn of the right lateral ventricle. No midline shift. 2. No acute intracranial hemorrhage. 3. Left maxillary sinus disease. Correlate for acute sinusitis. Electronically Signed   By: Ronney Asters M.D.   On: 02/27/2022 17:53   DG Chest Port 1 View  Result Date: 02/27/2022 CLINICAL DATA:  Weakness EXAM: PORTABLE CHEST 1 VIEW COMPARISON:  Chest x-ray 01/28/2022 FINDINGS: The heart size and mediastinal contours are within normal limits. Both lungs are  clear. The visualized skeletal structures are unremarkable. IMPRESSION: No active disease. Electronically Signed   By: Ronney Asters M.D.   On: 02/27/2022 17:30        Scheduled Meds:  dexamethasone  2 mg Oral TID   insulin aspart  0-15 Units Subcutaneous TID WC   insulin glargine-yfgn  25 Units Subcutaneous Daily   levothyroxine  150 mcg Oral QAC breakfast   pantoprazole  40 mg Oral BID   Continuous Infusions:  sodium chloride 125 mL/hr at 02/28/22 0904   cefTRIAXone (ROCEPHIN)  IV Stopped (02/27/22 2342)     LOS: 1 day    Time spent: 40 minutes    Irine Seal, MD [facility]   To contact the attending provider between 7A-7P or the covering provider during after hours 7P-7A, please log into the web site [email or web address] and access using universal [place] password for that web site. If you do not have the password, please call the hospital operator.  02/28/2022, 10:51 AM

## 2022-03-01 ENCOUNTER — Inpatient Hospital Stay (HOSPITAL_COMMUNITY): Admission: RE | Admit: 2022-03-01 | Payer: Medicare Other | Source: Ambulatory Visit | Admitting: Neurological Surgery

## 2022-03-01 ENCOUNTER — Encounter (HOSPITAL_COMMUNITY): Payer: Self-pay | Admitting: Internal Medicine

## 2022-03-01 ENCOUNTER — Other Ambulatory Visit: Payer: Self-pay

## 2022-03-01 DIAGNOSIS — E1122 Type 2 diabetes mellitus with diabetic chronic kidney disease: Secondary | ICD-10-CM | POA: Diagnosis not present

## 2022-03-01 DIAGNOSIS — N179 Acute kidney failure, unspecified: Secondary | ICD-10-CM | POA: Diagnosis not present

## 2022-03-01 DIAGNOSIS — I9589 Other hypotension: Secondary | ICD-10-CM | POA: Diagnosis not present

## 2022-03-01 DIAGNOSIS — E860 Dehydration: Secondary | ICD-10-CM | POA: Diagnosis not present

## 2022-03-01 LAB — COMPREHENSIVE METABOLIC PANEL
ALT: 27 U/L (ref 0–44)
AST: 16 U/L (ref 15–41)
Albumin: 1.9 g/dL — ABNORMAL LOW (ref 3.5–5.0)
Alkaline Phosphatase: 45 U/L (ref 38–126)
Anion gap: 8 (ref 5–15)
BUN: 44 mg/dL — ABNORMAL HIGH (ref 8–23)
CO2: 18 mmol/L — ABNORMAL LOW (ref 22–32)
Calcium: 7.5 mg/dL — ABNORMAL LOW (ref 8.9–10.3)
Chloride: 111 mmol/L (ref 98–111)
Creatinine, Ser: 2.36 mg/dL — ABNORMAL HIGH (ref 0.61–1.24)
GFR, Estimated: 28 mL/min — ABNORMAL LOW (ref >60)
Glucose, Bld: 123 mg/dL — ABNORMAL HIGH (ref 70–99)
Potassium: 4.5 mmol/L (ref 3.5–5.1)
Sodium: 137 mmol/L (ref 135–145)
Total Bilirubin: 0.6 mg/dL (ref 0.3–1.2)
Total Protein: 5.2 g/dL — ABNORMAL LOW (ref 6.5–8.1)

## 2022-03-01 LAB — CBC WITH DIFFERENTIAL/PLATELET
Abs Immature Granulocytes: 0.14 10*3/uL — ABNORMAL HIGH (ref 0.00–0.07)
Basophils Absolute: 0 10*3/uL (ref 0.0–0.1)
Basophils Relative: 0 %
Eosinophils Absolute: 0 10*3/uL (ref 0.0–0.5)
Eosinophils Relative: 0 %
HCT: 34.7 % — ABNORMAL LOW (ref 39.0–52.0)
Hemoglobin: 11.7 g/dL — ABNORMAL LOW (ref 13.0–17.0)
Immature Granulocytes: 2 %
Lymphocytes Relative: 7 %
Lymphs Abs: 0.4 10*3/uL — ABNORMAL LOW (ref 0.7–4.0)
MCH: 31.1 pg (ref 26.0–34.0)
MCHC: 33.7 g/dL (ref 30.0–36.0)
MCV: 92.3 fL (ref 80.0–100.0)
Monocytes Absolute: 0.2 10*3/uL (ref 0.1–1.0)
Monocytes Relative: 3 %
Neutro Abs: 5.8 10*3/uL (ref 1.7–7.7)
Neutrophils Relative %: 88 %
Platelets: 153 10*3/uL (ref 150–400)
RBC: 3.76 MIL/uL — ABNORMAL LOW (ref 4.22–5.81)
RDW: 12.9 % (ref 11.5–15.5)
WBC: 6.6 10*3/uL (ref 4.0–10.5)
nRBC: 0 % (ref 0.0–0.2)

## 2022-03-01 LAB — MAGNESIUM: Magnesium: 1.7 mg/dL (ref 1.7–2.4)

## 2022-03-01 LAB — GLUCOSE, CAPILLARY
Glucose-Capillary: 102 mg/dL — ABNORMAL HIGH (ref 70–99)
Glucose-Capillary: 183 mg/dL — ABNORMAL HIGH (ref 70–99)
Glucose-Capillary: 251 mg/dL — ABNORMAL HIGH (ref 70–99)
Glucose-Capillary: 330 mg/dL — ABNORMAL HIGH (ref 70–99)

## 2022-03-01 LAB — URINE CULTURE: Culture: NO GROWTH

## 2022-03-01 SURGERY — FRAMELESS STEREOTACTIC BIOPSY
Anesthesia: General | Laterality: Right

## 2022-03-01 MED ORDER — INSULIN ASPART 100 UNIT/ML IJ SOLN: 0.0000 [IU] | Freq: Three times a day (TID) | INTRAMUSCULAR | Status: DC

## 2022-03-01 MED ORDER — SODIUM BICARBONATE 650 MG PO TABS
650.0000 mg | ORAL_TABLET | Freq: Two times a day (BID) | ORAL | Status: DC
  Administered 2022-03-01 – 2022-03-04 (×7): 650 mg via ORAL
  Filled 2022-03-01 (×7): qty 1

## 2022-03-01 MED ORDER — SODIUM CHLORIDE 0.9 % IV SOLN: INTRAVENOUS | Status: AC

## 2022-03-01 MED ORDER — INSULIN ASPART 100 UNIT/ML IJ SOLN
0.0000 [IU] | Freq: Every day | INTRAMUSCULAR | Status: DC
  Administered 2022-03-01: 4 [IU] via SUBCUTANEOUS
  Administered 2022-03-03: 3 [IU] via SUBCUTANEOUS

## 2022-03-01 NOTE — Evaluation (Signed)
Physical Therapy Evaluation Patient Details Name: Patrick Rocha MRN: [id number] DOB: July 15, 1945 Today's Date: 03/01/2022  History of Present Illness  Patient is a 77 year old male who presented with weakness, mental status changes, hematuria. pt has brain biopsy pending for brain lesion, PMH: hypotension, acute renal failure, and dehydration. PMH: morbid obesity, a fib,  HTN, SOA, COPD, GERD.  Clinical Impression  Pt admitted with above diagnosis.  Pt pleasant, cooperative with PT. Alert, oriented to self and place, ?some decr STM. Follows commands, converses appropriately.  Lives alone, reports falls and being weaker than his baseline recently. Pt desires SNF placement post acute. Will follow in acute setting    Pt currently with functional limitations due to the deficits listed below (see PT Problem List). Pt will benefit from skilled PT to increase their independence and safety with mobility to allow discharge to the venue listed below.          Recommendations for follow up therapy are one component of a multi-disciplinary discharge planning process, led by the attending physician.  Recommendations may be updated based on patient status, additional functional criteria and insurance authorization.  Follow Up Recommendations Skilled nursing-short term rehab (<3 hours/day) Can patient physically be transported by private vehicle: Yes    Assistance Recommended at Discharge    Patient can return home with the following  Help with stairs or ramp for entrance;Assistance with cooking/housework    Equipment Recommendations None recommended by PT  Recommendations for Other Services       Functional Status Assessment Patient has had a recent decline in their functional status and demonstrates the ability to make significant improvements in function in a reasonable and predictable amount of time.     Precautions / Restrictions Precautions Precautions: Fall Precaution Comments:  pt endorses falls, most recent just before admission      Mobility  Bed Mobility   Bed Mobility: Supine to Sit     Supine to sit: Min assist     General bed mobility comments: for transition of trunk into midline on EOB    Transfers Overall transfer level: Needs assistance Equipment used: Rolling walker (2 wheels) Transfers: Sit to/from Stand                  Ambulation/Gait Ambulation/Gait assistance: Min guard Gait Distance (Feet):  (hallway ambulation) Assistive device: Rolling walker (2 wheels) Gait Pattern/deviations: Step-through pattern, Decreased stride length, Wide base of support       General Gait Details: pt maintains bil LEs in external rotation, decr hip/knee flexion; fatigues easily but steady gait with RW support, no overt LOB  Stairs            Wheelchair Mobility    Modified Rankin (Stroke Patients Only)       Balance   Sitting-balance support: No upper extremity supported, Feet supported Sitting balance-Leahy Scale: Good       Standing balance-Leahy Scale: Fair Standing balance comment: reliant on device for amb                             Pertinent Vitals/Pain Pain Assessment Pain Assessment: No/denies pain    Home Living Family/patient expects to be discharged to:: Private residence Living Arrangements: Alone Available Help at Discharge: Family;Available PRN/intermittently Type of Home: House Home Access: Stairs to enter   [employer] of Steps: 3   Home Layout: One level Home Equipment: Other (comment) Additional Comments: unsure, pt reported  to PT he has a RW at home, reported to OT no DME    Prior Function Prior Level of Function : Independent/Modified Independent             Mobility Comments: pt reports amb with RW recently at home       Hand Dominance        Extremity/Trunk Assessment   Upper Extremity Assessment Upper Extremity Assessment: Defer to OT evaluation;Overall  [facility] for tasks assessed    Lower Extremity Assessment Lower Extremity Assessment: Overall WFL for tasks assessed    Cervical / Trunk Assessment Cervical / Trunk Assessment: Normal  Communication   Communication: No difficulties  Cognition Arousal/Alertness: Awake/alert Behavior During Therapy: WFL for tasks assessed/performed                                   General Comments: noted to have some pauses between processing cues unclear if it was hearing or cognitive. patient able to follow commands appropriately. patient did not know date or day; pt had difficulty recalling any details of pending brain bx, unsure where, when, eventually recalled the reason for havign bx        General Comments      Exercises     Assessment/Plan    PT Assessment Patient needs continued PT services  PT Problem List Decreased activity tolerance;Decreased mobility       PT Treatment Interventions DME instruction;Therapeutic exercise;Gait training;Functional mobility training;Therapeutic activities;Patient/family education    PT Goals (Current goals can be found in the Care Plan section)  Acute Rehab PT Goals Patient Stated Goal: go to rehab PT Goal Formulation: With patient Time For Goal Achievement: 03/15/22 Potential to Achieve Goals: Good    Frequency Min 2X/week     Co-evaluation               AM-PAC PT "6 Clicks" Mobility  Outcome Measure Help needed turning from your back to your side while in a flat bed without using bedrails?: A Little Help needed moving from lying on your back to sitting on the side of a flat bed without using bedrails?: A Little Help needed moving to and from a bed to a chair (including a wheelchair)?: A Little Help needed standing up from a chair using your arms (e.g., wheelchair or bedside chair)?: A Little Help needed to walk in hospital room?: A Little Help needed climbing 3-5 steps with a railing? : A Little 6 Click Score: 18     End of Session   Activity Tolerance: Patient tolerated treatment well Patient left: in bed;with call bell/phone within reach;with bed alarm set   PT Visit Diagnosis: Other abnormalities of gait and mobility (R26.89)    Time: 9323-5573 PT Time Calculation (min) (ACUTE ONLY): 21 min   Charges:   PT Evaluation $PT Eval Low Complexity: [place], PT  Acute Rehab Dept [facility]) [phone]  [facility] Weekend Pager [facility] only)  [phone]  03/01/2022  [facility] 03/01/2022, 4:36 PM

## 2022-03-01 NOTE — Evaluation (Signed)
Occupational Therapy Evaluation Patient Details Name: Patrick Rocha MRN: [id number] DOB: 02-08-1945 Today's Date: 03/01/2022   History of Present Illness Patient is a 77 year old male who presented with weakness, mental status changes, hematuria. Patient was admitted with hypotension, acute renal failure, and dehydration. PMH: morbid obesity, a fib, brian biopsy pending for brain lesion, HTN, SOA, COPD, GERD.   Clinical Impression   Patient is a 77 year old male who was admitted for above. Patient is living at home alone with PRN support from family. Patient is currently max A for LB bathing and dressing tasks, min guard for transfers and functional mobility with cues to keep RW on the ground. Patient was noted to have decreased functional activity tolerance, decreased endurance, decreased standing balance, decreased safety awareness, and decreased knowledge of AD/AE impacting participation in ADLs. Patient would continue to benefit from skilled OT services at this time while admitted and after d/c to address noted deficits in order to improve overall safety and independence in ADLs.        Recommendations for follow up therapy are one component of a multi-disciplinary discharge planning process, led by the attending physician.  Recommendations may be updated based on patient status, additional functional criteria and insurance authorization.   Follow Up Recommendations  Skilled nursing-short term rehab (<3 hours/day)     Assistance Recommended at Discharge Frequent or constant Supervision/Assistance  Patient can return home with the following A little help with walking and/or transfers;A little help with bathing/dressing/bathroom;Direct supervision/assist for financial management;Help with stairs or ramp for entrance;Assist for transportation;Direct supervision/assist for medications management;Assistance with cooking/housework    Functional Status Assessment  Patient has had a  recent decline in their functional status and demonstrates the ability to make significant improvements in function in a reasonable and predictable amount of time.  Equipment Recommendations  None recommended by OT    Recommendations for Other Services       Precautions / Restrictions Precautions Precautions: Fall Restrictions Weight Bearing Restrictions: No      Mobility Bed Mobility Overal bed mobility: Needs Assistance Bed Mobility: Supine to Sit     Supine to sit: Min assist     General bed mobility comments: for transition of trunk into midline on EOB       Balance Overall balance assessment: Mild deficits observed, not formally tested         ADL either performed or assessed with clinical judgement   ADL Overall ADL's : Needs assistance/impaired Eating/Feeding: Modified independent;Sitting Eating/Feeding Details (indicate cue type and reason): taking sips from water cup Grooming: Set up;Sitting   Upper Body Bathing: Set up;Sitting   Lower Body Bathing: Maximal assistance;Sitting/lateral leans Lower Body Bathing Details (indicate cue type and reason): unable to reach feet or bring to lap. patient did not expand upon why but reported he could not. Upper Body Dressing : Min guard;Sitting   Lower Body Dressing: Maximal assistance;Sitting/lateral leans;Sit to/from stand   Toilet Transfer: Ambulation;Rolling walker (2 wheels);Min guard Toilet Transfer Details (indicate cue type and reason): noted to pick walker up off the floor during turns with education provided to keep walker on the ground to maintain balance. Toileting- Clothing Manipulation and Hygiene: Minimal assistance;Sit to/from stand       Functional mobility during ADLs: Min guard;Rolling walker (2 wheels)       Vision Baseline Vision/History: 1 Wears glasses              Pertinent Vitals/Pain Pain Assessment Pain Assessment: No/denies pain  Hand Dominance Right   Extremity/Trunk  Assessment Upper Extremity Assessment Upper Extremity Assessment: Overall WFL for tasks assessed   Lower Extremity Assessment Lower Extremity Assessment: Defer to PT evaluation   Cervical / Trunk Assessment Cervical / Trunk Assessment: Normal   Communication Communication Communication: No difficulties   Cognition Arousal/Alertness: Awake/alert Behavior During Therapy: WFL for tasks assessed/performed Overall Cognitive Status: Within Functional Limits for tasks assessed       General Comments: noted to have some pauses between processing cues unclear if it was hearing or cognitive. patient able to follow commands appropriately. patient did not know date or day with patient looking at board in room and phone to answer question.     General Comments  noted to have cuts and scrapes on bilateral knees. patient reported falling on steps prior to admission to hospital.            [place] expects to be discharged to:: Private residence Living Arrangements: Alone Available Help at Discharge: Family;Available PRN/intermittently Type of Home: House Home Access: Stairs to enter [employer] of Steps: 3 Entrance Stairs-Rails: Left Home Layout: One level     Bathroom Shower/Tub: Chief Strategy Officer: None          Prior Functioning/Environment Prior Level of Function : Independent/Modified Independent             OT Problem List: Decreased activity tolerance;Impaired balance (sitting and/or standing);Decreased coordination;Decreased safety awareness;Decreased knowledge of use of DME or AE;Decreased knowledge of precautions      OT Treatment/Interventions: Self-care/ADL training;Energy conservation;Therapeutic exercise;DME and/or AE instruction;Therapeutic activities;Patient/family education;Balance training    OT Goals(Current goals can be found in the care plan section) Acute Rehab OT Goals Patient Stated Goal: to go  home OT Goal Formulation: With patient Time For Goal Achievement: 03/15/22 Potential to Achieve Goals: Fair  OT Frequency: Min 2X/week       AM-PAC OT "6 Clicks" Daily Activity     Outcome Measure Help from another person eating meals?: None Help from another person taking care of personal grooming?: A Little Help from another person toileting, which includes using toliet, bedpan, or urinal?: A Little Help from another person bathing (including washing, rinsing, drying)?: A Little Help from another person to put on and taking off regular upper body clothing?: A Little Help from another person to put on and taking off regular lower body clothing?: A Lot 6 Click Score: 18   End of Session Equipment Utilized During Treatment: Gait belt;Rolling walker (2 wheels) Nurse Communication: Mobility status  Activity Tolerance: Patient tolerated treatment well Patient left: in chair;with call bell/phone within reach;with chair alarm set  OT Visit Diagnosis: Unsteadiness on feet (R26.81);Other abnormalities of gait and mobility (R26.89);Muscle weakness (generalized) (M62.81)                Time: 4709-6283 OT Time Calculation (min): 17 min Charges:  OT General Charges $OT Visit: 1 Visit OT Evaluation $OT Eval Low Complexity: 1 Low  Guiselle Mian OTR/L, MS Acute Rehabilitation Department Office# [phone]   Willa Rough 03/01/2022, 1:03 PM

## 2022-03-01 NOTE — Inpatient Diabetes Management (Signed)
Inpatient Diabetes Program Recommendations  AACE/ADA: New Consensus Statement on Inpatient Glycemic Control   Target Ranges:  Prepandial:   less than 140 mg/dL      Peak postprandial:   less than 180 mg/dL (1-2 hours)      Critically ill patients:  140 - 180 mg/dL    Latest Reference Range & Units 02/28/22 05:28 02/28/22 09:02 02/28/22 11:48 02/28/22 13:10 02/28/22 15:59 02/28/22 21:12  Glucose-Capillary 70 - 99 mg/dL 139 (H) 199 (H) 254 (H) 212 (H) 219 (H) 230 (H)   Review of Glycemic Control  Diabetes history: DM2 Outpatient Diabetes medications: Lantus 25 units daily, Metformin 1000 mg BID, Actos 15 mg daily Current orders for Inpatient glycemic control: Semglee 25 units daily, Novolog 0-15 units TID with meals; Decadron 2 mg TID  Inpatient Diabetes Program Recommendations:    Insulin: If steroids are continued as ordered, please consider ordering Novolog 4 units TID with meals for meal coverage if patient eats at least 50% of meals. Also, please consider ordering Novolog 0-5 units QHS for bedtime correction.  Thanks, Barnie Alderman, RN, MSN, [place] Diabetes Coordinator Inpatient Diabetes Program [phone] (Team Pager from 8am to [place])

## 2022-03-01 NOTE — Progress Notes (Signed)
PROGRESS NOTE    Patrick Rocha  RCB:[id number] DOB: 1945/03/15 DOA: 02/27/2022 PCP: Elliot Dally, MD    Chief Complaint  Patient presents with   Weakness    Brief Narrative:  Patient 77 year old gentleman history of morbid obesity BMI 41, history of A-fib on Xarelto currently on hold in anticipation of brain biopsy, hypertension, OSA on CPAP, CKD stage IIIb baseline creatinine 1.7-1.9, type 2 diabetes, COPD, GERD presented to the ED with weakness, mental status changes, hematuria, which has been progressive x 1 week in the setting of recently diagnosed UTI placed on ciprofloxacin.  Patient seen in the ED noted to be hypotensive with systolics in the 80H that responded to IV fluids, sats of 89%.  Patient noted to be in acute renal failure, history of uncontrolled diabetes in the setting of steroid use, patient placed empirically on IV Rocephin pending urine cultures.   Assessment & Plan:   Principal Problem:   Hypotension Active Problems:   Dehydration   Acute renal failure (HCC)   Paroxysmal atrial fibrillation (HCC)   Hypothyroidism   Brain lesion   Type 2 diabetes mellitus with chronic kidney disease, with long-term current use of insulin (HCC)   Acute metabolic encephalopathy   Urinary tract infection without hematuria  #1 acute renal failure -Likely secondary to prerenal azotemia as patient noted to be hypotensive on presentation to the ED with systolic blood pressures in the 60s, creatinine on admission was 3.12 with a baseline creatinine of approximately 1.7-1.9. -Urinalysis done nitrite negative, small leukocytes, 30 protein, 50 glucose, rare bacteria, 0-5 WBCs. -Urine sodium noted this morning at 26, urine creatinine of 173. -Patient placed on IV fluids with urine output of 850 cc recorded over the past 24 hours.   -Renal function improving creatinine currently at 2.36 from 3.12 on admission.   -Decrease IV fluid rate to 100 cc an hour.   -Monitor urine  output.   -Strict I/os.  Daily weights.   2.  Dehydration Continue hydration with IV fluids.   3.??  Recent UTI -Urine cultures no growth to date.   -Continue IV Rocephin D3/3.  4.  Acute metabolic encephalopathy -Patient presented with confusion, generalized weakness, systolic blood pressures in the 60s, recently diagnosed with UTI placed on ciprofloxacin. -Confusion likely secondary to dehydration/hypovolemia in the setting of recent ciprofloxacin use. -Chest x-ray negative for any acute infiltrate.  Patient afebrile. -Urine cultures with no growth to date.  -Ciprofloxacin discontinued as confusion likely etiology of patient's acute metabolic encephalopathy.   -Clinical improvement.   -Continue empiric IV Rocephin D3/3.  5.  Hypotension -Likely secondary to hypovolemic hypotension. -Urine cultures negative.  -Hypotension responded to IV fluids. -Continue IV fluids, empiric IV Rocephin and completed 3-day course of IV Rocephin as urine cultures are negative and patient afebrile.   6.  Well-controlled diabetes mellitus type 2 -Hemoglobin A1c 6.3 (01/29/2022) -Patient noted elevated CBGs in the setting of ongoing steroid use. -Continue to hold oral hypoglycemic agents. -CBG noted at 102 this morning. -Continue Semglee 25 units daily, SSI.  7.  Hypothyroidism -Synthroid.    8.  GERD -Continue PPI.  9.  History of atrial fibrillation -Status post ablation 2004. -Continue home regimen Coreg for rate control.  -Anticoagulation on hold while awaiting brain biopsy which was to be done 03/01/2022. -Continue to hold anticoagulation pending rescheduling of brain biopsy.  10.  Hypertension -Patient had presented with hypotension, BP improving on IV fluids, continue to hold oral antihypertensive medications.    11.  OSA -CPAP nightly.  12.  COPD -Stable. -Nebs as needed.  13.  Brain lesion with edema -Patient noted to have planned biopsy scheduled for 03/01/2022 prior to  admission. -Continue Decadron/steroids. -Informed neurosurgery, Dr. Reatha Armour of patient's admission who recommended planned biopsy will likely need to be rescheduled. -CT head ordered per neurosurgery in anticipation of biopsy.     DVT prophylaxis: Heparin Code Status: Full Family Communication: Updated patient.  No family at bedside. Disposition: SNF pending PT evaluation versus home with home health.  Status is: Inpatient Remains inpatient appropriate because: Severity of illness   Consultants:  None  Procedures: CT renal stone protocol 02/27/2022 CT head 02/27/2022 Chest x-ray 02/27/2022   Antimicrobials:  IV Rocephin 02/27/2022>>>>>> 03/02/2022   Subjective: Sitting up in recliner.  No chest pain.  No shortness of breath.  No abdominal pain.  Good urine output.  Tolerating current diet.    Objective: Vitals:   02/28/22 2211 02/28/22 2344 03/01/22 0441 03/01/22 0600  BP:  134/77 125/79   Pulse: (!) 104 89 92   Resp: '18 18 18   '$ Temp:  (!) 97.5 F (36.4 C) (!) 97.5 F (36.4 C)   TempSrc:  Oral Oral   SpO2: 97% 98% 99%   Weight:    116 kg  Height:        Intake/Output Summary (Last 24 hours) at 03/01/2022 1319 Last data filed at 03/01/2022 0600 Gross per 24 hour  Intake 1385.4 ml  Output 850 ml  Net 535.4 ml    Filed Weights   02/27/22 1646 03/01/22 0600  Weight: 124.7 kg 116 kg    Examination:  General exam: NAD. Respiratory system: Lungs clear to auscultation bilaterally.  No wheezes, no crackles, no rhonchi.  Fair air movement.  Speaking in full sentences.  Cardiovascular system: Irregularly irregular.  No JVD, no murmurs rubs or gallops.  No lower extremity edema.  Gastrointestinal system: Abdomen is soft, nontender, nondistended, positive bowel sounds.  No rebound.  No guarding. Central nervous system: Alert and oriented.  Moving extremities spontaneously.  No focal neurological deficits.  Extremities: Symmetric 5 x 5 power. Skin: No rashes, lesions or  ulcers Psychiatry: Judgement and insight appear normal. Mood & affect appropriate.     Data Reviewed: I have personally reviewed following labs and imaging studies  CBC: Recent Labs  Lab 02/25/22 0900 02/27/22 1706 02/27/22 1712 02/28/22 1455 03/01/22 0621  WBC 9.4 8.4  --  5.9 6.6  NEUTROABS  --  6.2  --   --  5.8  HGB 14.7 13.5 13.3 11.5* 11.7*  HCT 43.0 40.0 39.0 34.4* 34.7*  MCV 93.1 92.4  --  93.5 92.3  PLT 242 207  --  147* 153     Basic Metabolic Panel: Recent Labs  Lab 02/25/22 0900 02/27/22 1706 02/27/22 1712 02/28/22 1455 03/01/22 0621  NA 134* 132* 134* 134* 137  K 5.5* 4.4 4.7 5.1 4.5  CL 106 106 107 109 111  CO2 16* 18*  --  17* 18*  GLUCOSE 312* 134* 127* 283* 123*  BUN 77* 71* 68* 56* 44*  CREATININE 2.71* 3.12* 3.50* 2.63* 2.36*  CALCIUM 9.0 8.4*  --  7.3* 7.5*  MG  --   --   --  1.4* 1.7     GFR: Estimated Creatinine Clearance: 34 mL/min (A) (by C-G formula based on SCr of 2.36 mg/dL (H)).  Liver Function Tests: Recent Labs  Lab 02/27/22 1706 02/28/22 1455 03/01/22 0621  AST 26 18 16  ALT 34 28 27  ALKPHOS 56 52 45  BILITOT 1.1 0.4 0.6  PROT 5.9* 5.2* 5.2*  ALBUMIN 2.5* 2.2* 1.9*     CBG: Recent Labs  Lab 02/28/22 1310 02/28/22 1559 02/28/22 2112 03/01/22 0749 03/01/22 1228  GLUCAP 212* 219* 230* 102* 183*      Recent Results (from the past 240 hour(s))  Resp panel by RT-PCR (RSV, Flu A&B, Covid) Anterior Nasal Swab     Status: None   Collection Time: 02/27/22  6:14 PM   Specimen: Anterior Nasal Swab  Result Value Ref Range Status   SARS Coronavirus 2 by RT PCR NEGATIVE NEGATIVE Final    Comment: (NOTE) SARS-CoV-2 target nucleic acids are NOT DETECTED.  The SARS-CoV-2 RNA is generally detectable in upper respiratory specimens during the acute phase of infection. The lowest concentration of SARS-CoV-2 viral copies this assay can detect is 138 copies/mL. A negative result does not preclude SARS-Cov-2 infection and  should not be used as the sole basis for treatment or other patient management decisions. A negative result may occur with  improper specimen collection/handling, submission of specimen other than nasopharyngeal swab, presence of viral mutation(s) within the areas targeted by this assay, and inadequate number of viral copies(<138 copies/mL). A negative result must be combined with clinical observations, patient history, and epidemiological information. The expected result is Negative.  Fact Sheet for Patients:  EntrepreneurPulse.com.au  Fact Sheet for Healthcare Providers:  IncredibleEmployment.be  This test is no t yet approved or cleared by the [place] FDA and  has been authorized for detection and/or diagnosis of SARS-CoV-2 by FDA under an Emergency Use Authorization (EUA). This EUA will remain  in effect (meaning this test can be used) for the duration of the COVID-19 declaration under Section 564(b)(1) of the Act, 21 U.S.C.section 360bbb-3(b)(1), unless the authorization is terminated  or revoked sooner.       Influenza A by PCR NEGATIVE NEGATIVE Final   Influenza B by PCR NEGATIVE NEGATIVE Final    Comment: (NOTE) The Xpert Xpress SARS-CoV-2/FLU/RSV plus assay is intended as an aid in the diagnosis of influenza from Nasopharyngeal swab specimens and should not be used as a sole basis for treatment. Nasal washings and aspirates are unacceptable for Xpert Xpress SARS-CoV-2/FLU/RSV testing.  Fact Sheet for Patients: EntrepreneurPulse.com.au  Fact Sheet for Healthcare Providers: IncredibleEmployment.be  This test is not yet approved or cleared by the [place] FDA and has been authorized for detection and/or diagnosis of SARS-CoV-2 by FDA under an Emergency Use Authorization (EUA). This EUA will remain in effect (meaning this test can be used) for the duration of the COVID-19 declaration  under Section 564(b)(1) of the Act, 21 U.S.C. section 360bbb-3(b)(1), unless the authorization is terminated or revoked.     Resp Syncytial Virus by PCR NEGATIVE NEGATIVE Final    Comment: (NOTE) Fact Sheet for Patients: EntrepreneurPulse.com.au  Fact Sheet for Healthcare Providers: IncredibleEmployment.be  This test is not yet approved or cleared by the [place] FDA and has been authorized for detection and/or diagnosis of SARS-CoV-2 by FDA under an Emergency Use Authorization (EUA). This EUA will remain in effect (meaning this test can be used) for the duration of the COVID-19 declaration under Section 564(b)(1) of the Act, 21 U.S.C. section 360bbb-3(b)(1), unless the authorization is terminated or revoked.  Performed at [facility], [place] [address] [place], [place] [address]   Urine Culture     Status: None   Collection Time: 02/28/22  9:29 AM  Specimen: Urine, Catheterized  Result Value Ref Range Status   Specimen Description   Final    URINE, CATHETERIZED Performed at [place] [address] [place], [place] [address]    Special Requests   Final    NONE Performed at [facility], [place] [address] [place], [place] [address]    Culture   Final    NO GROWTH Performed at [facility], [place] [address], [place], [place] [address]    Report Status 03/01/2022 FINAL  Final  Culture, blood (Routine X 2) w Reflex to ID Panel     Status: None (Preliminary result)   Collection Time: 02/28/22  4:14 PM   Specimen: BLOOD RIGHT HAND  Result Value Ref Range Status   Specimen Description   Final    BLOOD RIGHT HAND Performed at [facility], [place] [address], [place], [place] [address]    Special Requests   Final    AEROBIC BOTTLE ONLY Blood Culture results may not be optimal due to an inadequate volume of blood received in culture bottles Performed at [place] [address], [place], [place] [address]    Culture   Final    NO GROWTH < 12 HOURS Performed at [place] [address] [place], [place] [address]    Report Status PENDING  Incomplete  Culture, blood (Routine X 2) w Reflex to ID Panel     Status: None (Preliminary result)   Collection Time: 02/28/22  5:05 PM   Specimen: BLOOD RIGHT ARM  Result Value Ref Range Status   Specimen Description   Final    BLOOD RIGHT ARM Performed at [facility], [place] [address] [place],  [address]    Special Requests   Final    BOTTLES DRAWN AEROBIC ONLY Blood Culture results may not be optimal due to an inadequate volume of blood received in culture bottles Performed at [place] [address], [place],  [address]    Culture   Final    NO GROWTH < 12 HOURS Performed at [facility] [address] [place],  [address]    Report Status PENDING  Incomplete         Radiology Studies: CT DBS HEAD W/O CONTRAST (1MM)  Result Date: 02/28/2022 CLINICAL DATA:  Brain lesion.  Preop evaluation.  Weakness. EXAM: CT HEAD WITHOUT CONTRAST TECHNIQUE: Contiguous axial images were obtained from the base of the skull through the vertex without intravenous contrast. RADIATION DOSE REDUCTION: This exam was performed according to the departmental dose-optimization program which includes automated exposure control, adjustment of the mA and/or kV according to patient size and/or use of iterative reconstruction technique. COMPARISON:  Head CT 02/27/2022.  MRI brain 01/29/2022. FINDINGS: Brain: Unchanged region of predominantly subcortical hypoattenuation in the inferior right frontal lobe extending into the right insula. No acute intracranial hemorrhage. Cortical gray-white differentiation is otherwise preserved. No hydrocephalus. No extra-axial collection. Basilar cisterns are patent. Vascular: No hyperdense vessel or unexpected  calcification. Skull: Normal. 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 Mastoids are well aerated. Orbits are unremarkable. Other: None. IMPRESSION: 1. No acute intracranial hemorrhage. 2. Unchanged inferior right frontal and insular hypoattenuation, compatible with known mass. Electronically Signed   By: Emmit Alexanders M.D.   On: 02/28/2022 11:55   CT Renal Stone Study  Result Date: 02/27/2022 CLINICAL DATA:  Mental status change, hypotension, weakness, acute kidney  injury, question renal ischemia or infarction EXAM: CT ABDOMEN AND PELVIS WITHOUT CONTRAST TECHNIQUE: Multidetector CT imaging of the abdomen and pelvis was performed following the standard protocol without IV contrast. RADIATION DOSE REDUCTION: This exam was performed according to the departmental dose-optimization program which includes automated exposure control, adjustment of the mA and/or kV according to patient size and/or use of iterative reconstruction technique. COMPARISON:  01/29/2022 FINDINGS: Lower chest: Lung bases clear.  Trace pericardial effusion. Hepatobiliary: Gallbladder and liver normal appearance Pancreas: Normal appearance Spleen: Normal appearance Adrenals/Urinary Tract: Adrenal glands normal appearance. Partially exophytic masslike area at mid LEFT kidney with central fat attenuation, by history post cryoablation of LEFT renal mass, site stable. Small simple peripelvic cyst LEFT kidney 14 mm diameter; no follow-up imaging recommended. Additional RIGHT renal cysts largest 3.5 cm greatest size; no follow-up imaging recommended. Cortical thinning of both kidneys. No perinephric edema. No urinary tract calcification, hydronephrosis or hydroureter. Bladder decompressed. Stomach/Bowel: Short segment of normal appendix identified. Stomach and bowel loops normal appearance Vascular/Lymphatic: Atherosclerotic calcifications aorta and iliac arteries as well as coronary arteries. Aorta normal caliber. No  adenopathy. Reproductive: Normal appearing prostate gland and seminal vesicles Other: Diffuse mesenteric and retroperitoneal lipomatosis. No free air or free fluid. Small RIGHT inguinal hernia containing fat. Musculoskeletal: Osseous demineralization. IMPRESSION: Trace pericardial effusion. Small RIGHT inguinal hernia containing fat. 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 No acute intra-abdominal or intrapelvic abnormalities. Scattered atherosclerotic calcifications including coronary arteries. Aortic Atherosclerosis (ICD10-I70.0). Electronically Signed   By: Lavonia Dana M.D.   On: 02/27/2022 18:01   CT HEAD WO CONTRAST (5MM)  Result Date: 02/27/2022 CLINICAL DATA:  Mental status change EXAM: CT HEAD WITHOUT CONTRAST TECHNIQUE: Contiguous axial images were obtained from the base of the skull through the vertex without intravenous contrast. RADIATION DOSE REDUCTION: This exam was performed according to the departmental dose-optimization program which includes automated exposure control, adjustment of the mA and/or kV according to patient size and/or use of iterative reconstruction technique. COMPARISON:  CT head 01/28/2022.  MRI head 01/29/2022. FINDINGS: Brain: Again seen is vasogenic edema in the inferior right frontal lobe and right insular region similar to the prior study. There is mild mass effect on the frontal horn of the right lateral ventricle, unchanged. There is no acute intracranial hemorrhage or acute extra-axial fluid collection. There is no midline shift. There is no hydrocephalus. No acute cortical infarcts are seen. There is stable mild periventricular white matter hypodensity, likely chronic small vessel ischemic change. Vascular: Atherosclerotic calcifications are present within the cavernous internal carotid arteries. Skull: Normal. Negative for fracture or focal lesion. Sinuses/Orbits: There is air-fluid level and mucosal thickening of the left maxillary sinus.  Orbits are within normal limits. Other: None. IMPRESSION: 1. Stable vasogenic edema in the inferior right frontal lobe and right insular region with mild mass effect on the frontal horn of the right lateral ventricle. No midline shift. 2. No acute intracranial hemorrhage. 3. Left maxillary sinus disease. Correlate for acute sinusitis. Electronically Signed   By: Ronney Asters M.D.   On: 02/27/2022 17:53   DG Chest Port 1 View  Result Date: 02/27/2022 CLINICAL DATA:  Weakness EXAM: PORTABLE CHEST 1 VIEW COMPARISON:  Chest x-ray 01/28/2022 FINDINGS: The heart size and mediastinal contours are within normal limits. Both lungs are clear. The visualized skeletal structures are unremarkable. IMPRESSION: No active disease. Electronically Signed   By: Ronney Asters M.D.   On: 02/27/2022 17:30        Scheduled  Meds:  atorvastatin  40 mg Oral Daily   carvedilol  12.5 mg Oral BID WC   dexamethasone  2 mg Oral TID   escitalopram  10 mg Oral Daily   ferrous sulfate  325 mg Oral BID WC   heparin injection (subcutaneous)  5,000 Units Subcutaneous Q8H   insulin aspart  0-15 Units Subcutaneous TID WC   insulin glargine-yfgn  25 Units Subcutaneous Daily   levothyroxine  150 mcg Oral QAC breakfast   mouth rinse  15 mL Mouth Rinse 4 times per day   pantoprazole  40 mg Oral BID   sodium bicarbonate  650 mg Oral BID   tamsulosin  0.4 mg Oral Daily   Continuous Infusions:  sodium chloride     cefTRIAXone (ROCEPHIN)  IV 2 g (02/28/22 2118)     LOS: 2 days    Time spent: 40 minutes    Irine Seal, MD [facility]   To contact the attending provider between 7A-7P or the covering provider during after hours 7P-7A, please log into the web site [email or web address] and access using universal [place] password for that web site. If you do not have the password, please call the hospital operator.  03/01/2022, 1:19 PM

## 2022-03-01 NOTE — Progress Notes (Signed)
PT refused CPAP.  

## 2022-03-02 DIAGNOSIS — E861 Hypovolemia: Secondary | ICD-10-CM | POA: Diagnosis not present

## 2022-03-02 DIAGNOSIS — I9589 Other hypotension: Secondary | ICD-10-CM | POA: Diagnosis not present

## 2022-03-02 LAB — RENAL FUNCTION PANEL
Albumin: 1.9 g/dL — ABNORMAL LOW (ref 3.5–5.0)
Anion gap: 4 — ABNORMAL LOW (ref 5–15)
BUN: 35 mg/dL — ABNORMAL HIGH (ref 8–23)
CO2: 20 mmol/L — ABNORMAL LOW (ref 22–32)
Calcium: 7.4 mg/dL — ABNORMAL LOW (ref 8.9–10.3)
Chloride: 113 mmol/L — ABNORMAL HIGH (ref 98–111)
Creatinine, Ser: 2.08 mg/dL — ABNORMAL HIGH (ref 0.61–1.24)
GFR, Estimated: 33 mL/min — ABNORMAL LOW (ref >60)
Glucose, Bld: 181 mg/dL — ABNORMAL HIGH (ref 70–99)
Phosphorus: 2.3 mg/dL — ABNORMAL LOW (ref 2.5–4.6)
Potassium: 4.6 mmol/L (ref 3.5–5.1)
Sodium: 137 mmol/L (ref 135–145)

## 2022-03-02 LAB — GLUCOSE, CAPILLARY
Glucose-Capillary: 171 mg/dL — ABNORMAL HIGH (ref 70–99)
Glucose-Capillary: 350 mg/dL — ABNORMAL HIGH (ref 70–99)
Glucose-Capillary: 164 mg/dL — ABNORMAL HIGH (ref 70–99)
Glucose-Capillary: 117 mg/dL — ABNORMAL HIGH (ref 70–99)

## 2022-03-02 LAB — CBC
HCT: 30.8 % — ABNORMAL LOW (ref 39.0–52.0)
Hemoglobin: 10.3 g/dL — ABNORMAL LOW (ref 13.0–17.0)
MCH: 31.6 pg (ref 26.0–34.0)
MCHC: 33.4 g/dL (ref 30.0–36.0)
MCV: 94.5 fL (ref 80.0–100.0)
Platelets: 135 10*3/uL — ABNORMAL LOW (ref 150–400)
RBC: 3.26 MIL/uL — ABNORMAL LOW (ref 4.22–5.81)
RDW: 12.6 % (ref 11.5–15.5)
WBC: 6.8 10*3/uL (ref 4.0–10.5)
nRBC: 0 % (ref 0.0–0.2)

## 2022-03-02 LAB — MAGNESIUM: Magnesium: 1.6 mg/dL — ABNORMAL LOW (ref 1.7–2.4)

## 2022-03-02 MED ORDER — INSULIN ASPART 100 UNIT/ML IJ SOLN
4.0000 [IU] | Freq: Three times a day (TID) | INTRAMUSCULAR | Status: DC
  Administered 2022-03-02 – 2022-03-04 (×7): 4 [IU] via SUBCUTANEOUS

## 2022-03-02 MED ORDER — K PHOS MONO-SOD PHOS DI & MONO 155-852-130 MG PO TABS
500.0000 mg | ORAL_TABLET | Freq: Three times a day (TID) | ORAL | Status: DC
  Administered 2022-03-02 (×3): 500 mg via ORAL
  Filled 2022-03-02 (×4): qty 2

## 2022-03-02 MED ORDER — MAGNESIUM SULFATE 2 GM/50ML IV SOLN
2.0000 g | Freq: Once | INTRAVENOUS | Status: AC
  Administered 2022-03-02: 2 g via INTRAVENOUS
  Filled 2022-03-02: qty 50

## 2022-03-02 NOTE — Inpatient Diabetes Management (Signed)
Inpatient Diabetes Program Recommendations  AACE/ADA: New Consensus Statement on Inpatient Glycemic Control (2015)  Target Ranges:  Prepandial:   less than 140 mg/dL      Peak postprandial:   less than 180 mg/dL (1-2 hours)      Critically ill patients:  140 - 180 mg/dL   Lab Results  Component Value Date   GLUCAP 171 (H) 03/02/2022   HGBA1C 6.3 (H) 01/29/2022    Review of Glycemic Control  Latest Reference Range & Units 03/01/22 12:28 03/01/22 16:08 03/01/22 20:19 03/02/22 07:31  Glucose-Capillary 70 - 99 mg/dL 183 (H) 251 (H) 330 (H) 171 (H)  (H): Data is abnormally high Diabetes history: DM2 Outpatient Diabetes medications: Lantus 25 units daily, Metformin 1000 mg BID, Actos 15 mg daily Current orders for Inpatient glycemic control: Semglee 25 units daily, Novolog 0-15 units TID with meals & HS; Decadron 2 mg TID   Inpatient Diabetes Program Recommendations:     Insulin: If steroids are continued as ordered, consider ordering Novolog 4 units TID with meals for meal coverage if patient eats at least 50% of meals.   Thanks,  Bronson Curb, MSN, RNC-OB Diabetes Coordinator [phone] (8a-5p)

## 2022-03-02 NOTE — Progress Notes (Signed)
Mobility Specialist - Progress Note   03/02/22 1150  Mobility  Activity Ambulated with assistance in hallway  Level of Assistance Standby assist, set-up cues, supervision of patient - no hands on  Assistive Device Front wheel walker  Distance Ambulated (ft) 80 ft  Activity Response Tolerated well  Mobility Referral Yes  $Mobility charge 1 Mobility   Pt received in bed and agreeable to mobility. Pt is MinA for bed mobility. No complaints during session. Pt to bed after session with all needs met.   [facility]

## 2022-03-02 NOTE — Care Management Important Message (Signed)
Important Message  Patient Details IM Letter given. Name: Velma Hanna MRN: [id number] Date of Birth: [birth year]   Medicare Important Message Given:  Yes     Kerin Salen 03/02/2022, 11:47 AM

## 2022-03-02 NOTE — Progress Notes (Signed)
PT refused CPAP.  

## 2022-03-02 NOTE — NC FL2 (Signed)
[place] LEVEL OF CARE FORM     IDENTIFICATION  Patient Name: Patrick Rocha Birthdate: [birth year] Sex: male Admission Date (Current Location): 02/27/2022  [facility] and [place] Number:  Herbalist and Address:  [facility],  [place], [place]      Provider Number: [id number]  Attending Physician Name and Address:  Shelly Coss, MD  Relative Name and Phone Number:  Desma Mcgregor [phone]    Current Level of Care: SNF Recommended Level of Care: [place] Prior Approval Number:    Date Approved/Denied:   PASRR Number:    Discharge Plan: SNF    Current Diagnoses: Patient Active Problem List   Diagnosis Date Noted   Acute renal failure ([place]) 02/28/2022   Paroxysmal atrial fibrillation ([place]) 02/28/2022   Hypothyroidism 02/28/2022   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/2022   Type 2 diabetes mellitus with chronic kidney disease, with long-term current use of insulin ([place]) 36/64/4034   Acute metabolic encephalopathy 74/25/9563   Urinary tract infection without hematuria 02/28/2022   Hypotension 02/27/2022   Dehydration 02/27/2022   Brain mass 01/29/2022   Morbid (severe) obesity due to excess calories ([place]) 01/29/2022   Hypertensive emergency 01/29/2022   Vertigo 01/29/2022   Stage 3b chronic kidney disease (CKD) (HCC) - baseline SCr 1.7-1.9 01/29/2022   COPD (chronic obstructive pulmonary disease) ([place]) 01/16/2018   Left renal mass 03/18/2016   OSA on CPAP 12/10/2015   Benign prostatic hyperplasia 10/20/2010   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   Controlled type 2 diabetes mellitus with stage 3 chronic kidney disease, without long-term current use of insulin ([place]) 07/20/2010   Hypertension 07/20/2010   Persistent atrial fibrillation ([place]) 07/20/2010   Esophageal reflux 12/06/2006    Orientation RESPIRATION BLADDER Height & Weight     Self, Time, Situation, Place  Normal Incontinent Weight:  119.6 kg Height:  '5\' 9"'$  (175.3 cm)  BEHAVIORAL SYMPTOMS/MOOD NEUROLOGICAL BOWEL NUTRITION STATUS     (n/a) Continent Diet  AMBULATORY STATUS COMMUNICATION OF NEEDS Skin   Limited Assist Verbally Skin abrasions (bilateral knees)                       Personal Care Assistance Level of Assistance  Bathing, Feeding, Dressing Bathing Assistance: Limited assistance Feeding assistance: Independent Dressing Assistance: Limited assistance     Functional Limitations Info  Sight, Hearing, Speech Sight Info: Impaired Hearing Info: Adequate Speech Info: Adequate    SPECIAL CARE FACTORS FREQUENCY  PT (By licensed PT), OT (By licensed OT)     PT Frequency: 5x/wk OT Frequency: 5x/wk            Contractures Contractures Info: Present    Additional Factors Info  Code Status, Allergies, Psychotropic, Insulin Sliding Scale, Isolation Precautions, Suctioning Needs Code Status Info: Full Allergies Info: No known Allergies Psychotropic Info: see discharge summary Insulin Sliding Scale Info: see discharge summary Isolation Precautions Info: n/a Suctioning Needs: n/a   Current Medications (03/02/2022):  This is the current hospital active medication list Current Facility-Administered Medications  Medication Dose Route Frequency Provider Last Rate Last Admin   0.9 %  sodium chloride infusion   Intravenous Continuous Eugenie Filler, MD 100 mL/hr at 03/02/22 0933 New Bag at 03/02/22 0933   acetaminophen (TYLENOL) tablet 650 mg  650 mg Oral Q6H PRN Clance Boll, MD       Or   acetaminophen (TYLENOL) suppository 650 mg  650 mg Rectal Q6H PRN Clance Boll,  MD       albuterol (PROVENTIL) (2.5 MG/3ML) 0.083% nebulizer solution 2.5 mg  2.5 mg Nebulization Q2H PRN Clance Boll, MD       atorvastatin (LIPITOR) tablet 40 mg  40 mg Oral Daily Eugenie Filler, MD   40 mg at 03/02/22 0951   carvedilol (COREG) tablet 12.5 mg  12.5 mg Oral BID WC Eugenie Filler, MD    12.5 mg at 03/02/22 0951   dexamethasone (DECADRON) tablet 2 mg  2 mg Oral TID Myles Rosenthal A, MD   2 mg at 03/02/22 1526   escitalopram (LEXAPRO) tablet 10 mg  10 mg Oral Daily Eugenie Filler, MD   10 mg at 03/02/22 0951   ferrous sulfate tablet 325 mg  325 mg Oral BID WC Eugenie Filler, MD   325 mg at 03/02/22 0951   heparin injection 5,000 Units  5,000 Units Subcutaneous Q8H Eugenie Filler, MD   5,000 Units at 03/02/22 1346   insulin aspart (novoLOG) injection 0-15 Units  0-15 Units Subcutaneous TID WC Clance Boll, MD   11 Units at 03/02/22 1225   insulin aspart (novoLOG) injection 0-5 Units  0-5 Units Subcutaneous QHS Eugenie Filler, MD   4 Units at 03/01/22 2240   insulin aspart (novoLOG) injection 4 Units  4 Units Subcutaneous TID WC Shelly Coss, MD   4 Units at 03/02/22 1227   insulin glargine-yfgn (SEMGLEE) injection 25 Units  25 Units Subcutaneous Daily Clance Boll, MD   25 Units at 03/02/22 0951   levothyroxine (SYNTHROID) tablet 150 mcg  150 mcg Oral QAC breakfast Clance Boll, MD   150 mcg at 03/02/22 0547   meclizine (ANTIVERT) tablet 25 mg  25 mg Oral TID PRN Eugenie Filler, MD       ondansetron [facility]) tablet 4 mg  4 mg Oral Q6H PRN Clance Boll, MD       Or   ondansetron [facility]) injection 4 mg  4 mg Intravenous Q6H PRN Clance Boll, MD       Oral care mouth rinse  15 mL Mouth Rinse 4 times per day Eugenie Filler, MD   15 mL at 03/02/22 1526   Oral care mouth rinse  15 mL Mouth Rinse PRN Eugenie Filler, MD       pantoprazole (PROTONIX) EC tablet 40 mg  40 mg Oral BID Myles Rosenthal A, MD   40 mg at 03/02/22 0950   phosphorus (K PHOS NEUTRAL) tablet 500 mg  500 mg Oral TID Shelly Coss, MD   500 mg at 03/02/22 1525   sodium bicarbonate tablet 650 mg  650 mg Oral BID Eugenie Filler, MD   650 mg at 03/02/22 0951   tamsulosin (FLOMAX) capsule 0.4 mg  0.4 mg Oral Daily Eugenie Filler, MD   0.4 mg at  03/02/22 1610     Discharge Medications: Please see discharge summary for a list of discharge medications.  Relevant Imaging Results:  Relevant Lab Results:   Additional Information SS# [id number]  Angelita Ingles, RN

## 2022-03-02 NOTE — TOC Initial Note (Signed)
Transition of Care [facility]) - Initial/Assessment Note    Patient Details  Name: Patrick Rocha MRN: [id number] Date of Birth: 08-Jun-1945  [facility] CM/SW Contact:    Angelita Ingles, RN Phone Number:[phone]  03/02/2022, 4:02 PM  Clinical Narrative:                 [facility] acknowledges consult for SNF placement. Patient is from home where he normally functions independently. Patient states that he does have a PCP Dalton, Kathlene November, MD    that he follows on a regular basis. Patient states that he does have access to medications and that his medications are affordable. Patient reports that he does have transportation to appointments. CM at bedside to make patient aware of SNF recommendation. Patient states that he understands that he will need to have short term rehab and is agreeable to CM faxing out his information. Patient does report that he would like the CM to follow up with his daughter in law Dian Situ. Cm has faxed out patient infor for SNF bed offers and will follow up with daughter in law.         Patient Goals and CMS Choice            Expected Discharge Plan and Services                                              Prior Living Arrangements/Services                       Activities of Daily Living Home Assistive Devices/Equipment: Cane (specify quad or straight) ADL Screening (condition at time of admission) Patient's cognitive ability adequate to safely complete daily activities?: Yes Is the patient deaf or have difficulty hearing?: Yes Does the patient have difficulty seeing, even when wearing glasses/contacts?: No Does the patient have difficulty concentrating, remembering, or making decisions?: No Patient able to express need for assistance with ADLs?: Yes Does the patient have difficulty dressing or bathing?: No Independently performs ADLs?: No Communication: Independent Dressing (OT): Independent Grooming:  Independent Feeding: Independent Bathing: Independent Toileting: Independent In/Out Bed: Needs assistance Is this a change from baseline?: Change from baseline, expected to last >3 days Walks in Home: Needs assistance Is this a change from baseline?: Change from baseline, expected to last >3 days Does the patient have difficulty walking or climbing stairs?: Yes Weakness of Legs: Both Weakness of Arms/Hands: Both  Permission Sought/Granted                  Emotional Assessment              Admission diagnosis:  Dehydration [E86.0] AKI (acute kidney injury) ([place]) [N17.9] Hypotension [I95.9] Hypotension due to hypovolemia [I95.89, E86.1] Patient Active Problem List   Diagnosis Date Noted   Acute renal failure ([place]) 02/28/2022   Paroxysmal atrial fibrillation ([place]) 02/28/2022   Hypothyroidism 02/28/2022   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/2022   Type 2 diabetes mellitus with chronic kidney disease, with long-term current use of insulin ([place]) 95/18/8416   Acute metabolic encephalopathy 60/63/0160   Urinary tract infection without hematuria 02/28/2022   Hypotension 02/27/2022   Dehydration 02/27/2022   Brain mass 01/29/2022   Morbid (severe) obesity due to excess calories ([place]) 01/29/2022   Hypertensive emergency 01/29/2022   Vertigo 01/29/2022   Stage 3b chronic  kidney disease (CKD) ([place]) - baseline SCr 1.7-1.9 01/29/2022   COPD (chronic obstructive pulmonary disease) ([place]) 01/16/2018   Left renal mass 03/18/2016   OSA on CPAP 12/10/2015   Benign prostatic hyperplasia 10/20/2010   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   Controlled type 2 diabetes mellitus with stage 3 chronic kidney disease, without long-term current use of insulin ([place]) 07/20/2010   Hypertension 07/20/2010   Persistent atrial fibrillation ([place]) 07/20/2010   Esophageal reflux 12/06/2006   PCP:  Elliot Dally, MD Pharmacy:   [place], [place] [address] Phone: [phone] Fax: [facility] [place], [place] - [address] AT [facility] [address] [place] [address] Phone: [phone] Fax: [phone]  [place], [place] [address] Phone: [phone] Fax: [place] [address] [place] [address] Phone: [phone] Fax: [phone]     Social Determinants of Health (SDOH) Social History: SDOH Screenings   Food Insecurity: No Food Insecurity (03/01/2022)  Housing: Low Risk  (03/01/2022)  Transportation Needs: No Transportation Needs (03/01/2022)  Utilities: Not At Risk (03/01/2022)  Tobacco Use: Medium Risk (03/01/2022)   SDOH Interventions:     Readmission Risk Interventions     No data to display

## 2022-03-02 NOTE — Progress Notes (Signed)
PROGRESS NOTE  Patrick Rocha  LNL:[id number] DOB: [birth year] DOA: 02/27/2022 PCP: Elliot Dally, MD   Brief Narrative: Patient is a 77 year old male with history of morbid obesity, A-fib on Xarelto currently on hold in anticipation of brain biopsy, hypertension, OSA on CPAP, CKD stage IIIb with baseline creatinine of 1.7-1.9, type 2 diabetes, COPD, GERD who presented to the Emergency Department with weakness, confusion, hematuria.  On presentation he was hypertensive with systolic blood pressure in the range of 60s, hypoxic.  Lab work showed AKI.  Started on IV fluids.  Assessment & Plan:  Principal Problem:   Hypotension Active Problems:   Dehydration   Acute renal failure (HCC)   Paroxysmal atrial fibrillation (HCC)   Hypothyroidism   Brain lesion   Type 2 diabetes mellitus with chronic kidney disease, with long-term current use of insulin (HCC)   Acute metabolic encephalopathy   Urinary tract infection without hematuria  AKI on CKD stage IIIb: Baseline creatinine of 1.7-1.9.  Hypertensive on presentation.  Creatinine was in the range of 3 in presentation.  Kidney function improving with IV fluids.  AKI likely associated with poor oral intake, low blood pressure.  Recent history of UTI: Urine culture has not shown any growth.  Got 3 days of antibiotics with ceftriaxone  Acute metabolic encephalopathy: Presented with confusion, generalized weakness.  Was recently diagnosed with and was on ciprofloxacin.  Chest x-ray did not show pneumonia.  Ciprofloxacin discontinued.  Currently he is alert and orinted  Diabetes 2: Recent A1c of 6.3.  Continue current insulin regimen.  Monitor blood sugars  Hypomagnesemia/hypophosphatemia: Currently being supplemented and monitor.  Hypothyroidism: On Synthyroid  GERD: PPI  History of paroxysmal A-fib: Status post ablation 2004.  On Coreg for rate control.  Was on Xarelto but currently on hold due to anticipation of brain biopsy that  was planned for 03/01/22  Hypertension: Hypotensive on presentation.  Antihypertensives on hold  COPD: Currently not in exacerbation.  Continue bronchodilators as needed  OSA: CPAP nightly  Brain lesion with edema: Patient noted to have planned biopsy scheduled for 03/01/2022 prior to admission.Continue Decadron/steroids.Informed neurosurgery, Dr. Reatha Armour of patient's admission who recommended planned biopsy will likely need to be rescheduled. CT head ordered per neurosurgery in anticipation of biopsy.Showed unchanged inferior right frontal and insular hypoattenuation, compatible with known mass.  Debility/deconditioning: Patient seen by PT and OT and recommended SNF on discharge         DVT prophylaxis:heparin injection 5,000 Units Start: 02/28/22 2200 SCDs Start: 02/27/22 2109     Code Status: Full Code  Family Communication: None at bedside  Patient status:Inpatient  Patient is from :Home  Anticipated discharge to:SNF  Estimated DC date:1-2 days   Consultants: None  Procedures:None  Antimicrobials:  Anti-infectives (From admission, onward)    Start     Dose/Rate Route Frequency Ordered Stop   02/27/22 2200  cefTRIAXone (ROCEPHIN) 2 g in sodium chloride 0.9 % 100 mL IVPB        2 g 200 mL/hr over 30 Minutes Intravenous Every 24 hours 02/27/22 2102 03/02/22 0335       Subjective: Patient seen and examined at bedside today.  Hemodynamically stable.  Very comfortable, lying in bed.  Alert /oriented.  Denies any complaints  Objective: Vitals:   03/01/22 1543 03/01/22 2016 03/02/22 0500 03/02/22 0512  BP:  (!) 101/58  137/79  Pulse: 67 95  93  Resp:  18  18  Temp:  97.9 F (36.6 C)  97.8 F (36.6  C)  TempSrc:  Oral  Oral  SpO2:  99%  99%  Weight:   119.6 kg   Height:        Intake/Output Summary (Last 24 hours) at 03/02/2022 1028 Last data filed at 03/02/2022 3154 Gross per 24 hour  Intake 2633.72 ml  Output 500 ml  Net 2133.72 ml   Filed Weights    02/27/22 1646 03/01/22 0600 03/02/22 0500  Weight: 124.7 kg 116 kg 119.6 kg    Examination:  General exam: Overall comfortable, not in distress,obese HEENT: PERRL Respiratory system:  no wheezes or crackles  Cardiovascular system: S1 & S2 heard, RRR.  Gastrointestinal system: Abdomen is nondistended, soft and nontender. Central nervous system: Alert and oriented Extremities: No edema, no clubbing ,no cyanosis Skin: No rashes, no ulcers,no icterus     Data Reviewed: I have personally reviewed following labs and imaging studies  CBC: Recent Labs  Lab 02/25/22 0900 02/27/22 1706 02/27/22 1712 02/28/22 1455 03/01/22 0621 03/02/22 0613  WBC 9.4 8.4  --  5.9 6.6 6.8  NEUTROABS  --  6.2  --   --  5.8  --   HGB 14.7 13.5 13.3 11.5* 11.7* 10.3*  HCT 43.0 40.0 39.0 34.4* 34.7* 30.8*  MCV 93.1 92.4  --  93.5 92.3 94.5  PLT 242 207  --  147* 153 008*   Basic Metabolic Panel: Recent Labs  Lab 02/25/22 0900 02/27/22 1706 02/27/22 1712 02/28/22 1455 03/01/22 0621 03/02/22 0613  NA 134* 132* 134* 134* 137 137  K 5.5* 4.4 4.7 5.1 4.5 4.6  CL 106 106 107 109 111 113*  CO2 16* 18*  --  17* 18* 20*  GLUCOSE 312* 134* 127* 283* 123* 181*  BUN 77* 71* 68* 56* 44* 35*  CREATININE 2.71* 3.12* 3.50* 2.63* 2.36* 2.08*  CALCIUM 9.0 8.4*  --  7.3* 7.5* 7.4*  MG  --   --   --  1.4* 1.7 1.6*  PHOS  --   --   --   --   --  2.3*     Recent Results (from the past 240 hour(s))  Resp panel by RT-PCR (RSV, Flu A&B, Covid) Anterior Nasal Swab     Status: None   Collection Time: 02/27/22  6:14 PM   Specimen: Anterior Nasal Swab  Result Value Ref Range Status   SARS Coronavirus 2 by RT PCR NEGATIVE NEGATIVE Final    Comment: (NOTE) SARS-CoV-2 target nucleic acids are NOT DETECTED.  The SARS-CoV-2 RNA is generally detectable in upper respiratory specimens during the acute phase of infection. The lowest concentration of SARS-CoV-2 viral copies this assay can detect is 138 copies/mL. A  negative result does not preclude SARS-Cov-2 infection and should not be used as the sole basis for treatment or other patient management decisions. A negative result may occur with  improper specimen collection/handling, submission of specimen other than nasopharyngeal swab, presence of viral mutation(s) within the areas targeted by this assay, and inadequate number of viral copies(<138 copies/mL). A negative result must be combined with clinical observations, patient history, and epidemiological information. The expected result is Negative.  Fact Sheet for Patients:  EntrepreneurPulse.com.au  Fact Sheet for Healthcare Providers:  IncredibleEmployment.be  This test is no t yet approved or cleared by the [place] FDA and  has been authorized for detection and/or diagnosis of SARS-CoV-2 by FDA under an Emergency Use Authorization (EUA). This EUA will remain  in effect (meaning this test can be used) for the duration  of the COVID-19 declaration under Section 564(b)(1) of the Act, 21 U.S.C.section 360bbb-3(b)(1), unless the authorization is terminated  or revoked sooner.       Influenza A by PCR NEGATIVE NEGATIVE Final   Influenza B by PCR NEGATIVE NEGATIVE Final    Comment: (NOTE) The Xpert Xpress SARS-CoV-2/FLU/RSV plus assay is intended as an aid in the diagnosis of influenza from Nasopharyngeal swab specimens and should not be used as a sole basis for treatment. Nasal washings and aspirates are unacceptable for Xpert Xpress SARS-CoV-2/FLU/RSV testing.  Fact Sheet for Patients: EntrepreneurPulse.com.au  Fact Sheet for Healthcare Providers: IncredibleEmployment.be  This test is not yet approved or cleared by the [place] FDA and has been authorized for detection and/or diagnosis of SARS-CoV-2 by FDA under an Emergency Use Authorization (EUA). This EUA will remain in effect (meaning this test can  be used) for the duration of the COVID-19 declaration under Section 564(b)(1) of the Act, 21 U.S.C. section 360bbb-3(b)(1), unless the authorization is terminated or revoked.     Resp Syncytial Virus by PCR NEGATIVE NEGATIVE Final    Comment: (NOTE) Fact Sheet for Patients: EntrepreneurPulse.com.au  Fact Sheet for Healthcare Providers: IncredibleEmployment.be  This test is not yet approved or cleared by the [place] FDA and has been authorized for detection and/or diagnosis of SARS-CoV-2 by FDA under an Emergency Use Authorization (EUA). This EUA will remain in effect (meaning this test can be used) for the duration of the COVID-19 declaration under Section 564(b)(1) of the Act, 21 U.S.C. section 360bbb-3(b)(1), unless the authorization is terminated or revoked.  Performed at [facility], [place] [address] [place], [place] [address]   Urine Culture     Status: None   Collection Time: 02/28/22  9:29 AM   Specimen: Urine, Catheterized  Result Value Ref Range Status   Specimen Description   Final    URINE, CATHETERIZED Performed at [place] [address], [place], [place] [address]    Special Requests   Final    NONE Performed at [facility], [place] [address] [place], [place] [address]    Culture   Final    NO GROWTH Performed at [facility], [place] [address] [place], [place] [address]    Report Status 03/01/2022 FINAL  Final  Culture, blood (Routine X 2) w Reflex to ID Panel     Status: None (Preliminary result)   Collection Time: 02/28/22  4:14 PM   Specimen: BLOOD RIGHT HAND  Result Value Ref Range Status   Specimen Description   Final    BLOOD RIGHT HAND Performed at [facility], [place] [address], [place], [place] [address]    Special Requests   Final    AEROBIC BOTTLE ONLY Blood Culture results may not be optimal due to an inadequate volume of blood  received in culture bottles Performed at [place] [address], [place], [place] [address]    Culture   Final    NO GROWTH 2 DAYS Performed at [place] [address], [place], [place] [address]    Report Status PENDING  Incomplete  Culture, blood (Routine X 2) w Reflex to ID Panel     Status: None (Preliminary result)   Collection Time: 02/28/22  5:05 PM   Specimen: BLOOD RIGHT ARM  Result Value Ref Range Status   Specimen Description   Final    BLOOD RIGHT ARM Performed at [facility], [address], [place], [place] [address]    Special Requests   Final    BOTTLES DRAWN AEROBIC ONLY Blood Culture results may not be optimal due to an inadequate volume of blood received in culture bottles Performed at [place] [address], [place], [place] [address]    Culture   Final    NO GROWTH 2 DAYS Performed at [place] [address], [place], [place] [address]    Report Status PENDING  Incomplete     Radiology Studies: CT DBS HEAD W/O CONTRAST (1MM)  Result Date: 02/28/2022 CLINICAL DATA:  Brain lesion.  Preop evaluation.  Weakness. EXAM: CT HEAD WITHOUT CONTRAST TECHNIQUE: Contiguous axial images were obtained from the base of the skull through the vertex without intravenous contrast. RADIATION DOSE REDUCTION: This exam was performed according to the departmental dose-optimization program which includes automated exposure control, adjustment of the mA and/or kV according to patient size and/or use of iterative reconstruction technique. COMPARISON:  Head CT 02/27/2022.  MRI brain 01/29/2022. FINDINGS: Brain: Unchanged region of predominantly subcortical hypoattenuation in the inferior right frontal lobe extending into the right insula. No acute intracranial hemorrhage. Cortical gray-white differentiation is otherwise preserved. No hydrocephalus. No extra-axial collection. Basilar cisterns are patent. Vascular: No  hyperdense vessel or unexpected calcification. Skull: Normal. 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 Mastoids are well aerated. Orbits are unremarkable. Other: None. IMPRESSION: 1. No acute intracranial hemorrhage. 2. Unchanged inferior right frontal and insular hypoattenuation, compatible with known mass. Electronically Signed   By: Emmit Alexanders M.D.   On: 02/28/2022 11:55    Scheduled Meds:  atorvastatin  40 mg Oral Daily   carvedilol  12.5 mg Oral BID WC   dexamethasone  2 mg Oral TID   escitalopram  10 mg Oral Daily   ferrous sulfate  325 mg Oral BID WC   heparin injection (subcutaneous)  5,000 Units Subcutaneous Q8H   insulin aspart  0-15 Units Subcutaneous TID WC   insulin aspart  0-5 Units Subcutaneous QHS   insulin glargine-yfgn  25 Units Subcutaneous Daily   levothyroxine  150 mcg Oral QAC breakfast   mouth rinse  15 mL Mouth Rinse 4 times per day   pantoprazole  40 mg Oral BID   phosphorus  500 mg Oral TID   sodium bicarbonate  650 mg Oral BID   tamsulosin  0.4 mg Oral Daily   Continuous Infusions:  sodium chloride 100 mL/hr at 03/02/22 0933   magnesium sulfate bolus IVPB 2 g (03/02/22 1000)     LOS: 3 days   Shelly Coss, MD [facility] P1/24/2024, 10:28 AM

## 2022-03-03 DIAGNOSIS — E861 Hypovolemia: Secondary | ICD-10-CM | POA: Diagnosis not present

## 2022-03-03 DIAGNOSIS — I9589 Other hypotension: Secondary | ICD-10-CM | POA: Diagnosis not present

## 2022-03-03 LAB — CBC
HCT: 33.4 % — ABNORMAL LOW (ref 39.0–52.0)
Hemoglobin: 11 g/dL — ABNORMAL LOW (ref 13.0–17.0)
MCH: 32 pg (ref 26.0–34.0)
MCHC: 32.9 g/dL (ref 30.0–36.0)
MCV: 97.1 fL (ref 80.0–100.0)
Platelets: 137 10*3/uL — ABNORMAL LOW (ref 150–400)
RBC: 3.44 MIL/uL — ABNORMAL LOW (ref 4.22–5.81)
RDW: 12.7 % (ref 11.5–15.5)
WBC: 5.5 10*3/uL (ref 4.0–10.5)
nRBC: 0 % (ref 0.0–0.2)

## 2022-03-03 LAB — BASIC METABOLIC PANEL
Anion gap: 6 (ref 5–15)
BUN: 32 mg/dL — ABNORMAL HIGH (ref 8–23)
CO2: 19 mmol/L — ABNORMAL LOW (ref 22–32)
Calcium: 7.2 mg/dL — ABNORMAL LOW (ref 8.9–10.3)
Chloride: 113 mmol/L — ABNORMAL HIGH (ref 98–111)
Creatinine, Ser: 1.8 mg/dL — ABNORMAL HIGH (ref 0.61–1.24)
GFR, Estimated: 39 mL/min — ABNORMAL LOW (ref >60)
Glucose, Bld: 132 mg/dL — ABNORMAL HIGH (ref 70–99)
Potassium: 4.4 mmol/L (ref 3.5–5.1)
Sodium: 138 mmol/L (ref 135–145)

## 2022-03-03 LAB — GLUCOSE, CAPILLARY
Glucose-Capillary: 131 mg/dL — ABNORMAL HIGH (ref 70–99)
Glucose-Capillary: 249 mg/dL — ABNORMAL HIGH (ref 70–99)
Glucose-Capillary: 193 mg/dL — ABNORMAL HIGH (ref 70–99)
Glucose-Capillary: 288 mg/dL — ABNORMAL HIGH (ref 70–99)

## 2022-03-03 LAB — PHOSPHORUS: Phosphorus: 3.4 mg/dL (ref 2.5–4.6)

## 2022-03-03 LAB — MAGNESIUM: Magnesium: 1.8 mg/dL (ref 1.7–2.4)

## 2022-03-03 MED ORDER — POLYETHYLENE GLYCOL 3350 17 G PO PACK
17.0000 g | PACK | Freq: Every day | ORAL | Status: DC
  Administered 2022-03-03: 17 g via ORAL
  Filled 2022-03-03 (×2): qty 1

## 2022-03-03 MED ORDER — RIVAROXABAN 15 MG PO TABS
15.0000 mg | ORAL_TABLET | Freq: Every day | ORAL | Status: DC
  Administered 2022-03-03: 15 mg via ORAL
  Filled 2022-03-03: qty 1

## 2022-03-03 MED ORDER — SENNOSIDES-DOCUSATE SODIUM 8.6-50 MG PO TABS
1.0000 | ORAL_TABLET | Freq: Two times a day (BID) | ORAL | Status: DC
  Administered 2022-03-03 – 2022-03-04 (×3): 1 via ORAL
  Filled 2022-03-03 (×3): qty 1

## 2022-03-03 NOTE — TOC Progression Note (Addendum)
Transition of Care [facility]) - Progression Note    Patient Details  Name: Patrick Rocha MRN: [id number] Date of Birth: [birth year]  Transition of Care [facility]) CM/SW [place], RN Phone Number:[phone]  03/03/2022, 1:37 PM  Clinical Narrative:    Patients daughter in law Michaela at bedside. CM has provided her with a list of accepting SNF's. Daughter in law would like an hour to review facilities and will call CM with choice.   [place] completed # [id number]  1619 CM received call from daughter in law with choice of [place]. Cm  spoke with Star at [facility]. Per Star patient can come to facility tomorrow. Insurance auth initiated. MD and nurse updated.       Expected Discharge Plan and Services                                               Social Determinants of Health (SDOH) Interventions SDOH Screenings   Food Insecurity: No Food Insecurity (03/01/2022)  Housing: Low Risk  (03/01/2022)  Transportation Needs: No Transportation Needs (03/01/2022)  Utilities: Not At Risk (03/01/2022)  Tobacco Use: Medium Risk (03/01/2022)    Readmission Risk Interventions     No data to display

## 2022-03-03 NOTE — Progress Notes (Signed)
PT refused cpap tonight.

## 2022-03-03 NOTE — Progress Notes (Signed)
Pt ambulated with one assist and walker. Pt states that he needed to have a BM and hasn't had one in three days. When this RN returned to check on pt, he states that "that one was hard to get out" and RN notices bright, red blood on tissue. Pt denies history of hemmroids and when RN checks toilet, the bowl is full of brownish red content. When asked if pt has a history of bloody stool he states, "not since I had it with the diarrhea". When asked when that was, pt states, "three days ago when I was coming in here." There is no documented history of bloody stool in the patient's history, nor with this admission. NP on call notified, vitals obatined. Pt denies dizziness or lightheadedness at this time. BP 133/79 with HR 98. NP placed orders and will continue to monitor.

## 2022-03-03 NOTE — Progress Notes (Signed)
PROGRESS NOTE  Patrick Rocha  JHE:[id number] DOB: 1945-03-26 DOA: 02/27/2022 PCP: Elliot Dally, MD   Brief Narrative: Patient is a 77 year old male with history of morbid obesity, A-fib on Xarelto currently on hold in anticipation of brain biopsy, hypertension, OSA on CPAP, CKD stage IIIb with baseline creatinine of 1.7-1.9, type 2 diabetes, COPD, GERD who presented to the Emergency Department with weakness, confusion, hematuria.  On presentation he was hypertensive with systolic blood pressure in the range of 60s, hypoxic.  Lab work showed AKI.  Started on IV fluids. Significant improvement in the kidney function now.  PT/OT recommending SNF.  Medically stable for discharge whenever possible.  Assessment & Plan:  Principal Problem:   Hypotension Active Problems:   Dehydration   Acute renal failure (HCC)   Paroxysmal atrial fibrillation (HCC)   Hypothyroidism   Brain lesion   Type 2 diabetes mellitus with chronic kidney disease, with long-term current use of insulin (HCC)   Acute metabolic encephalopathy   Urinary tract infection without hematuria  AKI on CKD stage IIIb: Baseline creatinine of 1.7-1.9.  Hypotensive on presentation.  Creatinine was in the range of 3 in presentation.  Kidney function improving with IV fluids.  AKI likely associated with poor oral intake, low blood pressure. Continue gentle IV fluids for today if he is not discharged  Recent history of UTI: Urine culture has not shown any growth.  S/P  3 days of antibiotics with ceftriaxone  Acute metabolic encephalopathy: Presented with confusion, generalized weakness.  Was recently diagnosed with and was on ciprofloxacin.  Chest x-ray did not show pneumonia.  Ciprofloxacin discontinued.  Currently he is alert and orinted  Diabetes 2: Recent A1c of 6.3.  Continue current insulin regimen.  Monitor blood sugars  Hypomagnesemia/hypophosphatemia:  supplemented and corrected  Hypothyroidism: On  Synthyroid  GERD: PPI  History of paroxysmal A-fib: Status post ablation 2004.  On Coreg for rate control.  Was on Xarelto but currently on hold due to anticipation of brain biopsy that was planned for 03/01/22,needs rescheduling,so it will be  resumed(pharmacy consult placed)  Hypertension: Hypotensive on presentation.  Antihypertensives on hold,bp stable without any meds  COPD: Currently not in exacerbation.  Continue bronchodilators as needed  OSA: CPAP nightly  Brain lesion with edema: Patient noted to have planned biopsy scheduled for 03/01/2022 prior to admission.Continue Decadron/steroids.Informed neurosurgery, Dr. Reatha Armour of patient's admission who recommended planned biopsy will likely need to be rescheduled. CT head ordered per neurosurgery in anticipation of biopsy.Showed unchanged inferior right frontal and insular hypoattenuation, compatible with known mass.  Debility/deconditioning: Patient seen by PT and OT and recommended SNF on discharge         DVT prophylaxis:heparin injection 5,000 Units Start: 02/28/22 2200 SCDs Start: 02/27/22 2109     Code Status: Full Code  Family Communication: called and discussed with daughter-in-law on phone  Patient status:Inpatient  Patient is from :Home  Anticipated discharge to:SNF  Estimated DC date:whenever possible  Consultants: None  Procedures:None  Antimicrobials:  Anti-infectives (From admission, onward)    Start     Dose/Rate Route Frequency Ordered Stop   02/27/22 2200  cefTRIAXone (ROCEPHIN) 2 g in sodium chloride 0.9 % 100 mL IVPB        2 g 200 mL/hr over 30 Minutes Intravenous Every 24 hours 02/27/22 2102 03/02/22 0335       Subjective: Patient seen and examined at bedside today continues to remain very comfortable, no new complaints.  Lack of bowel movement for last  3 days.  Started on bowel regimen.  Alert and oriented  Objective: Vitals:   03/02/22 0512 03/02/22 1344 03/02/22 2105 03/03/22 0805   BP: 137/79 117/75 134/62 138/78  Pulse: 93 77 87 99  Resp: '18  20 19  '$ Temp: 97.8 F (36.6 C) 97.9 F (36.6 C) 97.7 F (36.5 C) 98.4 F (36.9 C)  TempSrc: Oral Oral Oral Oral  SpO2: 99% 98% 100% 97%  Weight:      Height:        Intake/Output Summary (Last 24 hours) at 03/03/2022 1019 Last data filed at 03/03/2022 0940 Gross per 24 hour  Intake 720 ml  Output 900 ml  Net -180 ml   Filed Weights   02/27/22 1646 03/01/22 0600 03/02/22 0500  Weight: 124.7 kg 116 kg 119.6 kg    Examination:  General exam: Overall comfortable, not in distress,obese HEENT: PERRL Respiratory system:  no wheezes or crackles  Cardiovascular system: S1 & S2 heard, RRR.  Gastrointestinal system: Abdomen is nondistended, soft and nontender. Central nervous system: Alert and oriented Extremities: No edema, no clubbing ,no cyanosis Skin: No rashes, no ulcers,no icterus       Data Reviewed: I have personally reviewed following labs and imaging studies  CBC: Recent Labs  Lab 02/25/22 0900 02/27/22 1706 02/27/22 1712 02/28/22 1455 03/01/22 0621 03/02/22 0613  WBC 9.4 8.4  --  5.9 6.6 6.8  NEUTROABS  --  6.2  --   --  5.8  --   HGB 14.7 13.5 13.3 11.5* 11.7* 10.3*  HCT 43.0 40.0 39.0 34.4* 34.7* 30.8*  MCV 93.1 92.4  --  93.5 92.3 94.5  PLT 242 207  --  147* 153 993*   Basic Metabolic Panel: Recent Labs  Lab 02/27/22 1706 02/27/22 1712 02/28/22 1455 03/01/22 0621 03/02/22 0613 03/03/22 0559  NA 132* 134* 134* 137 137 138  K 4.4 4.7 5.1 4.5 4.6 4.4  CL 106 107 109 111 113* 113*  CO2 18*  --  17* 18* 20* 19*  GLUCOSE 134* 127* 283* 123* 181* 132*  BUN 71* 68* 56* 44* 35* 32*  CREATININE 3.12* 3.50* 2.63* 2.36* 2.08* 1.80*  CALCIUM 8.4*  --  7.3* 7.5* 7.4* 7.2*  MG  --   --  1.4* 1.7 1.6* 1.8  PHOS  --   --   --   --  2.3* 3.4     Recent Results (from the past 240 hour(s))  Resp panel by RT-PCR (RSV, Flu A&B, Covid) Anterior Nasal Swab     Status: None   Collection Time:  02/27/22  6:14 PM   Specimen: Anterior Nasal Swab  Result Value Ref Range Status   SARS Coronavirus 2 by RT PCR NEGATIVE NEGATIVE Final    Comment: (NOTE) SARS-CoV-2 target nucleic acids are NOT DETECTED.  The SARS-CoV-2 RNA is generally detectable in upper respiratory specimens during the acute phase of infection. The lowest concentration of SARS-CoV-2 viral copies this assay can detect is 138 copies/mL. A negative result does not preclude SARS-Cov-2 infection and should not be used as the sole basis for treatment or other patient management decisions. A negative result may occur with  improper specimen collection/handling, submission of specimen other than nasopharyngeal swab, presence of viral mutation(s) within the areas targeted by this assay, and inadequate number of viral copies(<138 copies/mL). A negative result must be combined with clinical observations, patient history, and epidemiological information. The expected result is Negative.  Fact Sheet for Patients:  EntrepreneurPulse.com.au  Fact  Sheet for Healthcare Providers:  IncredibleEmployment.be  This test is no t yet approved or cleared by the [place] FDA and  has been authorized for detection and/or diagnosis of SARS-CoV-2 by FDA under an Emergency Use Authorization (EUA). This EUA will remain  in effect (meaning this test can be used) for the duration of the COVID-19 declaration under Section 564(b)(1) of the Act, 21 U.S.C.section 360bbb-3(b)(1), unless the authorization is terminated  or revoked sooner.       Influenza A by PCR NEGATIVE NEGATIVE Final   Influenza B by PCR NEGATIVE NEGATIVE Final    Comment: (NOTE) The Xpert Xpress SARS-CoV-2/FLU/RSV plus assay is intended as an aid in the diagnosis of influenza from Nasopharyngeal swab specimens and should not be used as a sole basis for treatment. Nasal washings and aspirates are unacceptable for Xpert Xpress  SARS-CoV-2/FLU/RSV testing.  Fact Sheet for Patients: EntrepreneurPulse.com.au  Fact Sheet for Healthcare Providers: IncredibleEmployment.be  This test is not yet approved or cleared by the [place] FDA and has been authorized for detection and/or diagnosis of SARS-CoV-2 by FDA under an Emergency Use Authorization (EUA). This EUA will remain in effect (meaning this test can be used) for the duration of the COVID-19 declaration under Section 564(b)(1) of the Act, 21 U.S.C. section 360bbb-3(b)(1), unless the authorization is terminated or revoked.     Resp Syncytial Virus by PCR NEGATIVE NEGATIVE Final    Comment: (NOTE) Fact Sheet for Patients: EntrepreneurPulse.com.au  Fact Sheet for Healthcare Providers: IncredibleEmployment.be  This test is not yet approved or cleared by the [place] FDA and has been authorized for detection and/or diagnosis of SARS-CoV-2 by FDA under an Emergency Use Authorization (EUA). This EUA will remain in effect (meaning this test can be used) for the duration of the COVID-19 declaration under Section 564(b)(1) of the Act, 21 U.S.C. section 360bbb-3(b)(1), unless the authorization is terminated or revoked.  Performed at [facility], [place] [address], [place], [place] [address]   Urine Culture     Status: None   Collection Time: 02/28/22  9:29 AM   Specimen: Urine, Catheterized  Result Value Ref Range Status   Specimen Description   Final    URINE, CATHETERIZED Performed at [facility] [address], [place], [place] [address]    Special Requests   Final    NONE Performed at [facility], [place] [address] [place], [place] [address]    Culture   Final    NO GROWTH Performed at [facility], [place] [address], [place], [place] [address]    Report Status 03/01/2022 FINAL  Final  Culture, blood (Routine  X 2) w Reflex to ID Panel     Status: None (Preliminary result)   Collection Time: 02/28/22  4:14 PM   Specimen: BLOOD RIGHT HAND  Result Value Ref Range Status   Specimen Description   Final    BLOOD RIGHT HAND Performed at [facility], [place] [address] [place], [place] [address]    Special Requests   Final    AEROBIC BOTTLE ONLY Blood Culture results may not be optimal due to an inadequate volume of blood received in culture bottles Performed at [facility] [address], [place], [place] [address]    Culture   Final    NO GROWTH 3 DAYS Performed at [facility], [place] [address], [place], [place] [address]    Report Status PENDING  Incomplete  Culture, blood (Routine X 2) w Reflex to ID Panel     Status: None (Preliminary result)   Collection Time: 02/28/22  5:05 PM   Specimen: BLOOD RIGHT ARM  Result Value Ref Range Status   Specimen Description   Final    BLOOD RIGHT ARM Performed at [facility], [address] [place], [place] [address]    Special Requests   Final    BOTTLES DRAWN AEROBIC ONLY Blood Culture results may not be optimal due to an inadequate volume of blood received in culture bottles Performed at [facility] [address], [place], [place] [address]    Culture   Final    NO GROWTH 3 DAYS Performed at [facility], [place] [address] [place], [place] [address]    Report Status PENDING  Incomplete     Radiology Studies: No results found.  Scheduled Meds:  atorvastatin  40 mg Oral Daily   carvedilol  12.5 mg Oral BID WC   dexamethasone  2 mg Oral TID   escitalopram  10 mg Oral Daily   ferrous sulfate  325 mg Oral BID WC   heparin injection (subcutaneous)  5,000 Units Subcutaneous Q8H   insulin aspart  0-15 Units Subcutaneous TID WC   insulin aspart  0-5 Units Subcutaneous QHS   insulin aspart  4 Units Subcutaneous TID WC   insulin glargine-yfgn  25 Units Subcutaneous Daily    levothyroxine  150 mcg Oral QAC breakfast   mouth rinse  15 mL Mouth Rinse 4 times per day   pantoprazole  40 mg Oral BID   polyethylene glycol  17 g Oral Daily   senna-docusate  1 tablet Oral BID   sodium bicarbonate  650 mg Oral BID   tamsulosin  0.4 mg Oral Daily   Continuous Infusions:  sodium chloride 75 mL/hr at 03/03/22 0808     LOS: 4 days   Shelly Coss, MD [facility] P1/25/2024, 10:19 AM

## 2022-03-03 NOTE — Progress Notes (Addendum)
CROSS COVER NOTE  NAME: Dreshon Proffit MRN: [id number] DOB : [birth year]    Date of Service   03/03/2022   Patient is a 77 y.o M with hx CKD stage 3, OSA, COPD, GERD, brain lesion, and afib on xarelto .  He presented to the ED on 02/27/22 with c/o generalized weakness and hematuria.   Per family, Xarelto was initially held for a brain biopsy procedure.  Last dose was given on 1/15. Patient restarted on Xarelto for atrial fibrillation today, 1/25.   HPI/Events of Note   Bedside RN reports rectal bleeding.  However, patient states that this is not his first episode while hospitalized?  No previous documentation of rectal bleeding found.  No history of hemorrhoids.  Per RN, patient states he had a "hard" bowel movement.  Stools described as brownish-red content with bright red blood on tissue paper.  At this time patient denies dizziness or lightheadedness.  VSS.   Interventions/ Plan   CBC --> hemoglobin 10.3--> 11.0 Monitor for further bleeding episodes.       Raenette Rover, DNP, [place]

## 2022-03-03 NOTE — Progress Notes (Addendum)
[place] for xarelto Indication: hx atrial fibrillation  No Known Allergies  Patient Measurements: Height: '5\' 9"'$  (175.3 cm) Weight: 119.6 kg (263 lb 10.7 oz) IBW/kg (Calculated) : 70.7 Heparin Dosing Weight:   Vital Signs: Temp: 98.4 F (36.9 C) (01/25 0805) Temp Source: Oral (01/25 0805) BP: 138/78 (01/25 0805) Pulse Rate: 99 (01/25 0805)  Labs: Recent Labs    02/28/22 1455 03/01/22 0621 03/02/22 0613 03/03/22 0559  HGB 11.5* 11.7* 10.3*  --   HCT 34.4* 34.7* 30.8*  --   PLT 147* 153 135*  --   CREATININE 2.63* 2.36* 2.08* 1.80*    Estimated Creatinine Clearance: 45.3 mL/min (A) (by C-G formula based on SCr of 1.8 mg/dL (H)).   Medications:  - on xarelto 15 mg daily PTA   Assessment: Patient is a 77 y.o M with hx CKD stage 3, brain mass (was scheduled to have a brain biopsy on 03/01/22) and afib on xarelto PTA, who presented to the ED on 02/27/22 with c/o generalized weakness and hematuria. Pt's daughter in law Herschel [place]) reported that they were instructed to hold his xarelto for a week prior to the brain biopsy procedure and that the last dose he took was on 02/21/22. Hematuria resolved. Pharmacy has been consulted on 03/03/22 to resume xarelto back for patient.  - Of note, pt's daughter in law reported that she called his neurosurgeon's office on 03/03/22 to clarify the plan for biopsy. They informed her that xarelto can be resumed back at this time and that they will re-schedule the biopsy for another date.    Plan:  - d/c hep SQ - resume xarelto home dose of 15 mg daily - pharmacy will sign off. Re-consult [place] if need further assistance.  Jasiel Apachito P 03/03/2022,1:55 PM

## 2022-03-04 DIAGNOSIS — I9589 Other hypotension: Secondary | ICD-10-CM | POA: Diagnosis not present

## 2022-03-04 DIAGNOSIS — E861 Hypovolemia: Secondary | ICD-10-CM | POA: Diagnosis not present

## 2022-03-04 LAB — BASIC METABOLIC PANEL
Anion gap: 6 (ref 5–15)
BUN: 36 mg/dL — ABNORMAL HIGH (ref 8–23)
CO2: 22 mmol/L (ref 22–32)
Calcium: 7.9 mg/dL — ABNORMAL LOW (ref 8.9–10.3)
Chloride: 110 mmol/L (ref 98–111)
Creatinine, Ser: 1.82 mg/dL — ABNORMAL HIGH (ref 0.61–1.24)
GFR, Estimated: 38 mL/min — ABNORMAL LOW (ref >60)
Glucose, Bld: 209 mg/dL — ABNORMAL HIGH (ref 70–99)
Potassium: 5.1 mmol/L (ref 3.5–5.1)
Sodium: 138 mmol/L (ref 135–145)

## 2022-03-04 LAB — HEMOGLOBIN AND HEMATOCRIT, BLOOD
HCT: 34.6 % — ABNORMAL LOW (ref 39.0–52.0)
Hemoglobin: 11.5 g/dL — ABNORMAL LOW (ref 13.0–17.0)

## 2022-03-04 LAB — GLUCOSE, CAPILLARY
Glucose-Capillary: 206 mg/dL — ABNORMAL HIGH (ref 70–99)
Glucose-Capillary: 258 mg/dL — ABNORMAL HIGH (ref 70–99)

## 2022-03-04 MED ORDER — POLYETHYLENE GLYCOL 3350 17 G PO PACK: 17.0000 g | PACK | Freq: Every day | ORAL | 0 refills | Status: DC

## 2022-03-04 MED ORDER — INSULIN ASPART 100 UNIT/ML IJ SOLN: 4.0000 [IU] | Freq: Three times a day (TID) | INTRAMUSCULAR | 11 refills | Status: DC

## 2022-03-04 MED ORDER — SENNOSIDES-DOCUSATE SODIUM 8.6-50 MG PO TABS: 1.0000 | ORAL_TABLET | Freq: Two times a day (BID) | ORAL | Status: DC

## 2022-03-04 MED ORDER — SODIUM BICARBONATE 650 MG PO TABS: 650.0000 mg | ORAL_TABLET | Freq: Two times a day (BID) | ORAL | Status: DC

## 2022-03-04 NOTE — Progress Notes (Signed)
Report called to Madlu, Therapist, sports at [facility]. All questions answered.

## 2022-03-04 NOTE — TOC Transition Note (Addendum)
Transition of Care [facility]) - CM/SW Discharge Note   Patient Details  Name: Patrick Rocha MRN: [id number] Date of Birth: Jan 07, 1946  Transition of Care [facility]) CM/SW Contact:  Angelita Ingles, RN Phone [phone]  03/04/2022, 11:51 AM   Clinical Narrative:    Insurance Josem Kaufmann has been approved 1/25-1/29 Plan auth ID [id number] Candace Cruise ID [id number]. [facility] at [facility] has been made aware. D/c summary has been faxed. MD/RN updated.   1226 CM received call from Star at [facility]. Patient is good to go to [facility].  Transportation arranged per [employer]. Daughter in law [place] made aware. RN updated.. d/c packet is at nurses station.  Please call report to  [place] Room # 101         Patient Goals and CMS Choice      Discharge Placement                         Discharge Plan and Services Additional resources added to the After Visit Summary for                                       Social Determinants of Health (SDOH) Interventions SDOH Screenings   Food Insecurity: No Food Insecurity (03/01/2022)  Housing: Low Risk  (03/01/2022)  Transportation Needs: No Transportation Needs (03/01/2022)  Utilities: Not At Risk (03/01/2022)  Tobacco Use: Medium Risk (03/01/2022)     Readmission Risk Interventions     No data to display

## 2022-03-04 NOTE — Progress Notes (Signed)
Physical Therapy Treatment Patient Details Name: Patrick Rocha MRN: [id number] DOB: 1945/03/29 Today's Date: 03/04/2022   History of Present Illness Patient is a 77 year old male who presented with weakness, mental status changes, hematuria. pt has brain biopsy pending for brain lesion, PMH: hypotension, acute renal failure, and dehydration. PMH: morbid obesity, a fib,  HTN, SOA, COPD, GERD.    PT Comments    Pt reports he is slowly feeling stronger and tolerating more activity. Pt completed LE bed exercises with supervision-min assist. Pt ambulated in the hall with RW 75 ft min guard assist with frequent standing rest breaks. O2 sats 94% with gait and HR 80-118 with activity. Pt's MD came in and reported he is d/c to SNF today for rehab. Pt will continue to benefit from skilled PT in the SNF setting to improve activity tolerance and independence.   Recommendations for follow up therapy are one component of a multi-disciplinary discharge planning process, led by the attending physician.  Recommendations may be updated based on patient status, additional functional criteria and insurance authorization.  Follow Up Recommendations  Skilled nursing-short term rehab (<3 hours/day) Can patient physically be transported by private vehicle: Yes   Assistance Recommended at Discharge    Patient can return home with the following A little help with walking and/or transfers;Assistance with cooking/housework;Assist for transportation;Help with stairs or ramp for entrance   Equipment Recommendations  None recommended by PT    Recommendations for Other Services       Precautions / Restrictions Precautions Precautions: Fall Restrictions Weight Bearing Restrictions: No     Mobility  Bed Mobility Overal bed mobility: Needs Assistance Bed Mobility: Supine to Sit     Supine to sit: Min guard     General bed mobility comments: verbal cues for technique for increased independence     Transfers Overall transfer level: Needs assistance Equipment used: Rolling walker (2 wheels) Transfers: Sit to/from Stand Sit to Stand: Min guard                Ambulation/Gait Ambulation/Gait assistance: Min guard Gait Distance (Feet): 75 Feet Assistive device: Rolling walker (2 wheels) Gait Pattern/deviations: Step-through pattern, Decreased stride length, Wide base of support Gait velocity: decreased     General Gait Details: multiple standing rest breaks   Stairs             Wheelchair Mobility    Modified Rankin (Stroke Patients Only)       Balance Overall balance assessment: Mild deficits observed, not formally tested   Sitting balance-Leahy Scale: Good     Standing balance support: Bilateral upper extremity supported Standing balance-Leahy Scale: Fair Standing balance comment: reliant on device for amb                            Cognition Arousal/Alertness: Awake/alert Behavior During Therapy: WFL for tasks assessed/performed Overall Cognitive Status: Within Functional Limits for tasks assessed                                          Exercises Total Joint Exercises Quad Sets: AROM, Strengthening, Both, 10 reps, Supine Heel Slides: AROM, Strengthening, Both, 10 reps, Supine Hip ABduction/ADduction: AROM, Strengthening, Both, 10 reps, Supine Straight Leg Raises: AROM, Strengthening, AAROM, Both, Supine    General Comments        Pertinent Vitals/Pain  Pain Assessment Pain Assessment: No/denies pain    Home Living                          Prior Function            PT Goals (current goals can now be found in the care plan section) Progress towards PT goals: Progressing toward goals    Frequency    Min 2X/week      PT Plan Current plan remains appropriate    Co-evaluation              AM-PAC PT "6 Clicks" Mobility   Outcome Measure  Help needed turning from your back to  your side while in a flat bed without using bedrails?: A Little Help needed moving from lying on your back to sitting on the side of a flat bed without using bedrails?: A Little Help needed moving to and from a bed to a chair (including a wheelchair)?: A Little Help needed standing up from a chair using your arms (e.g., wheelchair or bedside chair)?: A Little Help needed to walk in hospital room?: A Little Help needed climbing 3-5 steps with a railing? : A Little 6 Click Score: 18    End of Session Equipment Utilized During Treatment: Gait belt Activity Tolerance: Patient limited by fatigue Patient left: with nursing/sitter in room Nurse Communication: Mobility status PT Visit Diagnosis: Other abnormalities of gait and mobility (R26.89)     Time: 4287-6811 PT Time Calculation (min) (ACUTE ONLY): 24 min  Charges:  $Gait Training: 8-22 mins $Therapeutic Exercise: 8-22 mins                      Lelon Mast 03/04/2022, 9:27 AM

## 2022-03-04 NOTE — Discharge Summary (Addendum)
Physician Discharge Summary  Patrick Rocha ZOX:[id number] DOB: 1945/06/19 DOA: 02/27/2022  PCP: Elliot Dally, MD  Admit date: 02/27/2022 Discharge date: 03/04/2022  Admitted From: Home Disposition:  SNF  Discharge Condition:Stable CODE STATUS:FULL Diet recommendation: Heart Healthy  Brief/Interim Summary: Patient is a 77 year old male with history of morbid obesity, A-fib on Xarelto currently on hold in anticipation of brain biopsy, hypertension, OSA on CPAP, CKD stage IIIb with baseline creatinine of 1.7-1.9, type 2 diabetes, COPD, GERD who presented to the emergency Department with weakness, confusion, hematuria.  On presentation he was hypertensive with systolic blood pressure in the range of 60s, hypoxic.  Lab work showed AKI.  Started on IV fluids. Significant improvement in the kidney function now.  PT/OT recommending SNF.  Medically stable for discharge    Following problems were addressed during the hospitalization:  AKI on CKD stage IIIb: Baseline creatinine of 1.7-1.9.  Hypotensive on presentation.  Creatinine was in the range of 3 in presentation.  Kidney function improving with IV fluids.  AKI likely associated with poor oral intake, low blood pressure.Now at baseline  Recent history of UTI: Urine culture has not shown any growth.  S/P  3 days of antibiotics with ceftriaxone   Acute metabolic encephalopathy: Presented with confusion, generalized weakness.  Was recently diagnosed with and was on ciprofloxacin.  Chest x-ray did not show pneumonia.  Ciprofloxacin discontinued.  Currently he is alert and orinted   Diabetes 2: Recent A1c of 6.3.  Continue current insulin regimen while on steroids.  Monitor blood sugars.Will discontinue metformin   Hypomagnesemia/hypophosphatemia:  supplemented and corrected   Hypothyroidism: On Synthyroid   GERD:On  PPI   History of paroxysmal A-fib: Status post ablation 2004.  On Coreg for rate control.  Was on Xarelto but  currently on hold due to anticipation of brain biopsy that was planned for 03/01/22,now restarted   Hypertension: Continue current medications  COPD: Currently not in exacerbation.     OSA: CPAP nightly   Brain lesion with edema: Patient noted to have planned biopsy scheduled for 03/01/2022 prior to admission.Continue Decadron/steroids.Informed neurosurgery, Dr. Reatha Armour of patient's admission who recommended planned biopsy will likely need to be rescheduled. CT head ordered per neurosurgery in anticipation of biopsy.Showed unchanged inferior right frontal and insular hypoattenuation, compatible with known mass.   Debility/deconditioning: Patient seen by PT and OT and recommended SNF on discharge     Discharge Diagnoses:  Principal Problem:   Hypotension Active Problems:   Dehydration   Acute renal failure (HCC)   Paroxysmal atrial fibrillation ([place])   Hypothyroidism   Brain lesion   Type 2 diabetes mellitus with chronic kidney disease, with long-term current use of insulin (HCC)   Acute metabolic encephalopathy   Urinary tract infection without hematuria    Discharge Instructions  Discharge Instructions     Diet Carb Modified   Complete by: As directed    Discharge instructions   Complete by: As directed    1)Please take prescribed medications as instructed 2)Do a CBC and  BMP test in a week 3)Follow up with neurosurgery as an outpatient 4)Follow up with nephrology as an outpatient   Increase activity slowly   Complete by: As directed       Allergies as of 03/04/2022   No Known Allergies      Medication List     STOP taking these medications    ciprofloxacin 250 MG tablet Commonly known as: CIPRO   metFORMIN 1000 MG tablet Commonly known as:  GLUCOPHAGE       TAKE these medications    atorvastatin 40 MG tablet Commonly known as: LIPITOR Take 40 mg by mouth daily.   carvedilol 12.5 MG tablet Commonly known as: COREG Take 12.5 mg by mouth 2 (two)  times daily with a meal.   Cinnamon 500 MG capsule Take 500 mg by mouth in the morning and at bedtime.   dexamethasone 2 MG tablet Commonly known as: DECADRON Take 1 tablet (2 mg total) by mouth 3 (three) times daily.   escitalopram 10 MG tablet Commonly known as: LEXAPRO Take 10 mg by mouth daily.   ferrous sulfate 325 (65 FE) MG tablet Take 325 mg by mouth 2 (two) times daily with a meal.   furosemide 20 MG tablet Commonly known as: LASIX Take 20 mg by mouth daily as needed for edema.   insulin aspart 100 UNIT/ML injection Commonly known as: novoLOG Inject 4 Units into the skin 3 (three) times daily with meals.   ketoconazole 2 % cream Commonly known as: NIZORAL Apply 1 Application topically daily. What changed:  when to take this reasons to take this   Lantus SoloStar 100 UNIT/ML Solostar Pen Generic drug: insulin glargine Inject 25 Units into the skin daily.   levothyroxine 150 MCG tablet Commonly known as: SYNTHROID Take 150 mcg by mouth daily before breakfast.   meclizine 25 MG tablet Commonly known as: ANTIVERT Take 1 tablet (25 mg total) by mouth 3 (three) times daily as needed for dizziness (vertigo).   NON FORMULARY Pt uses a cpap nightly   pantoprazole 40 MG tablet Commonly known as: PROTONIX Take 40 mg by mouth 2 (two) times daily.   pioglitazone 15 MG tablet Commonly known as: ACTOS Take 15 mg by mouth daily.   polyethylene glycol 17 g packet Commonly known as: MIRALAX / GLYCOLAX Take 17 g by mouth daily. Start taking on: March 05, 2022   Rivaroxaban 15 MG Tabs tablet Commonly known as: XARELTO Take 15 mg by mouth daily with supper.   senna-docusate 8.6-50 MG tablet Commonly known as: Senokot-S Take 1 tablet by mouth 2 (two) times daily.   sodium bicarbonate 650 MG tablet Take 1 tablet (650 mg total) by mouth 2 (two) times daily.   tamsulosin 0.4 MG Caps capsule Commonly known as: FLOMAX Take 0.4 mg by mouth daily.         Follow-up Information     Dalton, Kathlene November, MD .   Specialty: Internal Medicine               No Known Allergies  Consultations: None   Procedures/Studies: CT DBS HEAD W/O CONTRAST (1MM)  Result Date: 02/28/2022 CLINICAL DATA:  Brain lesion.  Preop evaluation.  Weakness. EXAM: CT HEAD WITHOUT CONTRAST TECHNIQUE: Contiguous axial images were obtained from the base of the skull through the vertex without intravenous contrast. RADIATION DOSE REDUCTION: This exam was performed according to the departmental dose-optimization program which includes automated exposure control, adjustment of the mA and/or kV according to patient size and/or use of iterative reconstruction technique. COMPARISON:  Head CT 02/27/2022.  MRI brain 01/29/2022. FINDINGS: Brain: Unchanged region of predominantly subcortical hypoattenuation in the inferior right frontal lobe extending into the right insula. No acute intracranial hemorrhage. Cortical gray-white differentiation is otherwise preserved. No hydrocephalus. No extra-axial collection. Basilar cisterns are patent. Vascular: No hyperdense vessel or unexpected calcification. Skull: Normal. 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 Mastoids are well aerated. Orbits are unremarkable. Other: None. IMPRESSION:  1. No acute intracranial hemorrhage. 2. Unchanged inferior right frontal and insular hypoattenuation, compatible with known mass. Electronically Signed   By: Emmit Alexanders M.D.   On: 02/28/2022 11:55   CT Renal Stone Study  Result Date: 02/27/2022 CLINICAL DATA:  Mental status change, hypotension, weakness, acute kidney injury, question renal ischemia or infarction EXAM: CT ABDOMEN AND PELVIS WITHOUT CONTRAST TECHNIQUE: Multidetector CT imaging of the abdomen and pelvis was performed following the standard protocol without IV contrast. RADIATION DOSE REDUCTION: This exam was performed according to the departmental dose-optimization  program which includes automated exposure control, adjustment of the mA and/or kV according to patient size and/or use of iterative reconstruction technique. COMPARISON:  01/29/2022 FINDINGS: Lower chest: Lung bases clear.  Trace pericardial effusion. Hepatobiliary: Gallbladder and liver normal appearance Pancreas: Normal appearance Spleen: Normal appearance Adrenals/Urinary Tract: Adrenal glands normal appearance. Partially exophytic masslike area at mid LEFT kidney with central fat attenuation, by history post cryoablation of LEFT renal mass, site stable. Small simple peripelvic cyst LEFT kidney 14 mm diameter; no follow-up imaging recommended. Additional RIGHT renal cysts largest 3.5 cm greatest size; no follow-up imaging recommended. Cortical thinning of both kidneys. No perinephric edema. No urinary tract calcification, hydronephrosis or hydroureter. Bladder decompressed. Stomach/Bowel: Short segment of normal appendix identified. Stomach and bowel loops normal appearance Vascular/Lymphatic: Atherosclerotic calcifications aorta and iliac arteries as well as coronary arteries. Aorta normal caliber. No adenopathy. Reproductive: Normal appearing prostate gland and seminal vesicles Other: Diffuse mesenteric and retroperitoneal lipomatosis. No free air or free fluid. Small RIGHT inguinal hernia containing fat. Musculoskeletal: Osseous demineralization. IMPRESSION: Trace pericardial effusion. Small RIGHT inguinal hernia containing fat. 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 No acute intra-abdominal or intrapelvic abnormalities. Scattered atherosclerotic calcifications including coronary arteries. Aortic Atherosclerosis (ICD10-I70.0). Electronically Signed   By: Lavonia Dana M.D.   On: 02/27/2022 18:01   CT HEAD WO CONTRAST (5MM)  Result Date: 02/27/2022 CLINICAL DATA:  Mental status change EXAM: CT HEAD WITHOUT CONTRAST TECHNIQUE: Contiguous axial images were obtained from the base of the  skull through the vertex without intravenous contrast. RADIATION DOSE REDUCTION: This exam was performed according to the departmental dose-optimization program which includes automated exposure control, adjustment of the mA and/or kV according to patient size and/or use of iterative reconstruction technique. COMPARISON:  CT head 01/28/2022.  MRI head 01/29/2022. FINDINGS: Brain: Again seen is vasogenic edema in the inferior right frontal lobe and right insular region similar to the prior study. There is mild mass effect on the frontal horn of the right lateral ventricle, unchanged. There is no acute intracranial hemorrhage or acute extra-axial fluid collection. There is no midline shift. There is no hydrocephalus. No acute cortical infarcts are seen. There is stable mild periventricular white matter hypodensity, likely chronic small vessel ischemic change. Vascular: Atherosclerotic calcifications are present within the cavernous internal carotid arteries. Skull: Normal. Negative for fracture or focal lesion. Sinuses/Orbits: There is air-fluid level and mucosal thickening of the left maxillary sinus. Orbits are within normal limits. Other: None. IMPRESSION: 1. Stable vasogenic edema in the inferior right frontal lobe and right insular region with mild mass effect on the frontal horn of the right lateral ventricle. No midline shift. 2. No acute intracranial hemorrhage. 3. Left maxillary sinus disease. Correlate for acute sinusitis. Electronically Signed   By: Ronney Asters M.D.   On: 02/27/2022 17:53   DG Chest Port 1 View  Result Date: 02/27/2022 CLINICAL DATA:  Weakness EXAM: PORTABLE CHEST 1 VIEW COMPARISON:  Chest x-ray 01/28/2022 FINDINGS: The heart size and mediastinal contours are within normal limits. Both lungs are clear. The visualized skeletal structures are unremarkable. IMPRESSION: No active disease. Electronically Signed   By: Ronney Asters M.D.   On: 02/27/2022 17:30      Subjective: Patient  seen and examined at bedside today.  Hemodynamically stable for discharge to SnF.  No new complaints  Discharge Exam: Vitals:   03/04/22 0538 03/04/22 0922  BP: (!) 154/90   Pulse: 96 (!) 118  Resp: 18   Temp: (!) 97.5 F (36.4 C)   SpO2: 98% 94%   Vitals:   03/03/22 1403 03/03/22 2059 03/04/22 0538 03/04/22 0922  BP: (!) 146/79 133/78 (!) 154/90   Pulse: 97 98 96 (!) 118  Resp: '18 18 18   '$ Temp: 98.3 F (36.8 C) 97.7 F (36.5 C) (!) 97.5 F (36.4 C)   TempSrc: Oral Oral Oral   SpO2: 97% 99% 98% 94%  Weight:      Height:        General: Pt is alert, awake, not in acute distress Cardiovascular: RRR, S1/S2 +, no rubs, no gallops Respiratory: CTA bilaterally, no wheezing, no rhonchi Abdominal: Soft, NT, ND, bowel sounds + Extremities: no edema, no cyanosis    The results of significant diagnostics from this hospitalization (including imaging, microbiology, ancillary and laboratory) are listed below for reference.     Microbiology: Recent Results (from the past 240 hour(s))  Resp panel by RT-PCR (RSV, Flu A&B, Covid) Anterior Nasal Swab     Status: None   Collection Time: 02/27/22  6:14 PM   Specimen: Anterior Nasal Swab  Result Value Ref Range Status   SARS Coronavirus 2 by RT PCR NEGATIVE NEGATIVE Final    Comment: (NOTE) SARS-CoV-2 target nucleic acids are NOT DETECTED.  The SARS-CoV-2 RNA is generally detectable in upper respiratory specimens during the acute phase of infection. The lowest concentration of SARS-CoV-2 viral copies this assay can detect is 138 copies/mL. A negative result does not preclude SARS-Cov-2 infection and should not be used as the sole basis for treatment or other patient management decisions. A negative result may occur with  improper specimen collection/handling, submission of specimen other than nasopharyngeal swab, presence of viral mutation(s) within the areas targeted by this assay, and inadequate number of viral copies(<138  copies/mL). A negative result must be combined with clinical observations, patient history, and epidemiological information. The expected result is Negative.  Fact Sheet for Patients:  EntrepreneurPulse.com.au  Fact Sheet for Healthcare Providers:  IncredibleEmployment.be  This test is no t yet approved or cleared by the [place] FDA and  has been authorized for detection and/or diagnosis of SARS-CoV-2 by FDA under an Emergency Use Authorization (EUA). This EUA will remain  in effect (meaning this test can be used) for the duration of the COVID-19 declaration under Section 564(b)(1) of the Act, 21 U.S.C.section 360bbb-3(b)(1), unless the authorization is terminated  or revoked sooner.       Influenza A by PCR NEGATIVE NEGATIVE Final   Influenza B by PCR NEGATIVE NEGATIVE Final    Comment: (NOTE) The Xpert Xpress SARS-CoV-2/FLU/RSV plus assay is intended as an aid in the diagnosis of influenza from Nasopharyngeal swab specimens and should not be used as a sole basis for treatment. Nasal washings and aspirates are unacceptable for Xpert Xpress SARS-CoV-2/FLU/RSV testing.  Fact Sheet for Patients: EntrepreneurPulse.com.au  Fact Sheet for Healthcare Providers: IncredibleEmployment.be  This test is not yet approved or cleared by  the [employer] and has been authorized for detection and/or diagnosis of SARS-CoV-2 by FDA under an Emergency Use Authorization (EUA). This EUA will remain in effect (meaning this test can be used) for the duration of the COVID-19 declaration under Section 564(b)(1) of the Act, 21 U.S.C. section 360bbb-3(b)(1), unless the authorization is terminated or revoked.     Resp Syncytial Virus by PCR NEGATIVE NEGATIVE Final    Comment: (NOTE) Fact Sheet for Patients: EntrepreneurPulse.com.au  Fact Sheet for Healthcare  Providers: IncredibleEmployment.be  This test is not yet approved or cleared by the [place] FDA and has been authorized for detection and/or diagnosis of SARS-CoV-2 by FDA under an Emergency Use Authorization (EUA). This EUA will remain in effect (meaning this test can be used) for the duration of the COVID-19 declaration under Section 564(b)(1) of the Act, 21 U.S.C. section 360bbb-3(b)(1), unless the authorization is terminated or revoked.  Performed at [facility], [place] [address], [place], [place] [address]   Urine Culture     Status: None   Collection Time: 02/28/22  9:29 AM   Specimen: Urine, Catheterized  Result Value Ref Range Status   Specimen Description   Final    URINE, CATHETERIZED Performed at [place] [address] [place], [place] [address]    Special Requests   Final    NONE Performed at [facility], [place] [address] [place], [place] [address]    Culture   Final    NO GROWTH Performed at [facility], [place] [address] [place], [place] [address]    Report Status 03/01/2022 FINAL  Final  Culture, blood (Routine X 2) w Reflex to ID Panel     Status: None (Preliminary result)   Collection Time: 02/28/22  4:14 PM   Specimen: BLOOD RIGHT HAND  Result Value Ref Range Status   Specimen Description   Final    BLOOD RIGHT HAND Performed at [facility], [place] [address] [place], [place] [address]    Special Requests   Final    AEROBIC BOTTLE ONLY Blood Culture results may not be optimal due to an inadequate volume of blood received in culture bottles Performed at [facility] [address] [place], [place] [address]    Culture   Final    NO GROWTH 3 DAYS Performed at [facility], [place] [address], [place], [place] [address]    Report Status PENDING  Incomplete  Culture, blood (Routine X 2) w Reflex to ID Panel     Status: None (Preliminary  result)   Collection Time: 02/28/22  5:05 PM   Specimen: BLOOD RIGHT ARM  Result Value Ref Range Status   Specimen Description   Final    BLOOD RIGHT ARM Performed at [facility], [place] [address] [place], [place] [address]    Special Requests   Final    BOTTLES DRAWN AEROBIC ONLY Blood Culture results may not be optimal due to an inadequate volume of blood received in culture bottles Performed at [place] [address], [place], [place] [address]    Culture   Final    NO GROWTH 3 DAYS Performed at [facility], [place] [address], [place],  [address]    Report Status PENDING  Incomplete     Labs: BNP (last 3 results) Recent Labs    01/28/22 2145  BNP 878.6*   Basic Metabolic Panel: Recent  Labs  Lab 02/28/22 1455 03/01/22 0621 03/02/22 0613 03/03/22 0559 03/04/22 0545  NA 134* 137 137 138 138  K 5.1 4.5 4.6 4.4 5.1  CL 109 111 113* 113* 110  CO2 17* 18* 20* 19* 22  GLUCOSE 283* 123* 181* 132* 209*  BUN 56* 44* 35* 32* 36*  CREATININE 2.63* 2.36* 2.08* 1.80* 1.82*  CALCIUM 7.3* 7.5* 7.4* 7.2* 7.9*  MG 1.4* 1.7 1.6* 1.8  --   PHOS  --   --  2.3* 3.4  --    Liver Function Tests: Recent Labs  Lab 02/27/22 1706 02/28/22 1455 03/01/22 0621 03/02/22 0613  AST '26 18 16  '$ --   ALT 34 28 27  --   ALKPHOS 56 52 45  --   BILITOT 1.1 0.4 0.6  --   PROT 5.9* 5.2* 5.2*  --   ALBUMIN 2.5* 2.2* 1.9* 1.9*   No results for input(s): "LIPASE", "AMYLASE" in the last 168 hours. No results for input(s): "AMMONIA" in the last 168 hours. CBC: Recent Labs  Lab 02/27/22 1706 02/27/22 1712 02/28/22 1455 03/01/22 0621 03/02/22 0613 03/03/22 2149 03/04/22 0827  WBC 8.4  --  5.9 6.6 6.8 5.5  --   NEUTROABS 6.2  --   --  5.8  --   --   --   HGB 13.5   < > 11.5* 11.7* 10.3* 11.0* 11.5*  HCT 40.0   < > 34.4* 34.7* 30.8* 33.4* 34.6*  MCV 92.4  --  93.5 92.3 94.5 97.1  --   PLT 207  --  147* 153 135* 137*  --    < > = values in this  interval not displayed.   Cardiac Enzymes: No results for input(s): "CKTOTAL", "CKMB", "CKMBINDEX", "TROPONINI" in the last 168 hours. BNP: Invalid input(s): "POCBNP" CBG: Recent Labs  Lab 03/03/22 0722 03/03/22 1150 03/03/22 1730 03/03/22 2115 03/04/22 0729  GLUCAP 131* 249* 193* 288* 206*   D-Dimer No results for input(s): "DDIMER" in the last 72 hours. Hgb A1c No results for input(s): "HGBA1C" in the last 72 hours. Lipid Profile No results for input(s): "CHOL", "HDL", "LDLCALC", "TRIG", "CHOLHDL", "LDLDIRECT" in the last 72 hours. Thyroid function studies No results for input(s): "TSH", "T4TOTAL", "T3FREE", "THYROIDAB" in the last 72 hours.  Invalid input(s): "FREET3" Anemia work up No results for input(s): "VITAMINB12", "FOLATE", "FERRITIN", "TIBC", "IRON", "RETICCTPCT" in the last 72 hours. Urinalysis    Component Value Date/Time   COLORURINE YELLOW 02/28/2022 0923   APPEARANCEUR HAZY (A) 02/28/2022 0923   LABSPEC 1.018 02/28/2022 0923   PHURINE 5.0 02/28/2022 0923   GLUCOSEU 50 (A) 02/28/2022 0923   HGBUR NEGATIVE 02/28/2022 0923   BILIRUBINUR NEGATIVE 02/28/2022 0923   KETONESUR NEGATIVE 02/28/2022 0923   PROTEINUR 30 (A) 02/28/2022 0923   NITRITE NEGATIVE 02/28/2022 0923   LEUKOCYTESUR SMALL (A) 02/28/2022 0923   Sepsis Labs Recent Labs  Lab 02/28/22 1455 03/01/22 0621 03/02/22 0613 03/03/22 2149  WBC 5.9 6.6 6.8 5.5   Microbiology Recent Results (from the past 240 hour(s))  Resp panel by RT-PCR (RSV, Flu A&B, Covid) Anterior Nasal Swab     Status: None   Collection Time: 02/27/22  6:14 PM   Specimen: Anterior Nasal Swab  Result Value Ref Range Status   SARS Coronavirus 2 by RT PCR NEGATIVE NEGATIVE Final    Comment: (NOTE) SARS-CoV-2 target nucleic acids are NOT DETECTED.  The SARS-CoV-2 RNA is generally detectable in upper respiratory specimens during the acute phase of infection.  The lowest concentration of SARS-CoV-2 viral copies this assay  can detect is 138 copies/mL. A negative result does not preclude SARS-Cov-2 infection and should not be used as the sole basis for treatment or other patient management decisions. A negative result may occur with  improper specimen collection/handling, submission of specimen other than nasopharyngeal swab, presence of viral mutation(s) within the areas targeted by this assay, and inadequate number of viral copies(<138 copies/mL). A negative result must be combined with clinical observations, patient history, and epidemiological information. The expected result is Negative.  Fact Sheet for Patients:  EntrepreneurPulse.com.au  Fact Sheet for Healthcare Providers:  IncredibleEmployment.be  This test is no t yet approved or cleared by the [place] FDA and  has been authorized for detection and/or diagnosis of SARS-CoV-2 by FDA under an Emergency Use Authorization (EUA). This EUA will remain  in effect (meaning this test can be used) for the duration of the COVID-19 declaration under Section 564(b)(1) of the Act, 21 U.S.C.section 360bbb-3(b)(1), unless the authorization is terminated  or revoked sooner.       Influenza A by PCR NEGATIVE NEGATIVE Final   Influenza B by PCR NEGATIVE NEGATIVE Final    Comment: (NOTE) The Xpert Xpress SARS-CoV-2/FLU/RSV plus assay is intended as an aid in the diagnosis of influenza from Nasopharyngeal swab specimens and should not be used as a sole basis for treatment. Nasal washings and aspirates are unacceptable for Xpert Xpress SARS-CoV-2/FLU/RSV testing.  Fact Sheet for Patients: EntrepreneurPulse.com.au  Fact Sheet for Healthcare Providers: IncredibleEmployment.be  This test is not yet approved or cleared by the [place] FDA and has been authorized for detection and/or diagnosis of SARS-CoV-2 by FDA under an Emergency Use Authorization (EUA). This EUA will  remain in effect (meaning this test can be used) for the duration of the COVID-19 declaration under Section 564(b)(1) of the Act, 21 U.S.C. section 360bbb-3(b)(1), unless the authorization is terminated or revoked.     Resp Syncytial Virus by PCR NEGATIVE NEGATIVE Final    Comment: (NOTE) Fact Sheet for Patients: EntrepreneurPulse.com.au  Fact Sheet for Healthcare Providers: IncredibleEmployment.be  This test is not yet approved or cleared by the [place] FDA and has been authorized for detection and/or diagnosis of SARS-CoV-2 by FDA under an Emergency Use Authorization (EUA). This EUA will remain in effect (meaning this test can be used) for the duration of the COVID-19 declaration under Section 564(b)(1) of the Act, 21 U.S.C. section 360bbb-3(b)(1), unless the authorization is terminated or revoked.  Performed at [facility], [place] [address], [place], [place] [address]   Urine Culture     Status: None   Collection Time: 02/28/22  9:29 AM   Specimen: Urine, Catheterized  Result Value Ref Range Status   Specimen Description   Final    URINE, CATHETERIZED Performed at [facility] [address], [place], [place] [address]    Special Requests   Final    NONE Performed at [facility], [place] [address], [place], [place] [address]    Culture   Final    NO GROWTH Performed at [facility], [place] [address], [place], [place] [address]    Report Status 03/01/2022 FINAL  Final  Culture, blood (Routine X 2) w Reflex to ID Panel     Status: None (Preliminary result)   Collection Time: 02/28/22  4:14 PM   Specimen: BLOOD RIGHT HAND  Result Value Ref Range Status   Specimen Description  Final    BLOOD RIGHT HAND Performed at [facility], [place] [address], [place], [place] [address]    Special Requests   Final    AEROBIC BOTTLE ONLY Blood Culture results may not be optimal  due to an inadequate volume of blood received in culture bottles Performed at [place] [address], [address] [place], [place] [address]    Culture   Final    NO GROWTH 3 DAYS Performed at [facility], [place] [address] [place], [place] [address]    Report Status PENDING  Incomplete  Culture, blood (Routine X 2) w Reflex to ID Panel     Status: None (Preliminary result)   Collection Time: 02/28/22  5:05 PM   Specimen: BLOOD RIGHT ARM  Result Value Ref Range Status   Specimen Description   Final    BLOOD RIGHT ARM Performed at [facility], [place] [address], [place], [place] [address]    Special Requests   Final    BOTTLES DRAWN AEROBIC ONLY Blood Culture results may not be optimal due to an inadequate volume of blood received in culture bottles Performed at [place] [address], [place], [place] [address]    Culture   Final    NO GROWTH 3 DAYS Performed at [facility], [place] [address] [place], [place] [address]    Report Status PENDING  Incomplete    Please note: You were cared for by a hospitalist during your hospital stay. Once you are discharged, your primary care physician will handle any further medical issues. Please note that NO REFILLS for any discharge medications will be authorized once you are discharged, as it is imperative that you return to your primary care physician (or establish a relationship with a primary care physician if you do not have one) for your post hospital discharge needs so that they can reassess your need for medications and monitor your lab values.    Time coordinating discharge: 40 minutes  SIGNED:   Shelly Coss, MD  [facility] 03/04/2022, 10:07 AM Pager [phone]  If 7PM-7AM, please contact night-coverage [email or web address] Password [email or web address]

## 2022-03-05 LAB — CULTURE, BLOOD (ROUTINE X 2)
Culture: NO GROWTH
Culture: NO GROWTH

## 2022-03-07 ENCOUNTER — Inpatient Hospital Stay: Payer: Medicare Other | Attending: Neurological Surgery

## 2022-03-28 ENCOUNTER — Encounter (HOSPITAL_COMMUNITY): Payer: Self-pay | Admitting: Neurological Surgery

## 2022-03-28 NOTE — Progress Notes (Signed)
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  Recent admission 1/21 through 03/04/2022 for AKI on CKD stage IIIb, hypotension, confusion.  Symptoms improved with IV fluids.  Kidney function returned to baseline; patient's baseline creatinine is ~1.8.  BMP and CBC from 03/04/2022 reviewed, creatinine elevated at 1.82 (consistent with CKD 3), mild anemia with hemoglobin 11.5, mild thrombocytopenia with platelets 137k, otherwise unremarkable.  Patient will need day of surgery evaluation.  EKG 01/28/2022: Sinus rhythm.  Rate 63.  PVC.  Right bundle branch block.  Inferior infarct, old.    Wynonia Musty [facility] Short Stay Center/Anesthesiology Phone [phone] 03/28/2022 3:39 PM

## 2022-03-28 NOTE — Anesthesia Preprocedure Evaluation (Signed)
Anesthesia Evaluation  Patient identified by MRN, date of birth, ID band Patient awake    Reviewed: Allergy & Precautions, H&P , NPO status , Patient's Chart, lab work & pertinent test results, reviewed documented beta blocker date and time   Airway Mallampati: IV  TM Distance: >3 FB Neck ROM: Full    Dental no notable dental hx. (+) Teeth Intact, Dental Advisory Given   Pulmonary sleep apnea and Continuous Positive Airway Pressure Ventilation , COPD, former smoker   Pulmonary exam normal breath sounds clear to auscultation       Cardiovascular hypertension, Pt. on medications and Pt. on home beta blockers  Rhythm:Regular Rate:Normal     Neuro/Psych    Depression    negative neurological ROS     GI/Hepatic Neg liver ROS,GERD  Medicated,,  Endo/Other  diabetes, Insulin Dependent, Oral Hypoglycemic AgentsHypothyroidism  Morbid obesity  Renal/GU Renal InsufficiencyRenal disease  negative genitourinary   Musculoskeletal   Abdominal   Peds  Hematology negative hematology ROS (+)   Anesthesia Other Findings   Reproductive/Obstetrics negative OB ROS                             Anesthesia Physical Anesthesia Plan  ASA: 3  Anesthesia Plan: General   Post-op Pain Management: Ofirmev IV (intra-op)*   Induction: Intravenous  PONV Risk Score and Plan: 3 and Ondansetron, Dexamethasone and Treatment may vary due to age or medical condition  Airway Management Planned: Oral ETT  Additional Equipment: Arterial line  Intra-op Plan:   Post-operative Plan: Extubation in OR  Informed Consent: I have reviewed the patients History and Physical, chart, labs and discussed the procedure including the risks, benefits and alternatives for the proposed anesthesia with the patient or authorized representative who has indicated his/her understanding and acceptance.     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  Recent admission 1/21 through 03/04/2022 for AKI on CKD stage IIIb, hypotension, confusion.  Symptoms improved with IV fluids.  Kidney function returned to baseline; patient's baseline creatinine is ~1.8.  BMP and CBC from 03/04/2022 reviewed, creatinine elevated at 1.82 (consistent with CKD 3), mild anemia with hemoglobin 11.5, mild thrombocytopenia with platelets 137k, otherwise unremarkable.  Patient will need day of surgery evaluation.  EKG 01/28/2022: Sinus rhythm.  Rate 63.  PVC.  Right bundle branch block.  Inferior infarct, old.  )        Anesthesia Quick Evaluation

## 2022-03-28 NOTE — Progress Notes (Signed)
PCP - Dr Toney Reil Cardiologist - n/a  Chest x-ray - 02/27/22 EKG - 01/28/22 Stress Test - Maybe 30 years ago per pt, normal per pt  ECHO - 04/10/14 Cardiac Cath - n/a  ICD Pacemaker/Loop - n/a  Sleep Study -  Yes CPAP - uses CPAP  Diabetes Type 2 Do not take Actos, Glipizide or Metformin on the morning of surgery. (SDW Call)  THE NIGHT BEFORE SURGERY, take 1/2 of your Lantus insulin if needed.  .     THE MORNING OF SURGERY, take 1/2 of your Lantus insulin if needed.   If your blood sugar is less than 70 mg/dL, you will need to treat for low blood sugar: Treat a low blood sugar (less than 70 mg/dL) with  cup of clear juice (cranberry or apple), 4 glucose tablets, OR glucose gel. Recheck blood sugar in 15 minutes after treatment (to make sure it is greater than 70 mg/dL). If your blood sugar is not greater than 70 mg/dL on recheck, call [phone] for further instructions.  Blood Thinner Instructions:  Last dose of Xarelto was on Approx 03/07/22 per Daughter Herschel [place].  NPO   Anesthesia review: Yes  STOP now taking any Aspirin (unless otherwise instructed by your surgeon), Aleve, Naproxen, Ibuprofen, Motrin, Advil, Goody's, BC's, all herbal medications, fish oil, and all vitamins.   Coronavirus Screening Does the patient have any of the following symptoms:  Cough yes/no: No Fever (>100.87F)  yes/no: No Runny nose yes/no: No Sore throat yes/no: No Difficulty breathing/shortness of breath  yes/no: No  Have you traveled in the last 14 days and where? yes/no: No  Daughter Herschel [place] verbalized understanding of instructions that were given via phone.

## 2022-03-29 ENCOUNTER — Inpatient Hospital Stay (HOSPITAL_COMMUNITY): Payer: Medicare Other | Admitting: Physician Assistant

## 2022-03-29 ENCOUNTER — Encounter (HOSPITAL_COMMUNITY): Payer: Self-pay | Admitting: Neurological Surgery

## 2022-03-29 ENCOUNTER — Encounter (HOSPITAL_COMMUNITY)
Admission: RE | Disposition: A | Discharge status: Home / Self Care | Payer: Self-pay | Source: Home / Self Care | Attending: Neurological Surgery

## 2022-03-29 ENCOUNTER — Inpatient Hospital Stay (HOSPITAL_COMMUNITY)
Admission: RE | Admit: 2022-03-29 | Discharge: 2022-03-30 | DRG: 027 | Disposition: A | Discharge status: Home / Self Care | Payer: Medicare Other | Attending: Neurological Surgery | Admitting: Neurological Surgery

## 2022-03-29 ENCOUNTER — Other Ambulatory Visit: Payer: Self-pay

## 2022-03-29 ENCOUNTER — Inpatient Hospital Stay (HOSPITAL_BASED_OUTPATIENT_CLINIC_OR_DEPARTMENT_OTHER): Payer: Medicare Other | Admitting: Physician Assistant

## 2022-03-29 ENCOUNTER — Observation Stay (HOSPITAL_COMMUNITY): Payer: Medicare Other

## 2022-03-29 DIAGNOSIS — I4891 Unspecified atrial fibrillation: Secondary | ICD-10-CM | POA: Diagnosis present

## 2022-03-29 DIAGNOSIS — J449 Chronic obstructive pulmonary disease, unspecified: Secondary | ICD-10-CM | POA: Diagnosis present

## 2022-03-29 DIAGNOSIS — G9389 Other specified disorders of brain: Secondary | ICD-10-CM

## 2022-03-29 DIAGNOSIS — Z79890 Hormone replacement therapy (postmenopausal): Secondary | ICD-10-CM

## 2022-03-29 DIAGNOSIS — I10 Essential (primary) hypertension: Secondary | ICD-10-CM

## 2022-03-29 DIAGNOSIS — Z7984 Long term (current) use of oral hypoglycemic drugs: Secondary | ICD-10-CM

## 2022-03-29 DIAGNOSIS — Z79899 Other long term (current) drug therapy: Secondary | ICD-10-CM

## 2022-03-29 DIAGNOSIS — G939 Disorder of brain, unspecified: Principal | ICD-10-CM | POA: Diagnosis present

## 2022-03-29 DIAGNOSIS — G4733 Obstructive sleep apnea (adult) (pediatric): Secondary | ICD-10-CM | POA: Diagnosis present

## 2022-03-29 DIAGNOSIS — E039 Hypothyroidism, unspecified: Secondary | ICD-10-CM

## 2022-03-29 DIAGNOSIS — Z87891 Personal history of nicotine dependence: Secondary | ICD-10-CM

## 2022-03-29 DIAGNOSIS — E119 Type 2 diabetes mellitus without complications: Secondary | ICD-10-CM

## 2022-03-29 DIAGNOSIS — K219 Gastro-esophageal reflux disease without esophagitis: Secondary | ICD-10-CM | POA: Diagnosis present

## 2022-03-29 DIAGNOSIS — E1122 Type 2 diabetes mellitus with diabetic chronic kidney disease: Secondary | ICD-10-CM | POA: Diagnosis present

## 2022-03-29 DIAGNOSIS — Z794 Long term (current) use of insulin: Secondary | ICD-10-CM

## 2022-03-29 DIAGNOSIS — N183 Chronic kidney disease, stage 3 unspecified: Secondary | ICD-10-CM | POA: Diagnosis present

## 2022-03-29 DIAGNOSIS — I129 Hypertensive chronic kidney disease with stage 1 through stage 4 chronic kidney disease, or unspecified chronic kidney disease: Secondary | ICD-10-CM | POA: Diagnosis present

## 2022-03-29 DIAGNOSIS — F32A Depression, unspecified: Secondary | ICD-10-CM | POA: Diagnosis present

## 2022-03-29 DIAGNOSIS — Z85528 Personal history of other malignant neoplasm of kidney: Secondary | ICD-10-CM

## 2022-03-29 HISTORY — DX: Chronic kidney disease, unspecified: N18.9

## 2022-03-29 HISTORY — DX: Gastro-esophageal reflux disease without esophagitis: K21.9

## 2022-03-29 HISTORY — DX: Allergy status to unspecified drugs, medicaments and biological substances: Z88.9

## 2022-03-29 HISTORY — DX: Sleep apnea, unspecified: G47.30

## 2022-03-29 HISTORY — PX: PR DURAL GRAFT SPINAL: 63710

## 2022-03-29 HISTORY — DX: Hypothyroidism, unspecified: E03.9

## 2022-03-29 LAB — TYPE AND SCREEN
ABO/RH(D): A POS
Antibody Screen: NEGATIVE

## 2022-03-29 LAB — GLUCOSE, CAPILLARY
Glucose-Capillary: 118 mg/dL — ABNORMAL HIGH (ref 70–99)
Glucose-Capillary: 107 mg/dL — ABNORMAL HIGH (ref 70–99)
Glucose-Capillary: 103 mg/dL — ABNORMAL HIGH (ref 70–99)

## 2022-03-29 LAB — SURGICAL PCR SCREEN
MRSA, PCR: NEGATIVE
Staphylococcus aureus: NEGATIVE

## 2022-03-29 SURGERY — FRAMELESS STEREOTACTIC BIOPSY
Anesthesia: General | Laterality: Right

## 2022-03-29 MED ORDER — ALBUTEROL SULFATE HFA 108 (90 BASE) MCG/ACT IN AERS
INHALATION_SPRAY | RESPIRATORY_TRACT | Status: DC | PRN
  Administered 2022-03-29 (×2): 8 via RESPIRATORY_TRACT

## 2022-03-29 MED ORDER — INSULIN ASPART 100 UNIT/ML IJ SOLN
0.0000 [IU] | Freq: Three times a day (TID) | INTRAMUSCULAR | Status: DC
  Administered 2022-03-30 (×2): 8 [IU] via SUBCUTANEOUS

## 2022-03-29 MED ORDER — PANTOPRAZOLE SODIUM 40 MG PO TBEC
40.0000 mg | DELAYED_RELEASE_TABLET | Freq: Two times a day (BID) | ORAL | Status: DC
  Administered 2022-03-29 – 2022-03-30 (×2): 40 mg via ORAL
  Filled 2022-03-29 (×2): qty 1

## 2022-03-29 MED ORDER — ESCITALOPRAM OXALATE 10 MG PO TABS
10.0000 mg | ORAL_TABLET | Freq: Every day | ORAL | Status: DC
  Administered 2022-03-30: 10 mg via ORAL
  Filled 2022-03-29: qty 1

## 2022-03-29 MED ORDER — ACETAMINOPHEN 650 MG RE SUPP: 650.0000 mg | RECTAL | Status: DC | PRN

## 2022-03-29 MED ORDER — ACETAMINOPHEN 500 MG PO TABS
1000.0000 mg | ORAL_TABLET | Freq: Once | ORAL | Status: AC
  Administered 2022-03-29: 1000 mg via ORAL

## 2022-03-29 MED ORDER — LIDOCAINE 2% (20 MG/ML) 5 ML SYRINGE
INTRAMUSCULAR | Status: DC | PRN
  Administered 2022-03-29: 60 mg via INTRAVENOUS

## 2022-03-29 MED ORDER — METFORMIN HCL 500 MG PO TABS
1000.0000 mg | ORAL_TABLET | Freq: Two times a day (BID) | ORAL | Status: DC
  Administered 2022-03-29 – 2022-03-30 (×2): 1000 mg via ORAL
  Filled 2022-03-29 (×2): qty 2

## 2022-03-29 MED ORDER — PROPOFOL 10 MG/ML IV BOLUS
INTRAVENOUS | Status: DC | PRN
  Administered 2022-03-29: 100 ug/kg/min via INTRAVENOUS

## 2022-03-29 MED ORDER — LIDOCAINE-EPINEPHRINE 1 %-1:100000 IJ SOLN
INTRAMUSCULAR | Status: DC | PRN
  Administered 2022-03-29: 5 mL

## 2022-03-29 MED ORDER — SODIUM CHLORIDE 0.9 % IV SOLN: INTRAVENOUS | Status: DC

## 2022-03-29 MED ORDER — LACTATED RINGERS IV SOLN: INTRAVENOUS | Status: DC

## 2022-03-29 MED ORDER — FENTANYL CITRATE (PF) 250 MCG/5ML IJ SOLN
INTRAMUSCULAR | Status: AC
  Filled 2022-03-29: qty 5

## 2022-03-29 MED ORDER — 0.9 % SODIUM CHLORIDE (POUR BTL) OPTIME
TOPICAL | Status: DC | PRN
  Administered 2022-03-29: 2000 mL

## 2022-03-29 MED ORDER — ONDANSETRON HCL 4 MG/2ML IJ SOLN: 4.0000 mg | INTRAMUSCULAR | Status: DC | PRN

## 2022-03-29 MED ORDER — LABETALOL HCL 5 MG/ML IV SOLN: 10.0000 mg | INTRAVENOUS | Status: DC | PRN

## 2022-03-29 MED ORDER — THROMBIN 5000 UNITS EX SOLR
OROMUCOSAL | Status: DC | PRN
  Administered 2022-03-29: 5 mL via TOPICAL

## 2022-03-29 MED ORDER — GLIPIZIDE 10 MG PO TABS
10.0000 mg | ORAL_TABLET | Freq: Every day | ORAL | Status: DC
  Administered 2022-03-30: 10 mg via ORAL
  Filled 2022-03-29: qty 1

## 2022-03-29 MED ORDER — DILTIAZEM HCL ER COATED BEADS 240 MG PO CP24
240.0000 mg | ORAL_CAPSULE | Freq: Every day | ORAL | Status: DC
  Administered 2022-03-30: 240 mg via ORAL
  Filled 2022-03-29 (×2): qty 1

## 2022-03-29 MED ORDER — DEXAMETHASONE SODIUM PHOSPHATE 10 MG/ML IJ SOLN
INTRAMUSCULAR | Status: AC
  Filled 2022-03-29: qty 1

## 2022-03-29 MED ORDER — EPHEDRINE SULFATE-NACL 50-0.9 MG/10ML-% IV SOSY
PREFILLED_SYRINGE | INTRAVENOUS | Status: DC | PRN
  Administered 2022-03-29: 5 mg via INTRAVENOUS

## 2022-03-29 MED ORDER — MECLIZINE HCL 25 MG PO TABS: 25.0000 mg | ORAL_TABLET | Freq: Three times a day (TID) | ORAL | Status: DC | PRN

## 2022-03-29 MED ORDER — CHLORHEXIDINE GLUCONATE 0.12 % MT SOLN
15.0000 mL | Freq: Once | OROMUCOSAL | Status: AC
  Administered 2022-03-29: 15 mL via OROMUCOSAL
  Filled 2022-03-29: qty 15

## 2022-03-29 MED ORDER — ACETAMINOPHEN 500 MG PO TABS
ORAL_TABLET | ORAL | Status: AC
  Filled 2022-03-29: qty 2

## 2022-03-29 MED ORDER — LIDOCAINE-EPINEPHRINE 1 %-1:100000 IJ SOLN
INTRAMUSCULAR | Status: AC
  Filled 2022-03-29: qty 1

## 2022-03-29 MED ORDER — FENTANYL CITRATE (PF) 100 MCG/2ML IJ SOLN: 25.0000 ug | INTRAMUSCULAR | Status: DC | PRN

## 2022-03-29 MED ORDER — PROPOFOL 1000 MG/100ML IV EMUL
INTRAVENOUS | Status: AC
  Filled 2022-03-29: qty 100

## 2022-03-29 MED ORDER — FUROSEMIDE 20 MG PO TABS: 20.0000 mg | ORAL_TABLET | Freq: Every day | ORAL | Status: DC | PRN

## 2022-03-29 MED ORDER — INSULIN ASPART 100 UNIT/ML IJ SOLN: 0.0000 [IU] | INTRAMUSCULAR | Status: DC | PRN

## 2022-03-29 MED ORDER — TAMSULOSIN HCL 0.4 MG PO CAPS
0.4000 mg | ORAL_CAPSULE | Freq: Every day | ORAL | Status: DC
  Administered 2022-03-29 – 2022-03-30 (×2): 0.4 mg via ORAL
  Filled 2022-03-29 (×2): qty 1

## 2022-03-29 MED ORDER — CEFAZOLIN SODIUM-DEXTROSE 2-4 GM/100ML-% IV SOLN
INTRAVENOUS | Status: AC
  Filled 2022-03-29: qty 100

## 2022-03-29 MED ORDER — ONDANSETRON HCL 4 MG PO TABS: 4.0000 mg | ORAL_TABLET | ORAL | Status: DC | PRN

## 2022-03-29 MED ORDER — LEVOTHYROXINE SODIUM 75 MCG PO TABS
150.0000 ug | ORAL_TABLET | Freq: Every day | ORAL | Status: DC
  Administered 2022-03-30: 150 ug via ORAL
  Filled 2022-03-29: qty 2

## 2022-03-29 MED ORDER — ORAL CARE MOUTH RINSE: 15.0000 mL | Freq: Once | OROMUCOSAL | Status: AC

## 2022-03-29 MED ORDER — ROCURONIUM BROMIDE 10 MG/ML (PF) SYRINGE
PREFILLED_SYRINGE | INTRAVENOUS | Status: DC | PRN
  Administered 2022-03-29: 20 mg via INTRAVENOUS
  Administered 2022-03-29: 60 mg via INTRAVENOUS

## 2022-03-29 MED ORDER — HYDROCODONE-ACETAMINOPHEN 5-325 MG PO TABS: 1.0000 | ORAL_TABLET | ORAL | Status: DC | PRN

## 2022-03-29 MED ORDER — FENTANYL CITRATE (PF) 250 MCG/5ML IJ SOLN
INTRAMUSCULAR | Status: DC | PRN
  Administered 2022-03-29 (×2): 50 ug via INTRAVENOUS

## 2022-03-29 MED ORDER — THROMBIN 5000 UNITS EX SOLR
CUTANEOUS | Status: AC
  Filled 2022-03-29: qty 10000

## 2022-03-29 MED ORDER — ACETAMINOPHEN 325 MG PO TABS
650.0000 mg | ORAL_TABLET | ORAL | Status: DC | PRN
  Administered 2022-03-29: 650 mg via ORAL
  Filled 2022-03-29: qty 2

## 2022-03-29 MED ORDER — ONDANSETRON HCL 4 MG/2ML IJ SOLN
INTRAMUSCULAR | Status: AC
  Filled 2022-03-29: qty 2

## 2022-03-29 MED ORDER — PIOGLITAZONE HCL 15 MG PO TABS
15.0000 mg | ORAL_TABLET | Freq: Every day | ORAL | Status: DC
  Administered 2022-03-30: 15 mg via ORAL
  Filled 2022-03-29: qty 1

## 2022-03-29 MED ORDER — PANTOPRAZOLE SODIUM 40 MG IV SOLR: 40.0000 mg | Freq: Every day | INTRAVENOUS | Status: DC

## 2022-03-29 MED ORDER — PROPOFOL 10 MG/ML IV BOLUS
INTRAVENOUS | Status: AC
  Filled 2022-03-29: qty 20

## 2022-03-29 MED ORDER — SUGAMMADEX SODIUM 200 MG/2ML IV SOLN
INTRAVENOUS | Status: DC | PRN
  Administered 2022-03-29: 200 mg via INTRAVENOUS

## 2022-03-29 MED ORDER — DEXAMETHASONE SODIUM PHOSPHATE 10 MG/ML IJ SOLN
INTRAMUSCULAR | Status: DC | PRN
  Administered 2022-03-29: 5 mg via INTRAVENOUS

## 2022-03-29 MED ORDER — PHENYLEPHRINE 80 MCG/ML (10ML) SYRINGE FOR IV PUSH (FOR BLOOD PRESSURE SUPPORT)
PREFILLED_SYRINGE | INTRAVENOUS | Status: DC | PRN
  Administered 2022-03-29: 160 ug via INTRAVENOUS
  Administered 2022-03-29: 240 ug via INTRAVENOUS
  Administered 2022-03-29: 160 ug via INTRAVENOUS
  Administered 2022-03-29 (×2): 200 ug via INTRAVENOUS
  Administered 2022-03-29: 300 ug via INTRAVENOUS
  Administered 2022-03-29: 160 ug via INTRAVENOUS

## 2022-03-29 MED ORDER — BENAZEPRIL HCL 20 MG PO TABS
40.0000 mg | ORAL_TABLET | Freq: Every day | ORAL | Status: DC
  Administered 2022-03-29 – 2022-03-30 (×2): 40 mg via ORAL
  Filled 2022-03-29 (×2): qty 2

## 2022-03-29 MED ORDER — CEFAZOLIN SODIUM-DEXTROSE 2-4 GM/100ML-% IV SOLN
2.0000 g | Freq: Three times a day (TID) | INTRAVENOUS | Status: AC
  Administered 2022-03-29 – 2022-03-30 (×2): 2 g via INTRAVENOUS
  Filled 2022-03-29 (×2): qty 100

## 2022-03-29 MED ORDER — THROMBIN 5000 UNITS EX SOLR
CUTANEOUS | Status: AC
  Filled 2022-03-29: qty 5000

## 2022-03-29 MED ORDER — ATORVASTATIN CALCIUM 40 MG PO TABS
40.0000 mg | ORAL_TABLET | Freq: Every day | ORAL | Status: DC
  Administered 2022-03-30: 40 mg via ORAL
  Filled 2022-03-29: qty 1

## 2022-03-29 MED ORDER — DEXTROSE 5 % IV SOLN
INTRAVENOUS | Status: DC | PRN
  Administered 2022-03-29: 3 g via INTRAVENOUS

## 2022-03-29 MED ORDER — DOCUSATE SODIUM 100 MG PO CAPS
100.0000 mg | ORAL_CAPSULE | Freq: Two times a day (BID) | ORAL | Status: DC
  Administered 2022-03-29 – 2022-03-30 (×2): 100 mg via ORAL
  Filled 2022-03-29 (×2): qty 1

## 2022-03-29 MED ORDER — CARVEDILOL 12.5 MG PO TABS
12.5000 mg | ORAL_TABLET | Freq: Two times a day (BID) | ORAL | Status: DC
  Administered 2022-03-29 – 2022-03-30 (×2): 12.5 mg via ORAL
  Filled 2022-03-29 (×2): qty 1

## 2022-03-29 MED ORDER — DEXAMETHASONE 4 MG PO TABS
2.0000 mg | ORAL_TABLET | Freq: Three times a day (TID) | ORAL | Status: DC
  Administered 2022-03-29 – 2022-03-30 (×3): 2 mg via ORAL
  Filled 2022-03-29 (×3): qty 1

## 2022-03-29 MED ORDER — PROMETHAZINE HCL 25 MG PO TABS: 12.5000 mg | ORAL_TABLET | ORAL | Status: DC | PRN

## 2022-03-29 MED ORDER — SODIUM CHLORIDE 0.9 % IV SOLN
0.1500 ug/kg/min | INTRAVENOUS | Status: DC
  Administered 2022-03-29: .1 ug/kg/min via INTRAVENOUS
  Filled 2022-03-29 (×6): qty 2000

## 2022-03-29 MED ORDER — LIDOCAINE 2% (20 MG/ML) 5 ML SYRINGE
INTRAMUSCULAR | Status: AC
  Filled 2022-03-29: qty 5

## 2022-03-29 MED ORDER — BACITRACIN ZINC 500 UNIT/GM EX OINT
TOPICAL_OINTMENT | CUTANEOUS | Status: AC
  Filled 2022-03-29: qty 28.35

## 2022-03-29 MED ORDER — MORPHINE SULFATE (PF) 2 MG/ML IV SOLN: 1.0000 mg | INTRAVENOUS | Status: DC | PRN

## 2022-03-29 MED ORDER — SODIUM CHLORIDE 0.9 % IV SOLN: INTRAVENOUS | Status: DC | PRN

## 2022-03-29 MED ORDER — PHENYLEPHRINE HCL-NACL 20-0.9 MG/250ML-% IV SOLN
INTRAVENOUS | Status: DC | PRN
  Administered 2022-03-29: 30 ug/min via INTRAVENOUS

## 2022-03-29 SURGICAL SUPPLY — 74 items
BAG COUNTER SPONGE SURGICOUNT (BAG) ×1 IMPLANT
BAND RUBBER #18 3X1/16 STRL (MISCELLANEOUS) ×2 IMPLANT
BENZOIN TINCTURE PRP APPL 2/3 (GAUZE/BANDAGES/DRESSINGS) IMPLANT
BIT DRILL 2.5MM SMALL QC EVOS (BIT) IMPLANT
BLADE CLIPPER SURG (BLADE) ×1 IMPLANT
BNDG GAUZE DERMACEA FLUFF 4 (GAUZE/BANDAGES/DRESSINGS) IMPLANT
BNDG STRETCH 4X75 STRL LF (GAUZE/BANDAGES/DRESSINGS) IMPLANT
BUR 14 MATCH 3 (BUR) IMPLANT
BUR ACORN 9.0 PRECISION (BURR) IMPLANT
BUR ROUND FLUTED 4 SOFT TCH (BURR) IMPLANT
BURR 14 MATCH 3 (BUR)
CANISTER SUCT 3000ML PPV (MISCELLANEOUS) ×2 IMPLANT
CNTNR URN SCR LID CUP LEK RST (MISCELLANEOUS) ×1 IMPLANT
CONT SPEC 4OZ STRL OR WHT (MISCELLANEOUS) ×1
COVER MAYO STAND STRL (DRAPES) IMPLANT
COVERAGE SUPPORT O-ARM STEALTH (MISCELLANEOUS) ×1 IMPLANT
DRAPE HALF SHEET 40X57 (DRAPES) ×1 IMPLANT
DRAPE SHEET LG 3/4 BI-LAMINATE (DRAPES) ×1 IMPLANT
DRAPE STERI IOBAN 125X83 (DRAPES) IMPLANT
DRAPE SURG 17X23 STRL (DRAPES) IMPLANT
DRAPE WARM FLUID 44X44 (DRAPES) ×1 IMPLANT
DRILL 2.5MM SMALL QC EVOS (BIT) ×1
DRSG ADAPTIC 3X8 NADH LF (GAUZE/BANDAGES/DRESSINGS) IMPLANT
DRSG OPSITE POSTOP 3X4 (GAUZE/BANDAGES/DRESSINGS) IMPLANT
DRSG TELFA 3X8 NADH STRL (GAUZE/BANDAGES/DRESSINGS) ×1 IMPLANT
DURAPREP 6ML APPLICATOR 50/CS (WOUND CARE) ×1 IMPLANT
ELECT COATED BLADE 2.86 ST (ELECTRODE) ×1 IMPLANT
ELECT REM PT RETURN 9FT ADLT (ELECTROSURGICAL) ×1
ELECTRODE REM PT RTRN 9FT ADLT (ELECTROSURGICAL) ×1 IMPLANT
FEE COVERAGE SUPPORT O-ARM (MISCELLANEOUS) IMPLANT
GAUZE 4X4 16PLY ~~LOC~~+RFID DBL (SPONGE) IMPLANT
GAUZE SPONGE 4X4 12PLY STRL (GAUZE/BANDAGES/DRESSINGS) IMPLANT
GLOVE BIOGEL PI IND STRL 8 (GLOVE) ×1 IMPLANT
GLOVE EXAM NITRILE LRG STRL (GLOVE) IMPLANT
GLOVE EXAM NITRILE XS STR PU (GLOVE) IMPLANT
GLOVE SURG ENC MOIS LTX SZ8 (GLOVE) ×1 IMPLANT
GOWN STRL REUS W/ TWL LRG LVL3 (GOWN DISPOSABLE) ×2 IMPLANT
GOWN STRL REUS W/ TWL XL LVL3 (GOWN DISPOSABLE) ×1 IMPLANT
GOWN STRL REUS W/TWL 2XL LVL3 (GOWN DISPOSABLE) IMPLANT
GOWN STRL REUS W/TWL LRG LVL3 (GOWN DISPOSABLE) ×2
GOWN STRL REUS W/TWL XL LVL3 (GOWN DISPOSABLE) ×1
HEMOSTAT POWDER KIT SURGIFOAM (HEMOSTASIS) IMPLANT
HEMOSTAT SURGICEL 2X14 (HEMOSTASIS) IMPLANT
KIT BASIN OR (CUSTOM PROCEDURE TRAY) ×1 IMPLANT
KIT GUIDE INT PASS TRAJECTORY (MISCELLANEOUS) IMPLANT
KIT NDL BIOPSY PASSIVE (NEEDLE) IMPLANT
KIT NEEDLE BIOPSY PASSIVE (NEEDLE) ×1 IMPLANT
KIT TRAJECTORY GUIDE EXTERNAL (MISCELLANEOUS) IMPLANT
KIT TURNOVER KIT B (KITS) ×1 IMPLANT
MARKER SPHERE PSV REFLC NDI (MISCELLANEOUS) ×2 IMPLANT
NDL SPNL 18GX3.5 QUINCKE PK (NEEDLE) ×1 IMPLANT
NEEDLE HYPO 22GX1.5 SAFETY (NEEDLE) ×1 IMPLANT
NEEDLE SPNL 18GX3.5 QUINCKE PK (NEEDLE) ×1 IMPLANT
NS IRRIG 1000ML POUR BTL (IV SOLUTION) ×3 IMPLANT
PACK CRANIOTOMY CUSTOM (CUSTOM PROCEDURE TRAY) ×1 IMPLANT
PIN MAYFIELD SKULL DISP (PIN) ×1 IMPLANT
SPIKE FLUID TRANSFER (MISCELLANEOUS) ×1 IMPLANT
SPONGE SURGIFOAM ABS GEL SZ50 (HEMOSTASIS) ×1 IMPLANT
STAPLER VISISTAT 35W (STAPLE) ×1 IMPLANT
SUT ETHILON 3 0 FSL (SUTURE) IMPLANT
SUT ETHILON 3 0 PS 1 (SUTURE) IMPLANT
SUT MON AB 3-0 SH 27 (SUTURE) ×1
SUT MON AB 3-0 SH27 (SUTURE) IMPLANT
SUT NURALON 4 0 TR CR/8 (SUTURE) IMPLANT
SUT SILK 0 TIES 10X30 (SUTURE) IMPLANT
SUT VIC AB 2-0 CP2 18 (SUTURE) IMPLANT
SUT VICRYL RAPIDE 3/0 (SUTURE) IMPLANT
SYR 3ML LL SCALE MARK (SYRINGE) ×3 IMPLANT
TOWEL GREEN STERILE (TOWEL DISPOSABLE) ×1 IMPLANT
TOWEL GREEN STERILE FF (TOWEL DISPOSABLE) ×1 IMPLANT
TRAY ENT MC OR (CUSTOM PROCEDURE TRAY) ×1 IMPLANT
TRAY FOLEY MTR SLVR 16FR STAT (SET/KITS/TRAYS/PACK) ×1 IMPLANT
UNDERPAD 30X36 HEAVY ABSORB (UNDERPADS AND DIAPERS) ×1 IMPLANT
WATER STERILE IRR 1000ML POUR (IV SOLUTION) ×1 IMPLANT

## 2022-03-29 NOTE — Transfer of Care (Signed)
Immediate Anesthesia Transfer of Care Note  Patient: Patrick Rocha  Procedure(s) Performed: FRONTAL FRAMELESS STEREOTACTIC BIOPSY BRAIN WITH STEALTH (Right)  Patient Location: PACU  Anesthesia Type:General  Level of Consciousness: awake, alert , and oriented  Airway & Oxygen Therapy: Patient Spontanous Breathing and Patient connected to face mask oxygen  Post-op Assessment: Report given to RN and Post -op Vital signs reviewed and stable  Post vital signs: Reviewed and stable  Last Vitals:  Vitals Value Taken Time  BP 125/69 03/29/22 1438  Temp    Pulse 73 03/29/22 1442  Resp 14 03/29/22 1442  SpO2 100 % 03/29/22 1442  Vitals shown include unvalidated device data.  Last Pain:  Vitals:   03/29/22 1118  TempSrc:   PainSc: 7       Patients Stated Pain Goal: 4 ([id number])  Complications: No notable events documented.

## 2022-03-29 NOTE — Anesthesia Procedure Notes (Signed)
Arterial Line Insertion Start/End2/20/2024 11:40 AM, 03/29/2022 11:45 AM Performed by: Roderic Palau, MD, Minerva Ends, CRNA, CRNA  Patient location: Pre-op. Preanesthetic checklist: patient identified, IV checked, site marked, risks and benefits discussed, surgical consent, monitors and equipment checked, pre-op evaluation, timeout performed and anesthesia consent Lidocaine 1% used for infiltration Left, radial was placed Catheter size: 20 G Hand hygiene performed  and maximum sterile barriers used  Allen's test indicative of satisfactory collateral circulation Attempts: 1 Procedure performed without using ultrasound guided technique. Following insertion, dressing applied and Biopatch. Post procedure assessment: normal  Patient tolerated the procedure well with no immediate complications.

## 2022-03-29 NOTE — Anesthesia Procedure Notes (Signed)
Procedure Name: Intubation Date/Time: 03/29/2022 1:19 PM  Performed by: Minerva Ends, CRNAPre-anesthesia Checklist: Patient identified, Emergency Drugs available, Suction available and Patient being monitored Patient Re-evaluated:Patient Re-evaluated prior to induction Oxygen Delivery Method: Circle system utilized Preoxygenation: Pre-oxygenation with 100% oxygen Induction Type: IV induction Ventilation: Oral airway inserted - appropriate to patient size and Two handed mask ventilation required Laryngoscope Size: Glidescope and 4 Grade View: Grade I Tube type: Oral Tube size: 7.5 mm Number of attempts: 1 Airway Equipment and Method: Stylet and Oral airway Placement Confirmation: ETT inserted through vocal cords under direct vision, positive ETCO2 and breath sounds checked- equal and bilateral Secured at: 24 cm Tube secured with: Tape Dental Injury: Teeth and Oropharynx as per pre-operative assessment

## 2022-03-29 NOTE — H&P (Signed)
Providing Compassionate, Quality Care - Together  NEUROSURGERY HISTORY & PHYSICAL   Patrick Rocha is an 77 y.o. male.   Chief Complaint: concern for glioma HPI: This is a 77 year old male, right-handed, with a history of diabetes, sleep apnea, COPD, hypertension, renal insufficiency, that presented with headaches late last year.  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  Given this I recommended either close monitoring or stereotactic biopsy which she elected undergo stereotactic biopsy.  At this time he presents for surgical intervention today.  He denies any seizure activity, denies any blurred vision, numbness, tingling or weakness.  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 No pertinent family history. Social History:  reports that he quit smoking about 30 years ago. His smoking use included cigarettes. He has never used smokeless tobacco. He reports that he does not currently use alcohol. He reports that he does not  use drugs.  Allergies: No Known Allergies  Medications Prior to Admission  Medication Sig Dispense Refill   atorvastatin (LIPITOR) 40 MG tablet Take 40 mg by mouth daily.     benazepril (LOTENSIN) 40 MG tablet Take 40 mg by mouth daily.     carvedilol (COREG) 12.5 MG tablet Take 12.5 mg by mouth 2 (two) times daily with a meal.     Cinnamon 500 MG capsule Take 500 mg by mouth in the morning and at bedtime.     dexamethasone (DECADRON) 2 MG tablet Take 1 tablet (2 mg total) by mouth 3 (three) times daily. 90 tablet 0   diltiazem (DILACOR XR) 240 MG 24 hr capsule Take 240 mg by mouth daily.     escitalopram (LEXAPRO) 10 MG tablet Take 10 mg by mouth daily.     ferrous sulfate 325 (65 FE) MG tablet Take 325 mg by mouth 2 (two) times daily with a meal.     fexofenadine (ALLEGRA) 180 MG tablet Take 180 mg by mouth daily.     furosemide (LASIX) 20 MG tablet Take 20 mg by mouth daily as needed for edema.     glipiZIDE (GLUCOTROL) 10 MG tablet Take 10 mg by mouth daily.     ketoconazole (NIZORAL) 2 % cream Apply 1 Application topically daily. (Patient taking differently: Apply 1 Application topically daily as needed for irritation.) 60 g 2   LANTUS SOLOSTAR  100 UNIT/ML Solostar Pen Inject 40 Units into the skin daily as needed (high blood sugar).     levothyroxine (SYNTHROID, LEVOTHROID) 150 MCG tablet Take 150 mcg by mouth daily before breakfast.     meclizine (ANTIVERT) 25 MG tablet Take 1 tablet (25 mg total) by mouth 3 (three) times daily as needed for dizziness (vertigo). 30 tablet 0   metFORMIN (GLUCOPHAGE) 1000 MG tablet Take 1,000 mg by mouth 2 (two) times daily.     Omega-3 Fatty Acids (FISH OIL) 1000 MG CAPS Take 1,000 mg by mouth 3 (three) times daily.     pantoprazole (PROTONIX) 40 MG tablet Take 40 mg by mouth 2 (two) times daily.     pioglitazone (ACTOS) 15 MG tablet Take 15 mg by mouth daily.     sodium bicarbonate 650 MG tablet Take 1 tablet (650 mg total) by mouth 2 (two) times  daily. (Patient taking differently: Take 650 mg by mouth 2 (two) times daily as needed for heartburn.)     tamsulosin (FLOMAX) 0.4 MG CAPS capsule Take 0.4 mg by mouth daily.     insulin aspart (NOVOLOG) 100 UNIT/ML injection Inject 4 Units into the skin 3 (three) times daily with meals. (Patient not taking: Reported on 03/28/2022) 10 mL 11   NON FORMULARY Pt uses a cpap nightly     polyethylene glycol (MIRALAX / GLYCOLAX) 17 g packet Take 17 g by mouth daily. (Patient not taking: Reported on 03/28/2022) 14 each 0   Rivaroxaban (XARELTO) 15 MG TABS tablet Take 15 mg by mouth daily with supper.     senna-docusate (SENOKOT-S) 8.6-50 MG tablet Take 1 tablet by mouth 2 (two) times daily. (Patient not taking: Reported on 03/28/2022)      Results for orders placed or performed during the hospital encounter of 03/29/22 (from the past 48 hour(s))  Glucose, capillary     Status: Abnormal   Collection Time: 03/29/22 11:03 AM  Result Value Ref Range   Glucose-Capillary 118 (H) 70 - 99 mg/dL    Comment: Glucose reference range applies only to samples taken after fasting for at least 8 hours.  Type and screen [place]     Status: None   Collection Time: 03/29/22 11:10 AM  Result Value Ref Range   ABO/RH(D) A POS    Antibody Screen NEG    Sample Expiration      04/01/2022,2359 Performed at [facility], [place] [address], [place], [place] [address]    No results found.  ROS All pertinent positives and negatives are listed in HPI above  Blood pressure 133/71, pulse 93, temperature 98.1 F (36.7 C), temperature source Oral, resp. rate 18, height 5' 9"$  (1.753 m), weight 127 kg, SpO2 93 %. 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  We discussed all risks, benefits and expected outcomes as well as alternatives to treatment.  Informed consent was obtained and witnessed.  Answered all of his questions.  Thank you for allowing me to participate in this patient's care.  Please do not hesitate to call with questions or concerns.   Elwin Sleight, [facility] Cell: [phone]

## 2022-03-29 NOTE — Op Note (Signed)
   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 Pieter Partridge C. Sue Fernicola, DO  ASSISTANT: None  ANESTHESIA: General Endotracheal  EBL: Minimal  SPECIMENS: Right frontal biopsy  DRAINS: None  COMPLICATIONS: None  CONDITION: Hemodynamically stable  HISTORY: Patrick Rocha is a 77 y.o. male presented with headaches and upon imaging, MRI brain with and without contrast revealed a large FLAIR signal change and T2 signal change within the inferior right frontal lobe with a cystic somewhat ill-defined mass.  This was not contrast-enhancing whatsoever.  I discussed with the patient about close observation versus stereotactic biopsy and he elected to undergo stereotactic biopsy.  Unfortunately surgical resection was not an option given its extensive FLAIR changes throughout the frontal lobe with extension into the insula.  We discussed all risks, benefits and expected outcomes of stereotactic biopsy.  Answered all of his questions.  Informed consent was obtained and witnessed.  PROCEDURE IN DETAIL: The patient was brought to the operating room. After induction of general anesthesia, the patient was positioned on the operative table in the supine position, his head was placed in a [place] head holder. All pressure points were meticulously padded.  The neuronavigation system was registered using skin surface anatomy and verified with excellent accuracy using anatomic landmarks.  A trajectory was planned towards the cystic component of the lesion. the skin incision was then marked out and prepped and draped in the usual sterile fashion. Physician driven timeout was performed.  Local anesthetic was injected into the planned incision.  The  stereotactic guide was then placed along the appropriate trajectory.  Using a 15 blade, a small incision was created down to the periosteum.  Periosteum was cleared from the cranium.  Using a high-speed drill, the small trajectory craniectomy was created.  The biopsy needle was then measured to approximately 152 mm per the neuronavigation for our target.  This was verified again.  This was then placed to a depth of 152 mm without difficulty.  We then performed a series of biopsies circumferentially at 12:00, 9:00, 6:00, 3:00.  A yellowish-grayish tissue was obtained and sent for permanent pathology.  The stereotactic needle was gently withdrawn.  Hemostasis was achieved with Surgifoam.  The wound was then closed with 3-0 Vicryl suture and skin was closed with staples.  Sterile dressing was applied.  At the end of the case all sponge, needle, and instrument counts were correct. The patient was then transferred to the stretcher, extubated, and taken to the post-anesthesia care unit in stable hemodynamic condition.

## 2022-03-29 NOTE — Anesthesia Postprocedure Evaluation (Signed)
Anesthesia Post Note  Patient: Patrick Rocha  Procedure(s) Performed: FRONTAL FRAMELESS STEREOTACTIC BIOPSY BRAIN WITH STEALTH (Right)     Patient location during evaluation: PACU Anesthesia Type: General Level of consciousness: awake and alert Pain management: pain level controlled Vital Signs Assessment: post-procedure vital signs reviewed and stable Respiratory status: spontaneous breathing, nonlabored ventilation and respiratory function stable Cardiovascular status: blood pressure returned to baseline and stable Postop Assessment: no apparent nausea or vomiting Anesthetic complications: no  No notable events documented.  Last Vitals:  Vitals:   03/29/22 1511 03/29/22 1550  BP: 132/64   Pulse:    Resp:    Temp:  (!) 36.1 C  SpO2:      Last Pain:  Vitals:   03/29/22 1600  TempSrc:   PainSc: Asleep                 Elaine Middleton,W. [place]MOND

## 2022-03-30 ENCOUNTER — Encounter (HOSPITAL_COMMUNITY): Payer: Self-pay | Admitting: Neurological Surgery

## 2022-03-30 DIAGNOSIS — Z79890 Hormone replacement therapy (postmenopausal): Secondary | ICD-10-CM | POA: Diagnosis not present

## 2022-03-30 DIAGNOSIS — Z79899 Other long term (current) drug therapy: Secondary | ICD-10-CM | POA: Diagnosis not present

## 2022-03-30 DIAGNOSIS — I129 Hypertensive chronic kidney disease with stage 1 through stage 4 chronic kidney disease, or unspecified chronic kidney disease: Secondary | ICD-10-CM | POA: Diagnosis present

## 2022-03-30 DIAGNOSIS — E039 Hypothyroidism, unspecified: Secondary | ICD-10-CM | POA: Diagnosis present

## 2022-03-30 DIAGNOSIS — N183 Chronic kidney disease, stage 3 unspecified: Secondary | ICD-10-CM | POA: Diagnosis present

## 2022-03-30 DIAGNOSIS — Z87891 Personal history of nicotine dependence: Secondary | ICD-10-CM | POA: Diagnosis not present

## 2022-03-30 DIAGNOSIS — Z85528 Personal history of other malignant neoplasm of kidney: Secondary | ICD-10-CM | POA: Diagnosis not present

## 2022-03-30 DIAGNOSIS — I4891 Unspecified atrial fibrillation: Secondary | ICD-10-CM | POA: Diagnosis present

## 2022-03-30 DIAGNOSIS — G939 Disorder of brain, unspecified: Secondary | ICD-10-CM | POA: Diagnosis present

## 2022-03-30 DIAGNOSIS — F32A Depression, unspecified: Secondary | ICD-10-CM | POA: Diagnosis present

## 2022-03-30 DIAGNOSIS — G4733 Obstructive sleep apnea (adult) (pediatric): Secondary | ICD-10-CM | POA: Diagnosis present

## 2022-03-30 DIAGNOSIS — J449 Chronic obstructive pulmonary disease, unspecified: Secondary | ICD-10-CM | POA: Diagnosis present

## 2022-03-30 DIAGNOSIS — K219 Gastro-esophageal reflux disease without esophagitis: Secondary | ICD-10-CM | POA: Diagnosis present

## 2022-03-30 DIAGNOSIS — Z794 Long term (current) use of insulin: Secondary | ICD-10-CM | POA: Diagnosis not present

## 2022-03-30 DIAGNOSIS — E1122 Type 2 diabetes mellitus with diabetic chronic kidney disease: Secondary | ICD-10-CM | POA: Diagnosis present

## 2022-03-30 DIAGNOSIS — Z7984 Long term (current) use of oral hypoglycemic drugs: Secondary | ICD-10-CM | POA: Diagnosis not present

## 2022-03-30 LAB — GLUCOSE, CAPILLARY
Glucose-Capillary: 280 mg/dL — ABNORMAL HIGH (ref 70–99)
Glucose-Capillary: 284 mg/dL — ABNORMAL HIGH (ref 70–99)

## 2022-03-30 MED ORDER — DEXAMETHASONE 2 MG PO TABS: ORAL_TABLET | ORAL | 0 refills | Status: DC

## 2022-03-30 MED ORDER — RIVAROXABAN 15 MG PO TABS: 15.0000 mg | ORAL_TABLET | Freq: Every day | ORAL | 0 refills | Status: DC

## 2022-03-30 MED ORDER — HYDROCODONE-ACETAMINOPHEN 5-325 MG PO TABS: 1.0000 | ORAL_TABLET | Freq: Four times a day (QID) | ORAL | 0 refills | Status: DC | PRN

## 2022-03-30 MED ORDER — INSULIN ASPART 100 UNIT/ML IJ SOLN: 0.0000 [IU] | INTRAMUSCULAR | Status: DC

## 2022-03-30 NOTE — Plan of Care (Signed)
  Problem: Education: Goal: Ability to describe self-care measures that may prevent or decrease complications (Diabetes Survival Skills Education) will improve Outcome: Adequate for Discharge Goal: Individualized Educational Video(s) Outcome: Adequate for Discharge   Problem: Coping: Goal: Ability to adjust to condition or change in health will improve Outcome: Adequate for Discharge   Problem: Fluid Volume: Goal: Ability to maintain a balanced intake and output will improve Outcome: Adequate for Discharge   Problem: Health Behavior/Discharge Planning: Goal: Ability to identify and utilize available resources and services will improve Outcome: Adequate for Discharge Goal: Ability to manage health-related needs will improve Outcome: Adequate for Discharge   Problem: Metabolic: Goal: Ability to maintain appropriate glucose levels will improve Outcome: Adequate for Discharge   Problem: Nutritional: Goal: Maintenance of adequate nutrition will improve Outcome: Adequate for Discharge Goal: Progress toward achieving an optimal weight will improve Outcome: Adequate for Discharge   Problem: Skin Integrity: Goal: Risk for impaired skin integrity will decrease Outcome: Adequate for Discharge   Problem: Tissue Perfusion: Goal: Adequacy of tissue perfusion will improve Outcome: Adequate for Discharge   Problem: Education: Goal: Knowledge of the prescribed therapeutic regimen will improve Outcome: Adequate for Discharge   Problem: Clinical Measurements: Goal: Usual level of consciousness will be regained or maintained. Outcome: Adequate for Discharge Goal: Neurologic status will improve Outcome: Adequate for Discharge Goal: Ability to maintain intracranial pressure will improve Outcome: Adequate for Discharge   Problem: Skin Integrity: Goal: Demonstration of wound healing without infection will improve Outcome: Adequate for Discharge   Problem: Education: Goal: Knowledge of  General Education information will improve Description: Including pain rating scale, medication(s)/side effects and non-pharmacologic comfort measures Outcome: Adequate for Discharge   Problem: Health Behavior/Discharge Planning: Goal: Ability to manage health-related needs will improve Outcome: Adequate for Discharge   Problem: Clinical Measurements: Goal: Ability to maintain clinical measurements within normal limits will improve Outcome: Adequate for Discharge Goal: Will remain free from infection Outcome: Adequate for Discharge Goal: Diagnostic test results will improve Outcome: Adequate for Discharge Goal: Respiratory complications will improve Outcome: Adequate for Discharge Goal: Cardiovascular complication will be avoided Outcome: Adequate for Discharge   Problem: Activity: Goal: Risk for activity intolerance will decrease Outcome: Adequate for Discharge   Problem: Nutrition: Goal: Adequate nutrition will be maintained Outcome: Adequate for Discharge   Problem: Coping: Goal: Level of anxiety will decrease Outcome: Adequate for Discharge   Problem: Elimination: Goal: Will not experience complications related to bowel motility Outcome: Adequate for Discharge Goal: Will not experience complications related to urinary retention Outcome: Adequate for Discharge   Problem: Pain Managment: Goal: General experience of comfort will improve Outcome: Adequate for Discharge   Problem: Safety: Goal: Ability to remain free from injury will improve Outcome: Adequate for Discharge   Problem: Skin Integrity: Goal: Risk for impaired skin integrity will decrease Outcome: Adequate for Discharge

## 2022-03-30 NOTE — Discharge Summary (Signed)
Physician Discharge Summary  Patient ID: Patrick Rocha MRN: [id number] DOB/AGE: 10-01-1945 77 y.o.  Admit date: 03/29/2022 Discharge date: 03/30/2022  Admission Diagnoses:  Right frontal tumor  Discharge Diagnoses:  Same Principal Problem:   Brain lesion   Discharged Condition: Stable  Hospital Course:  Patrick Rocha is a 77 y.o. male  that underwent an elective stereotactic right frontal biopsy of lesion.  He tolerated the surgery well, postoperative CT revealed no acute complication.  He was monitored overnight in the progressive floor, remained at his neurologic baseline.  He was ambulating independently, his pain was controlled on oral medication.  He had an uncomplicated hospitalization course.  Treatments: Surgery -   Stereotactic right frontal biopsy  Discharge Exam: Blood pressure 119/68, pulse 98, temperature 97.7 F (36.5 C), temperature source Oral, resp. rate 14, height 5' 9"$  (1.753 m), weight 127 kg, SpO2 96 %. Awake, alert, oriented x3  PERRLA Speech fluent, appropriate CN grossly intact 5/5 BUE/BLE Wound c/d/i  Disposition: Discharge disposition: 01-Home or Self Care        Allergies as of 03/30/2022   No Known Allergies      Medication List     TAKE these medications    atorvastatin 40 MG tablet Commonly known as: LIPITOR Take 40 mg by mouth daily.   benazepril 40 MG tablet Commonly known as: LOTENSIN Take 40 mg by mouth daily.   carvedilol 12.5 MG tablet Commonly known as: COREG Take 12.5 mg by mouth 2 (two) times daily with a meal.   Cinnamon 500 MG capsule Take 500 mg by mouth in the morning and at bedtime.   dexamethasone 2 MG tablet Commonly known as: DECADRON Take 1 tablet (2 mg total) by mouth 2 (two) times daily for 4 days, THEN 1 tablet (2 mg total) daily for 4 days, THEN 1 tablet (2 mg total) every other day for 4 days. Start taking on: March 30, 2022 What changed: See the new instructions.    diltiazem 240 MG 24 hr capsule Commonly known as: DILACOR XR Take 240 mg by mouth daily.   escitalopram 10 MG tablet Commonly known as: LEXAPRO Take 10 mg by mouth daily.   ferrous sulfate 325 (65 FE) MG tablet Take 325 mg by mouth 2 (two) times daily with a meal.   fexofenadine 180 MG tablet Commonly known as: ALLEGRA Take 180 mg by mouth daily.   Fish Oil 1000 MG Caps Take 1,000 mg by mouth 3 (three) times daily.   furosemide 20 MG tablet Commonly known as: LASIX Take 20 mg by mouth daily as needed for edema.   glipiZIDE 10 MG tablet Commonly known as: GLUCOTROL Take 10 mg by mouth daily.   HYDROcodone-acetaminophen 5-325 MG tablet Commonly known as: NORCO/VICODIN Take 1 tablet by mouth every 6 (six) hours as needed for moderate pain.   insulin aspart 100 UNIT/ML injection Commonly known as: novoLOG Inject 4 Units into the skin 3 (three) times daily with meals.   ketoconazole 2 % cream Commonly known as: NIZORAL Apply 1 Application topically daily. What changed:  when to take this reasons to take this   Lantus SoloStar 100 UNIT/ML Solostar Pen Generic drug: insulin glargine Inject 40 Units into the skin daily as needed (high blood sugar).   levothyroxine 150 MCG tablet Commonly known as: SYNTHROID Take 150 mcg by mouth daily before breakfast.   meclizine 25 MG tablet Commonly known as: ANTIVERT Take 1 tablet (25 mg total) by mouth 3 (three) times  daily as needed for dizziness (vertigo).   metFORMIN 1000 MG tablet Commonly known as: GLUCOPHAGE Take 1,000 mg by mouth 2 (two) times daily.   NON FORMULARY Pt uses a cpap nightly   pantoprazole 40 MG tablet Commonly known as: PROTONIX Take 40 mg by mouth 2 (two) times daily.   pioglitazone 15 MG tablet Commonly known as: ACTOS Take 15 mg by mouth daily.   polyethylene glycol 17 g packet Commonly known as: MIRALAX / GLYCOLAX Take 17 g by mouth daily.   Rivaroxaban 15 MG Tabs tablet Commonly known  as: XARELTO Take 1 tablet (15 mg total) by mouth daily with supper. Start taking on: April 08, 2022 What changed: These instructions start on April 08, 2022. If you are unsure what to do until then, ask your doctor or other care provider.   senna-docusate 8.6-50 MG tablet Commonly known as: Senokot-S Take 1 tablet by mouth 2 (two) times daily.   sodium bicarbonate 650 MG tablet Take 1 tablet (650 mg total) by mouth 2 (two) times daily. What changed:  when to take this reasons to take this   tamsulosin 0.4 MG Caps capsule Commonly known as: FLOMAX Take 0.4 mg by mouth daily.        Follow-up Information     Collan Schoenfeld C, DO Follow up in 2 week(s).   Why: for staple removal Contact information: [address] [place] [address] [phone]                 Signed: Theodoro Doing Teneshia Hedeen 03/30/2022, 2:47 PM

## 2022-03-30 NOTE — Progress Notes (Signed)
Discharge instructions (including medications) discussed with and copy provided to patient/caregiver 

## 2022-03-30 NOTE — Progress Notes (Signed)
   Providing Compassionate, Quality Care - Together  NEUROSURGERY PROGRESS NOTE   S:  complains of approp HA, had some dressing saturation overnight, reinforced  O: EXAM:  BP (!) 145/75 (BP Location: Right Arm)   Pulse 98   Temp (!) 96.7 F (35.9 C) (Axillary)   Resp 14   Ht 5' 9"$  (1.753 m)   Wt 127 kg   SpO2 96%   BMI 41.35 kg/m   Awake, alert, oriented x3 PERRL Speech fluent, appropriate  CNs grossly intact  5/5 BUE/BLE  Dressing saturated w csf, clear  ASSESSMENT:  77 y.o. male with   R frontal tumor  S/p biopsy  PLAN: - I reinforced closer with additional staple, changed dressing, will monitor -poss dc home today is improved wound Postop CT stable    Thank you for allowing me to participate in this patient's care.  Please do not hesitate to call with questions or concerns.   Elwin Sleight, [facility] Cell: [phone]

## 2022-03-30 NOTE — Inpatient Diabetes Management (Signed)
Inpatient Diabetes Program Recommendations  AACE/ADA: New Consensus Statement on Inpatient Glycemic Control (2015)  Target Ranges:  Prepandial:   less than 140 mg/dL      Peak postprandial:   less than 180 mg/dL (1-2 hours)      Critically ill patients:  140 - 180 mg/dL   Lab Results  Component Value Date   GLUCAP 284 (H) 03/30/2022   HGBA1C 6.3 (H) 01/29/2022    Review of Glycemic Control  Latest Reference Range & Units 03/29/22 11:03 03/29/22 13:07 03/29/22 14:44 03/30/22 07:43 03/30/22 11:22  Glucose-Capillary 70 - 99 mg/dL 118 (H) 107 (H) 103 (H) 280 (H) 284 (H)   Diabetes history: DM 2 Outpatient Diabetes medications: Glipizide 10 mg Daily, Actos 15 mg Daily, Metformin 1000 mg bid, Lantus 40 units Daily Current orders for Inpatient glycemic control:  Glipizide 10 mg Daily Actos 15 mg Daily Metformin 1000 mg bid Novolog 0-15 units tid  A1c 6.3% on 12/23 Note: Decadron started yesterday evening dosed at 2 mg Q12 hours  Inpatient Diabetes Program Recommendations:    -  consider Semglee 10 units while on steroids -  may also need Novolog meal coverage  Thanks,  Tama Headings RN, MSN, BC-ADM Inpatient Diabetes Coordinator Team Pager [phone] (8a-5p)

## 2022-03-31 ENCOUNTER — Encounter (HOSPITAL_COMMUNITY): Payer: Self-pay | Admitting: Neurological Surgery

## 2022-03-31 LAB — GLUCOSE, CAPILLARY
Glucose-Capillary: 105 mg/dL — ABNORMAL HIGH (ref 70–99)
Glucose-Capillary: 208 mg/dL — ABNORMAL HIGH (ref 70–99)

## 2022-04-05 ENCOUNTER — Emergency Department (HOSPITAL_COMMUNITY): Payer: Medicare Other

## 2022-04-05 ENCOUNTER — Encounter (HOSPITAL_COMMUNITY): Payer: Self-pay

## 2022-04-05 ENCOUNTER — Inpatient Hospital Stay (HOSPITAL_COMMUNITY)
Admission: EM | Admit: 2022-04-05 | Discharge: 2022-04-09 | DRG: 314 | Disposition: A | Discharge status: Skilled Nursing Facility | Payer: Medicare Other | Attending: Internal Medicine | Admitting: Internal Medicine

## 2022-04-05 ENCOUNTER — Other Ambulatory Visit: Payer: Self-pay

## 2022-04-05 DIAGNOSIS — J449 Chronic obstructive pulmonary disease, unspecified: Secondary | ICD-10-CM | POA: Diagnosis present

## 2022-04-05 DIAGNOSIS — R55 Syncope and collapse: Secondary | ICD-10-CM

## 2022-04-05 DIAGNOSIS — Z7984 Long term (current) use of oral hypoglycemic drugs: Secondary | ICD-10-CM

## 2022-04-05 DIAGNOSIS — J210 Acute bronchiolitis due to respiratory syncytial virus: Secondary | ICD-10-CM

## 2022-04-05 DIAGNOSIS — Z79899 Other long term (current) drug therapy: Secondary | ICD-10-CM

## 2022-04-05 DIAGNOSIS — I129 Hypertensive chronic kidney disease with stage 1 through stage 4 chronic kidney disease, or unspecified chronic kidney disease: Secondary | ICD-10-CM | POA: Diagnosis present

## 2022-04-05 DIAGNOSIS — I959 Hypotension, unspecified: Secondary | ICD-10-CM

## 2022-04-05 DIAGNOSIS — Z79890 Hormone replacement therapy (postmenopausal): Secondary | ICD-10-CM

## 2022-04-05 DIAGNOSIS — J440 Chronic obstructive pulmonary disease with acute lower respiratory infection: Secondary | ICD-10-CM | POA: Diagnosis present

## 2022-04-05 DIAGNOSIS — I4510 Unspecified right bundle-branch block: Secondary | ICD-10-CM | POA: Diagnosis present

## 2022-04-05 DIAGNOSIS — J121 Respiratory syncytial virus pneumonia: Secondary | ICD-10-CM | POA: Diagnosis present

## 2022-04-05 DIAGNOSIS — R197 Diarrhea, unspecified: Secondary | ICD-10-CM | POA: Diagnosis present

## 2022-04-05 DIAGNOSIS — K219 Gastro-esophageal reflux disease without esophagitis: Secondary | ICD-10-CM | POA: Diagnosis present

## 2022-04-05 DIAGNOSIS — I9589 Other hypotension: Secondary | ICD-10-CM | POA: Diagnosis not present

## 2022-04-05 DIAGNOSIS — Z85528 Personal history of other malignant neoplasm of kidney: Secondary | ICD-10-CM

## 2022-04-05 DIAGNOSIS — I4819 Other persistent atrial fibrillation: Secondary | ICD-10-CM | POA: Diagnosis present

## 2022-04-05 DIAGNOSIS — N183 Chronic kidney disease, stage 3 unspecified: Secondary | ICD-10-CM | POA: Diagnosis present

## 2022-04-05 DIAGNOSIS — G4733 Obstructive sleep apnea (adult) (pediatric): Secondary | ICD-10-CM | POA: Diagnosis present

## 2022-04-05 DIAGNOSIS — E869 Volume depletion, unspecified: Secondary | ICD-10-CM | POA: Diagnosis present

## 2022-04-05 DIAGNOSIS — Z1152 Encounter for screening for COVID-19: Secondary | ICD-10-CM

## 2022-04-05 DIAGNOSIS — N1832 Chronic kidney disease, stage 3b: Secondary | ICD-10-CM | POA: Diagnosis present

## 2022-04-05 DIAGNOSIS — Z87891 Personal history of nicotine dependence: Secondary | ICD-10-CM

## 2022-04-05 DIAGNOSIS — E1122 Type 2 diabetes mellitus with diabetic chronic kidney disease: Secondary | ICD-10-CM | POA: Diagnosis present

## 2022-04-05 DIAGNOSIS — E039 Hypothyroidism, unspecified: Secondary | ICD-10-CM | POA: Diagnosis present

## 2022-04-05 DIAGNOSIS — G9389 Other specified disorders of brain: Secondary | ICD-10-CM

## 2022-04-05 DIAGNOSIS — Z7901 Long term (current) use of anticoagulants: Secondary | ICD-10-CM

## 2022-04-05 DIAGNOSIS — Z6841 Body Mass Index (BMI) 40.0 and over, adult: Secondary | ICD-10-CM

## 2022-04-05 DIAGNOSIS — R5381 Other malaise: Secondary | ICD-10-CM | POA: Diagnosis present

## 2022-04-05 DIAGNOSIS — E861 Hypovolemia: Secondary | ICD-10-CM

## 2022-04-05 DIAGNOSIS — G939 Disorder of brain, unspecified: Secondary | ICD-10-CM | POA: Diagnosis present

## 2022-04-05 DIAGNOSIS — Z794 Long term (current) use of insulin: Secondary | ICD-10-CM

## 2022-04-05 LAB — CBC
HCT: 32 % — ABNORMAL LOW (ref 39.0–52.0)
Hemoglobin: 10.5 g/dL — ABNORMAL LOW (ref 13.0–17.0)
MCH: 32.4 pg (ref 26.0–34.0)
MCHC: 32.8 g/dL (ref 30.0–36.0)
MCV: 98.8 fL (ref 80.0–100.0)
Platelets: 128 10*3/uL — ABNORMAL LOW (ref 150–400)
RBC: 3.24 MIL/uL — ABNORMAL LOW (ref 4.22–5.81)
RDW: 15.9 % — ABNORMAL HIGH (ref 11.5–15.5)
WBC: 6.3 10*3/uL (ref 4.0–10.5)
nRBC: 0 % (ref 0.0–0.2)

## 2022-04-05 LAB — URINALYSIS, ROUTINE W REFLEX MICROSCOPIC
Bacteria, UA: NONE SEEN
Bilirubin Urine: NEGATIVE
Glucose, UA: NEGATIVE mg/dL
Ketones, ur: NEGATIVE mg/dL
Leukocytes,Ua: NEGATIVE
Nitrite: NEGATIVE
Protein, ur: NEGATIVE mg/dL
Specific Gravity, Urine: 1.01 (ref 1.005–1.030)
pH: 5 (ref 5.0–8.0)

## 2022-04-05 LAB — BASIC METABOLIC PANEL
Anion gap: 5 (ref 5–15)
BUN: 55 mg/dL — ABNORMAL HIGH (ref 8–23)
CO2: 23 mmol/L (ref 22–32)
Calcium: 7.5 mg/dL — ABNORMAL LOW (ref 8.9–10.3)
Chloride: 111 mmol/L (ref 98–111)
Creatinine, Ser: 1.92 mg/dL — ABNORMAL HIGH (ref 0.61–1.24)
GFR, Estimated: 36 mL/min — ABNORMAL LOW (ref >60)
Glucose, Bld: 141 mg/dL — ABNORMAL HIGH (ref 70–99)
Potassium: 4.3 mmol/L (ref 3.5–5.1)
Sodium: 139 mmol/L (ref 135–145)

## 2022-04-05 LAB — MAGNESIUM: Magnesium: 1.6 mg/dL — ABNORMAL LOW (ref 1.7–2.4)

## 2022-04-05 LAB — RESP PANEL BY RT-PCR (RSV, FLU A&B, COVID)  RVPGX2
Influenza A by PCR: NEGATIVE
Influenza B by PCR: NEGATIVE
Resp Syncytial Virus by PCR: POSITIVE — AB
SARS Coronavirus 2 by RT PCR: NEGATIVE

## 2022-04-05 LAB — BRAIN NATRIURETIC PEPTIDE: B Natriuretic Peptide: 110.4 pg/mL — ABNORMAL HIGH (ref 0.0–100.0)

## 2022-04-05 MED ORDER — CALCIUM CARBONATE ANTACID 500 MG PO CHEW
800.0000 mg | CHEWABLE_TABLET | Freq: Once | ORAL | Status: AC
  Administered 2022-04-05: 800 mg via ORAL
  Filled 2022-04-05: qty 4

## 2022-04-05 MED ORDER — DIPHENOXYLATE-ATROPINE 2.5-0.025 MG PO TABS: 1.0000 | ORAL_TABLET | Freq: Four times a day (QID) | ORAL | Status: DC | PRN

## 2022-04-05 MED ORDER — INSULIN ASPART 100 UNIT/ML IJ SOLN
0.0000 [IU] | Freq: Three times a day (TID) | INTRAMUSCULAR | Status: DC
  Administered 2022-04-06 (×2): 7 [IU] via SUBCUTANEOUS
  Administered 2022-04-06: 3 [IU] via SUBCUTANEOUS
  Administered 2022-04-07: 9 [IU] via SUBCUTANEOUS
  Administered 2022-04-07: 7 [IU] via SUBCUTANEOUS
  Administered 2022-04-07: 3 [IU] via SUBCUTANEOUS
  Administered 2022-04-08: 7 [IU] via SUBCUTANEOUS
  Administered 2022-04-08 – 2022-04-09 (×2): 5 [IU] via SUBCUTANEOUS
  Administered 2022-04-09: 3 [IU] via SUBCUTANEOUS
  Filled 2022-04-05: qty 0.09

## 2022-04-05 MED ORDER — MAGNESIUM SULFATE 2 GM/50ML IV SOLN
2.0000 g | Freq: Once | INTRAVENOUS | Status: AC
  Administered 2022-04-05: 2 g via INTRAVENOUS
  Filled 2022-04-05: qty 50

## 2022-04-05 MED ORDER — LACTATED RINGERS IV SOLN: INTRAVENOUS | Status: DC

## 2022-04-05 MED ORDER — ATORVASTATIN CALCIUM 40 MG PO TABS
40.0000 mg | ORAL_TABLET | Freq: Every day | ORAL | Status: DC
  Administered 2022-04-06 – 2022-04-09 (×4): 40 mg via ORAL
  Filled 2022-04-05 (×4): qty 1

## 2022-04-05 MED ORDER — ESCITALOPRAM OXALATE 10 MG PO TABS
10.0000 mg | ORAL_TABLET | Freq: Every day | ORAL | Status: DC
  Administered 2022-04-06 – 2022-04-09 (×4): 10 mg via ORAL
  Filled 2022-04-05 (×4): qty 1

## 2022-04-05 MED ORDER — PANTOPRAZOLE SODIUM 40 MG PO TBEC
40.0000 mg | DELAYED_RELEASE_TABLET | Freq: Two times a day (BID) | ORAL | Status: DC
  Administered 2022-04-05 – 2022-04-09 (×8): 40 mg via ORAL
  Filled 2022-04-05 (×8): qty 1

## 2022-04-05 MED ORDER — SODIUM CHLORIDE 0.9 % IV BOLUS
1000.0000 mL | Freq: Once | INTRAVENOUS | Status: AC
  Administered 2022-04-05: 1000 mL via INTRAVENOUS

## 2022-04-05 MED ORDER — TAMSULOSIN HCL 0.4 MG PO CAPS
0.4000 mg | ORAL_CAPSULE | Freq: Every day | ORAL | Status: DC
  Administered 2022-04-06 – 2022-04-09 (×4): 0.4 mg via ORAL
  Filled 2022-04-05 (×4): qty 1

## 2022-04-05 MED ORDER — DEXAMETHASONE 2 MG PO TABS
2.0000 mg | ORAL_TABLET | Freq: Every day | ORAL | Status: DC
  Administered 2022-04-06: 2 mg via ORAL
  Filled 2022-04-05: qty 1

## 2022-04-05 MED ORDER — LEVOTHYROXINE SODIUM 50 MCG PO TABS
150.0000 ug | ORAL_TABLET | Freq: Every day | ORAL | Status: DC
  Administered 2022-04-06 – 2022-04-09 (×4): 150 ug via ORAL
  Filled 2022-04-05 (×4): qty 1

## 2022-04-05 MED ORDER — FERROUS SULFATE 325 (65 FE) MG PO TABS
325.0000 mg | ORAL_TABLET | Freq: Two times a day (BID) | ORAL | Status: DC
  Administered 2022-04-06 – 2022-04-09 (×7): 325 mg via ORAL
  Filled 2022-04-05 (×7): qty 1

## 2022-04-05 NOTE — Assessment & Plan Note (Signed)
-  without any hypoxia -symptomatic management

## 2022-04-05 NOTE — Assessment & Plan Note (Signed)
-  keep on sliding scale insulin while on steroid taper

## 2022-04-05 NOTE — Assessment & Plan Note (Signed)
-  stable. Not in exacerbation.

## 2022-04-05 NOTE — Assessment & Plan Note (Addendum)
-  s/p ablation holding xarelto until 04/08/22 per neurosurgery due to recent brain lesion biopsy

## 2022-04-05 NOTE — Assessment & Plan Note (Signed)
continue levothyroxine

## 2022-04-05 NOTE — Assessment & Plan Note (Addendum)
-  SBP of 60 while in the field improved and normalized in ED with IV fluids -no true orthostatic vital signs but was symptomatic with positional dizziness  -CT head negative for acute findings s/p recent brain biopsy -RSV is positive which could contribute to some of his symptoms. He also has been having persistent diarrhea. However not really convinced it would cause such profound hypotension. Will check echocardiogram and keep on continuous telemetry -check TSH  -continuous IV fluids overnight

## 2022-04-05 NOTE — ED Notes (Signed)
Patient transported to CT 

## 2022-04-05 NOTE — ED Notes (Signed)
Orthostatics obtained and MD made aware that pt was very winded when standing. Pt denies pain but states has been more out of breath the past few days.

## 2022-04-05 NOTE — Assessment & Plan Note (Signed)
-  stable around baseline with creatinine of 1.9

## 2022-04-05 NOTE — H&P (Addendum)
History and Physical    Patient: Patrick Rocha [id number] DOB: 1945/06/25 DOA: 04/05/2022 DOS: the patient was seen and examined on 04/05/2022 PCP: Elliot Dally, MD  Patient coming from: Home  Chief Complaint:  Chief Complaint  Patient presents with   Hypotension   HPI: Patrick Rocha is a 77 y.o. male with medical history significant of HTN, atrial fibrillation on Xarelto, T2DM, COPD, CKD3b, OSA on CPAP, brain mass with recent biopsy who was brought into ED for hypotension and syncope.   He was getting up from the toilet after a bowel movement when he felt dizzy and lightheaded. Family thought he was very pale. Denies any chest pain or palpitations. No nausea, vomiting. Has been having diarrhea for at least a week. Trying to stay hydrated with water and sodas.   He found in the field with BP 60/40 with improvement following fluids. He was admitted at the end of January also with hypotension and had AKI with creatinine of 3 at the time improved with fluid resuscitation.   He also recently had elective stereotactic right frontal biopsy of lesion on 03/30/2022 with neurosurgery.   In the ED, BP have normalized to 115/73. No true orthostatic vital signs but feels symptomatic with standing.  No leukocytosis. Hgb baseline around 10.5.   Na of 139, K of 4.3, creatinine of 1.92 with baseline of 1.7-1.9. Ca of 7.5, Mg of 1.6.  EKG with A.fib and RBBB.  Review of Systems: As mentioned in the history of present illness. All other systems reviewed and are negative. 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   Sleep apnea    uses CPAP   Past Surgical History:  Procedure Laterality Date   IR GENERIC HISTORICAL  02/11/2016   IR RADIOLOGIST EVAL & MGMT 02/11/2016 Corrie Mckusick, DO [facility] INTERV RAD   IR RADIOLOGIST EVAL & MGMT  04/12/2016   IR RADIOLOGIST EVAL & MGMT  12/07/2016   IR RADIOLOGIST EVAL & MGMT  06/06/2017   IR RADIOLOGIST EVAL & MGMT  09/18/2018   IR RADIOLOGIST EVAL & MGMT  10/01/2019   IR RADIOLOGIST EVAL & MGMT  09/08/2020   PR DURAL GRAFT SPINAL Right 03/29/2022   Procedure: FRONTAL FRAMELESS STEREOTACTIC BIOPSY BRAIN WITH STEALTH;  Surgeon: Karsten Ro, DO;  Location: [facility];  Service: Neurosurgery;  Laterality: Right;   radioactive iodine thyroid     pt states this was many years ago   Social History:  reports that he quit smoking about 30 years ago. His smoking use included cigarettes. He has never used smokeless tobacco. He reports that he does not currently use alcohol. He reports that he does not use drugs.  No Known Allergies  History reviewed. No pertinent family history.  Prior to Admission medications   Medication Sig Start Date End Date Taking? Authorizing Provider  atorvastatin (LIPITOR) 40 MG tablet Take 40 mg by mouth daily.    [provider]  benazepril (LOTENSIN) 40 MG tablet Take 40 mg by mouth daily.    [provider]  carvedilol (COREG) 12.5 MG tablet Take 12.5 mg by mouth 2 (two) times daily with a meal.    [provider]  Cinnamon 500 MG capsule Take 500 mg by mouth in the morning and at bedtime.    [provider]  dexamethasone (DECADRON) 2 MG tablet Take 1 tablet (2 mg total) by mouth 2 (two) times daily for 4 days, THEN 1 tablet (2 mg total) daily for 4 days, THEN 1 tablet (2 mg total) every other day for 4 days. 03/30/22 04/11/22  Dawley, Troy C, DO  diltiazem (DILACOR XR) 240 MG 24 hr capsule Take 240 mg by mouth daily.    [provider]  escitalopram (LEXAPRO) 10 MG tablet Take 10 mg by  mouth daily.    [provider]  ferrous sulfate 325 (65 FE) MG tablet Take 325 mg by mouth 2 (two) times daily with a meal.    [provider]  fexofenadine (ALLEGRA) 180 MG tablet Take 180 mg by mouth daily.    [provider]  furosemide (LASIX) 20 MG tablet Take 20 mg by mouth daily as needed for edema.    [provider]  glipiZIDE (GLUCOTROL) 10 MG tablet Take 10 mg by mouth daily.    [provider]  HYDROcodone-acetaminophen (NORCO/VICODIN) 5-325 MG tablet Take 1 tablet by mouth every 6 (six) hours as needed for moderate pain. 03/30/22   Dawley, Troy C, DO  insulin aspart (NOVOLOG) 100 UNIT/ML injection Inject 4 Units into the skin 3 (three) times daily with meals. Patient not taking: Reported on 03/28/2022 03/04/22   Shelly Coss, MD  ketoconazole (NIZORAL) 2 % cream Apply 1 Application topically daily. Patient taking differently: Apply 1 Application topically daily as needed for irritation. 09/02/21   Harris, Abigail, PA-C  LANTUS SOLOSTAR 100 UNIT/ML Solostar Pen Inject 40 Units into the skin daily as needed (high blood sugar). 02/21/22   [provider]  levothyroxine (SYNTHROID, LEVOTHROID) 150 MCG tablet Take 150 mcg by mouth daily before breakfast.    [provider]  meclizine (ANTIVERT) 25 MG tablet Take 1 tablet (25 mg total) by mouth 3 (three) times daily as needed for dizziness (vertigo). 01/30/22   Bonnielee Haff, MD  metFORMIN (GLUCOPHAGE) 1000 MG tablet Take 1,000 mg by mouth 2 (two) times daily.    [provider]  NON FORMULARY Pt uses a cpap nightly    [provider]  Omega-3 Fatty Acids (FISH OIL) 1000 MG CAPS Take 1,000 mg by mouth 3 (three) times daily.    [provider]  pantoprazole (PROTONIX) 40 MG tablet Take 40 mg by mouth 2 (two) times daily.    [provider]  pioglitazone (ACTOS) 15 MG tablet Take 15 mg by mouth daily.    [provider]  polyethylene  glycol (MIRALAX / GLYCOLAX) 17 g packet Take 17 g by mouth daily. Patient not taking: Reported on 03/28/2022 03/05/22   Shelly Coss, MD  Rivaroxaban (XARELTO) 15 MG TABS tablet Take 1 tablet (15 mg total) by mouth daily with supper. 04/08/22   Dawley, Troy C, DO  senna-docusate (SENOKOT-S) 8.6-50 MG tablet Take 1 tablet by mouth 2 (two) times daily. Patient not taking: Reported on 03/28/2022 03/04/22   Shelly Coss, MD  sodium bicarbonate 650 MG tablet Take 1 tablet (650 mg total) by mouth 2 (two) times daily. Patient taking differently: Take 650 mg by mouth 2 (two) times daily as needed for heartburn. 03/04/22   Shelly Coss,  MD  tamsulosin (FLOMAX) 0.4 MG CAPS capsule Take 0.4 mg by mouth daily.    [provider]    Physical Exam: Vitals:   04/05/22 2000 04/05/22 2030 04/05/22 2115 04/05/22 2130  BP: 105/71 (!) 101/58 115/72 115/66  Pulse: 83 100 69 79  Resp: '13 16 10 11  '$ Temp:   (!) 97.5 F (36.4 C)   TempSrc:   Oral   SpO2: 96% 97% 100% 98%  Weight:      Height:       Constitutional: NAD, calm, comfortable, ill-appearing obese male with generalized pallor Eyes:  lids and conjunctivae normal ENMT: Mucous membranes are moist. Neck: normal, supple Respiratory: clear to auscultation bilaterally, no wheezing, no crackles. Normal respiratory effort. No accessory muscle use.  Cardiovascular: Regular rate and rhythm, no murmurs / rubs / gallops. No extremity edema.  Abdomen: Soft, non-distended, no tenderness,  Bowel sounds positive.  Musculoskeletal: no clubbing / cyanosis. No joint deformity upper and lower extremities. LE wrapped in compression dressing bilaterally.  Skin: no rashes, lesions, ulcers.  Neurologic: CN 2-12 grossly intact.   Psychiatric: Normal judgment and insight. Alert and oriented x 3. Normal mood. Data Reviewed:  See HPI  Assessment and Plan: * Hypotension -SBP of 60 while in the field improved and normalized in ED with IV fluids -no true  orthostatic vital signs but was symptomatic with positional dizziness  -CT head negative for acute findings s/p recent brain biopsy -RSV is positive which could contribute to some of his symptoms. He also has been having persistent diarrhea. However not really convinced it would cause such profound hypotension. Will check echocardiogram and keep on continuous telemetry -check TSH  -continuous IV fluids overnight  Brain mass -elective stereotactic right frontal biopsy of lesion on 03/30/2022 with neurosurgery.  -continue steroid taper  Stage 3b chronic kidney disease (CKD) (HCC) - baseline SCr 1.7-1.9 -stable around baseline with creatinine of 1.9  OSA on CPAP -continue nightly CPAP  Persistent atrial fibrillation (HCC) -s/p ablation holding xarelto until 04/08/22 per neurosurgery due to recent brain lesion biopsy  COPD (chronic obstructive pulmonary disease) ([place]) -stable. Not in exacerbation.  Controlled type 2 diabetes mellitus with stage 3 chronic kidney disease, without long-term current use of insulin (HCC) -keep on sliding scale insulin while on steroid taper  RSV (respiratory syncytial virus pneumonia) -without any hypoxia -symptomatic management  Hypothyroidism -continue levothyroxine      Advance Care Planning:   Code Status: Full Code   Consults: none  Family Communication: none at bedside  Severity of Illness: The appropriate patient status for this patient is OBSERVATION. Observation status is judged to be reasonable and necessary in order to provide the required intensity of service to ensure the patient's safety. The patient's presenting symptoms, physical exam findings, and initial radiographic and laboratory data in the context of their medical condition is felt to place them at decreased risk for further clinical deterioration. Furthermore, it is anticipated that the patient will be medically stable for discharge from the hospital within 2 midnights of  admission.   Author: Orene Desanctis, DO 04/05/2022 11:03 PM  For on call review [email or web address].

## 2022-04-05 NOTE — Assessment & Plan Note (Signed)
-   continue nightly CPAP °

## 2022-04-05 NOTE — ED Triage Notes (Addendum)
Pt coming from home via EMS. Pt called out for dizziness and feeling unwell. Upon arrival patient pale with cold extremities. First BP 60 systolic palpated. Pt denies any LOC or recent falls. Hx brain lesion with biopsy last week.  250cc NS IV

## 2022-04-05 NOTE — Assessment & Plan Note (Addendum)
-  elective stereotactic right frontal biopsy of lesion on 03/30/2022 with neurosurgery.  -continue steroid taper

## 2022-04-05 NOTE — ED Provider Notes (Signed)
[facility] Provider Note   CSN: [id number] Arrival date & time: 04/05/22  1714     History {Add pertinent medical, surgical, social history, OB history to HPI:1} Chief Complaint  Patient presents with   Hypotension    Patrick Rocha is a 77 y.o. male presented to ED by EMS concern for near syncope.  The patient reports that he has been feeling lightheaded particularly with standing for the past several days.  Today he was even at rest was feeling very lightheaded.  He called EMS out on scene.  EMS noted the patient had a blood pressure of systolic initially palpated on scene.  Patient was given 250 cc of fluid en route to the hospital with improvement of his blood pressure.  Patient says he is feeling better since his arrival.  The patient tells me he drinks "enough water" but cannot quantify how much water he drinks daily, tells me that he "mixes it up" with sodas and other types of drinks.  Medical record review shows the patient was admitted to the hospital in January approximate 1 month ago for dehydration and AKI, at that time found to also have orthostatic hypotension and low initial blood pressure of 60s.  Separately the patient was admitted to the hospital discharge 7 days ago after elective stereotactic right frontal brain biopsy.  The patient does take Xarelto, but has not resumed his medication until March 1 per instructions from his neurosurgeon.  There were no falls or head injuries this week.  Update, was able to discuss with the patient's son on the phone.  His son had been visiting the patient today and concerned about the patient's significant lightheadedness and very low, unreadable blood pressure.  He says the patient's been having labile issues with his blood pressures for some time.  The patient was in rehab after his hospital stay in January, prior to his biopsy, but did not go back to rehab afterwards.  HPI      Home Medications Prior to Admission medications   Medication Sig Start Date End Date Taking? Authorizing Provider  atorvastatin (LIPITOR) 40 MG tablet Take 40 mg by mouth daily.    [provider]  benazepril (LOTENSIN) 40 MG tablet Take 40 mg by mouth daily.    [provider]  carvedilol (COREG) 12.5 MG tablet Take 12.5 mg by mouth 2 (two) times daily with a meal.    [provider]  Cinnamon 500 MG capsule Take 500 mg by mouth in the morning and at bedtime.    [provider]  dexamethasone (DECADRON) 2 MG tablet Take 1 tablet (2 mg total) by mouth 2 (two) times daily for 4 days, THEN 1 tablet (2 mg total) daily for 4 days, THEN 1 tablet (2 mg total) every other day for 4 days. 03/30/22 04/11/22  Dawley, Troy C, DO  diltiazem (DILACOR XR) 240 MG 24 hr capsule Take 240 mg by mouth daily.    [provider]  escitalopram (LEXAPRO) 10 MG tablet Take 10 mg by mouth daily.    [provider]  ferrous sulfate 325 (65 FE) MG tablet Take 325 mg by mouth 2 (two) times daily with a meal.    [provider]  fexofenadine (ALLEGRA) 180 MG tablet Take 180 mg by mouth daily.    [provider]  furosemide (LASIX) 20 MG tablet Take 20 mg by mouth daily as needed for edema.    [provider]  glipiZIDE (GLUCOTROL) 10 MG tablet Take 10 mg by mouth daily.    [provider]  HYDROcodone-acetaminophen (NORCO/VICODIN) 5-325 MG tablet Take 1 tablet by mouth every 6 (six) hours as needed for moderate pain. 03/30/22   Dawley, Troy C, DO  insulin aspart (NOVOLOG) 100 UNIT/ML injection Inject 4 Units into the skin 3 (three) times daily with meals. Patient not taking: Reported on 03/28/2022 03/04/22   Shelly Coss, MD  ketoconazole (NIZORAL) 2 % cream Apply 1 Application topically daily. Patient taking differently: Apply 1 Application topically daily as needed for irritation. 09/02/21   Harris, Abigail, PA-C  LANTUS SOLOSTAR 100  UNIT/ML Solostar Pen Inject 40 Units into the skin daily as needed (high blood sugar). 02/21/22   [provider]  levothyroxine (SYNTHROID, LEVOTHROID) 150 MCG tablet Take 150 mcg by mouth daily before breakfast.    [provider]  meclizine (ANTIVERT) 25 MG tablet Take 1 tablet (25 mg total) by mouth 3 (three) times daily as needed for dizziness (vertigo). 01/30/22   Bonnielee Haff, MD  metFORMIN (GLUCOPHAGE) 1000 MG tablet Take 1,000 mg by mouth 2 (two) times daily.    [provider]  NON FORMULARY Pt uses a cpap nightly    [provider]  Omega-3 Fatty Acids (FISH OIL) 1000 MG CAPS Take 1,000 mg by mouth 3 (three) times daily.    [provider]  pantoprazole (PROTONIX) 40 MG tablet Take 40 mg by mouth 2 (two) times daily.    [provider]  pioglitazone (ACTOS) 15 MG tablet Take 15 mg by mouth daily.    [provider]  polyethylene glycol (MIRALAX / GLYCOLAX) 17 g packet Take 17 g by mouth daily. Patient not taking: Reported on 03/28/2022 03/05/22   Shelly Coss, MD  Rivaroxaban (XARELTO) 15 MG TABS tablet Take 1 tablet (15 mg total) by mouth daily with supper. 04/08/22   Dawley, Troy C, DO  senna-docusate (SENOKOT-S) 8.6-50 MG tablet Take 1 tablet by mouth 2 (two) times daily. Patient not taking: Reported on 03/28/2022 03/04/22   Shelly Coss, MD  sodium bicarbonate 650 MG tablet Take 1 tablet (650 mg total) by mouth 2 (two) times daily. Patient taking differently: Take 650 mg by mouth 2 (two) times daily as needed for heartburn. 03/04/22   Shelly Coss, MD  tamsulosin (FLOMAX) 0.4 MG CAPS capsule Take 0.4 mg by mouth daily.    [provider]      Allergies    Patient has no known allergies.    Review of Systems   Review of Systems  Physical Exam Updated Vital Signs BP 115/72 (BP Location: Left Arm)   Pulse 69   Temp (!) 97.5 F (36.4 C) (Oral)   Resp 10   Ht '5\' 9"'$  (1.753 m)   Wt 127 kg   SpO2 100%    BMI 41.35 kg/m  Physical Exam Constitutional:      General: He is not in acute distress. HENT:     Head: Normocephalic and atraumatic.  Eyes:     Conjunctiva/sclera: Conjunctivae normal.     Pupils: Pupils are equal, round, and reactive to light.  Cardiovascular:     Rate and Rhythm: Regular rhythm. Tachycardia present.  Pulmonary:     Effort: Pulmonary effort is normal. No respiratory distress.  Abdominal:     General: There is no distension.     Tenderness: There is no abdominal tenderness.  Skin:    General: Skin is warm and dry.  Neurological:     General: No focal deficit present.     Mental Status: He is alert and oriented to person, place, and time. Mental status is at baseline.  Psychiatric:        Mood and Affect: Mood normal.        Behavior: Behavior normal.     ED Results / Procedures / Treatments   Labs (all labs ordered are listed, but only abnormal results are displayed) Labs Reviewed  BASIC METABOLIC PANEL - Abnormal; Notable for the following components:      Result Value   Glucose, Bld 141 (*)    BUN 55 (*)    Creatinine, Ser 1.92 (*)    Calcium 7.5 (*)    GFR, Estimated 36 (*)    All other components within normal limits  CBC - Abnormal; Notable for the following components:   RBC 3.24 (*)    Hemoglobin 10.5 (*)    HCT 32.0 (*)    RDW 15.9 (*)    Platelets 128 (*)    All other components within normal limits  URINALYSIS, ROUTINE W REFLEX MICROSCOPIC - Abnormal; Notable for the following components:   Color, Urine STRAW (*)    Hgb urine dipstick SMALL (*)    All other components within normal limits  BRAIN NATRIURETIC PEPTIDE - Abnormal; Notable for the following components:   B Natriuretic Peptide 110.4 (*)    All other components within normal limits  MAGNESIUM - Abnormal; Notable for the following components:   Magnesium 1.6 (*)    All other components within normal limits  RESP PANEL BY RT-PCR (RSV, FLU A&B, COVID)  [id number]     EKG EKG Interpretation  Date/Time:  Tuesday April 05 2022 17:25:30 EST Ventricular Rate:  90 PR Interval:    QRS Duration: 136 QT Interval:  370 QTC Calculation: 453 R Axis:   0 Text Interpretation: Atrial fibrillation Right bundle branch block I Confirmed by Octaviano Glow [phone]) on 04/05/2022 7:04:43 PM  Radiology DG Chest Portable 1 View  Result Date: 04/05/2022 CLINICAL DATA:  Syncope. EXAM: PORTABLE CHEST 1 VIEW COMPARISON:  February 27, 2022. FINDINGS: The heart size and mediastinal contours are within normal limits. Both lungs are clear. The visualized skeletal structures are unremarkable. IMPRESSION: No active disease. Electronically Signed   By: Marijo Conception M.D.   On: 04/05/2022 18:07    Procedures Procedures  {Document cardiac monitor, telemetry assessment procedure when appropriate:1}  Medications Ordered in ED Medications  magnesium sulfate IVPB 2 g 50 mL (has no administration in time range)  calcium carbonate (TUMS - dosed in mg elemental calcium) chewable tablet 800 mg of elemental calcium (has no administration in time range)  sodium chloride 0.9 % bolus 1,000 mL (0 mLs Intravenous Stopped 04/05/22 2058)    ED Course/ Medical Decision Making/ A&P   {   Click here for ABCD2, HEART and other calculatorsREFRESH Note before signing :1}                          Medical Decision Making Amount and/or Complexity of Data Reviewed Labs: ordered. Radiology: ordered. ECG/medicine tests: ordered.  Risk Prescription drug management.   This patient presents to the ED with concern for near syncope, lightheadedness. This involves an extensive number of treatment options, and is a complaint that carries with it a high risk of complications and morbidity.  The differential diagnosis includes dehydration and orthostatic hypotension versus arrhythmia versus anemia  versus other  Co-morbidities that complicate the patient evaluation: History of prior hospitalization  for dehydration and orthostasis at high risk of recurring complication  Additional history obtained from EMS, patient's son bobby by phone  External records from outside source obtained and reviewed including most recent hospital discharge summary from January and February  I ordered and personally interpreted labs.  The pertinent results include: Hypocalcemia, hypomagnesemia.  Creatinine mildly elevated at 1.9 but close to baseline.  BUN 55.  Potassium within normal limits.  No significant acute worsening anemia.  Hemoglobin 10.5.    I ordered imaging studies including x-ray of the chest I independently visualized and interpreted imaging which showed no emergent findings I agree with the radiologist interpretation  The patient was maintained on a cardiac monitor.  I personally viewed and interpreted the cardiac monitored which showed an underlying rhythm of: Atrial fibrillation with heart rate 80-90 bpm  Per my interpretation the patient's ECG shows atrial fibrillation that is rate controlled  I ordered medication including Iv fluid for hydration, PO calcium, IV magnesium  I have reviewed the patients home medicines and have made adjustments as needed  Test Considered: lower suspicion for acute PE in this clinical setting; low suspicion for ICH.  I don't believe a new intracranial lesion or bleed is causing the hypotension at the root of his lightheadedness.  After the interventions noted above, I reevaluated the patient and found that they have: stayed the same  Patient very unstable on feet, complaining of lightheadedness when standing, although BP remained stable.  His orthostatic vital signs which were checked prior to fluids did not show any significant change in his blood pressure or heart rate with sitting or standing.  Dispostion:  After consideration of the diagnostic results and the patients response to treatment, I feel that the patent would benefit from medical admission.  At  this point we consider echocardiogram as this has not been done in the past for the patient's ongoing episodes of hypotension.  This may be related to orthostasis from intracranial surgery or increased vagal tone.  At this point the patient does remain a higher fall risk, particularly if he plans to resume his Xarelto in 4 days as recommended by his neurosurgeon.  He may benefit from PT evaluation on the inpatient side.   {Document critical care time when appropriate:1} {Document review of labs and clinical decision tools ie heart score, Chads2Vasc2 etc:1}  {Document your independent review of radiology images, and any outside records:1} {Document your discussion with family members, caretakers, and with consultants:1} {Document social determinants of health affecting pt's care:1} {Document your decision making why or why not admission, treatments were needed:1} Final Clinical Impression(s) / ED Diagnoses Final diagnoses:  None    Rx / DC Orders ED Discharge Orders     None

## 2022-04-06 ENCOUNTER — Inpatient Hospital Stay (HOSPITAL_COMMUNITY): Payer: Medicare Other

## 2022-04-06 ENCOUNTER — Other Ambulatory Visit: Payer: Self-pay | Admitting: Radiation Therapy

## 2022-04-06 DIAGNOSIS — Z87891 Personal history of nicotine dependence: Secondary | ICD-10-CM | POA: Diagnosis not present

## 2022-04-06 DIAGNOSIS — I4819 Other persistent atrial fibrillation: Secondary | ICD-10-CM | POA: Diagnosis present

## 2022-04-06 DIAGNOSIS — K219 Gastro-esophageal reflux disease without esophagitis: Secondary | ICD-10-CM | POA: Diagnosis present

## 2022-04-06 DIAGNOSIS — R55 Syncope and collapse: Secondary | ICD-10-CM | POA: Diagnosis present

## 2022-04-06 DIAGNOSIS — J440 Chronic obstructive pulmonary disease with acute lower respiratory infection: Secondary | ICD-10-CM | POA: Diagnosis present

## 2022-04-06 DIAGNOSIS — E039 Hypothyroidism, unspecified: Secondary | ICD-10-CM | POA: Diagnosis present

## 2022-04-06 DIAGNOSIS — Z7984 Long term (current) use of oral hypoglycemic drugs: Secondary | ICD-10-CM | POA: Diagnosis not present

## 2022-04-06 DIAGNOSIS — Z1152 Encounter for screening for COVID-19: Secondary | ICD-10-CM | POA: Diagnosis not present

## 2022-04-06 DIAGNOSIS — N1832 Chronic kidney disease, stage 3b: Secondary | ICD-10-CM | POA: Diagnosis present

## 2022-04-06 DIAGNOSIS — G9389 Other specified disorders of brain: Secondary | ICD-10-CM | POA: Diagnosis not present

## 2022-04-06 DIAGNOSIS — E861 Hypovolemia: Secondary | ICD-10-CM | POA: Diagnosis not present

## 2022-04-06 DIAGNOSIS — Z6841 Body Mass Index (BMI) 40.0 and over, adult: Secondary | ICD-10-CM | POA: Diagnosis not present

## 2022-04-06 DIAGNOSIS — Z7901 Long term (current) use of anticoagulants: Secondary | ICD-10-CM | POA: Diagnosis not present

## 2022-04-06 DIAGNOSIS — I4510 Unspecified right bundle-branch block: Secondary | ICD-10-CM | POA: Diagnosis present

## 2022-04-06 DIAGNOSIS — Z79890 Hormone replacement therapy (postmenopausal): Secondary | ICD-10-CM | POA: Diagnosis not present

## 2022-04-06 DIAGNOSIS — G939 Disorder of brain, unspecified: Secondary | ICD-10-CM | POA: Diagnosis present

## 2022-04-06 DIAGNOSIS — Z79899 Other long term (current) drug therapy: Secondary | ICD-10-CM | POA: Diagnosis not present

## 2022-04-06 DIAGNOSIS — E1122 Type 2 diabetes mellitus with diabetic chronic kidney disease: Secondary | ICD-10-CM | POA: Diagnosis not present

## 2022-04-06 DIAGNOSIS — Z794 Long term (current) use of insulin: Secondary | ICD-10-CM | POA: Diagnosis not present

## 2022-04-06 DIAGNOSIS — I129 Hypertensive chronic kidney disease with stage 1 through stage 4 chronic kidney disease, or unspecified chronic kidney disease: Secondary | ICD-10-CM | POA: Diagnosis present

## 2022-04-06 DIAGNOSIS — I9589 Other hypotension: Secondary | ICD-10-CM | POA: Diagnosis not present

## 2022-04-06 DIAGNOSIS — J449 Chronic obstructive pulmonary disease, unspecified: Secondary | ICD-10-CM | POA: Diagnosis not present

## 2022-04-06 DIAGNOSIS — J121 Respiratory syncytial virus pneumonia: Secondary | ICD-10-CM | POA: Diagnosis present

## 2022-04-06 DIAGNOSIS — G4733 Obstructive sleep apnea (adult) (pediatric): Secondary | ICD-10-CM | POA: Diagnosis present

## 2022-04-06 DIAGNOSIS — E869 Volume depletion, unspecified: Secondary | ICD-10-CM | POA: Diagnosis present

## 2022-04-06 LAB — TSH: TSH: 0.258 u[IU]/mL — ABNORMAL LOW (ref 0.350–4.500)

## 2022-04-06 LAB — GLUCOSE, CAPILLARY
Glucose-Capillary: 346 mg/dL — ABNORMAL HIGH (ref 70–99)
Glucose-Capillary: 321 mg/dL — ABNORMAL HIGH (ref 70–99)
Glucose-Capillary: 237 mg/dL — ABNORMAL HIGH (ref 70–99)
Glucose-Capillary: 329 mg/dL — ABNORMAL HIGH (ref 70–99)

## 2022-04-06 LAB — ECHOCARDIOGRAM COMPLETE
Area-P 1/2: 4.04 cm2
Calc EF: 57.9 %
Height: 69 in
S' Lateral: 2.8 cm
Single Plane A2C EF: 59.2 %
Single Plane A4C EF: 56.3 %
Weight: 4480 oz

## 2022-04-06 MED ORDER — LACTATED RINGERS IV SOLN: INTRAVENOUS | Status: DC

## 2022-04-06 MED ORDER — ORAL CARE MOUTH RINSE: 15.0000 mL | OROMUCOSAL | Status: DC | PRN

## 2022-04-06 NOTE — Hospital Course (Addendum)
Patrick Rocha is a 77 y.o. male with medical history significant of hypertension, atrial fibrillation on Xarelto, type 2 diabetes mellitus, COPD, CKD3b, OSA on CPAP, brain mass with recent biopsy who was brought into ED for hypotension and syncope while he was trying to get up from toilet after a bowel movement he felt lightheaded and dizzy and pale and passed out.  He had been having diarrhea for at least a week.  Trying to stay hydrated at home but EMS noted the initial blood pressure of BP 60/40 and received IV fluid. He was admitted at the end of January also with hypotension and had AKI with creatinine of 3 at the time improved with fluid resuscitation.  Of note patient recently  had elective stereotactic right frontal biopsy of lesion on 03/30/2022 with neurosurgery. In the ED, BP have normalized to 115/73. No true orthostatic vital signs but feels symptomatic with standing.  Did not have leukocytosis.  Hemoglobin was around baseline at 10.5.  Chemistry showed creatinine elevated at 1.9 with baseline between 1.7-1.9.  Magnesium was low at 1.6.  EKG showed A-fib with RBBB.  Patient was then considered for admission to hospital for further evaluation and treatment.  Assessment and plan.  Hypotension Likely secondary to volume depletion from ongoing diarrhea.  Initial systolic blood pressure in the 60s improved with IV fluid.  No orthostatic hypotension but was symptomatic with positional dizziness.  CT head was negative for acute findings status post recent brain biopsy.  RSV positive but patient was also having diarrhea.  Check 2D echocardiogram TSH low at 0.2.  Continue telemetry.  Urinalysis was negative.  BNP 110.  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  Continue dexamethasone.   Stage 3b chronic kidney disease (CKD) (HCC) - baseline SCr 1.7-1.9 -stable around baseline with creatinine of 1.9   OSA on CPAP Continue nocturnal  CPAP.   Persistent atrial fibrillation (HCC) -s/p ablation.  Patient recently had brain biopsy in Xarelto on hold until 04/08/22 per neurosurgery    COPD (chronic obstructive pulmonary disease) ([facility]) -stable. Not in exacerbation.   Controlled type 2 diabetes mellitus with stage 3 chronic kidney disease, without long-term current use of insulin (HCC) Continue sliding scale insulin while on steroid taper.   RSV (respiratory syncytial virus pneumonia) Continue symptomatic treatment.  No hypoxia.   Hypothyroidism Continue Synthroid.

## 2022-04-06 NOTE — Progress Notes (Signed)
Echocardiogram 2D Echocardiogram has been performed.  Patrick Rocha 04/06/2022, 3:10 PM

## 2022-04-06 NOTE — Progress Notes (Signed)
Spoke with patient about nocturnal CPAP. He declines use tonight due to not having his home apparatus. He states he does not tolerate any style of mask and prefers to use the pillows from home. He agrees to have someone bring his home pillows to the hospital if the plan is for him to stay for more than just tonight. RT will continue to follow.

## 2022-04-06 NOTE — Evaluation (Signed)
Physical Therapy Evaluation Patient Details Name: Patrick Rocha MRN: [id number] DOB: Apr 05, 1945 Today's Date: 04/06/2022  History of Present Illness  Patrick Rocha is a 77 y.o. male admitted with hypotension; positive for RSV. CT head negative for acute findings s/p recent brain biopsy. PMH: s/p Stereotactic right frontal brain biopsy 03/29/22, HTN, afib, diabetes, COPD, CKD, GERD, hypothyroidism, OSA on CPAP  Clinical Impression  Pt admitted with above diagnosis. Pt reports being ind at baseline, but also reports recently got RW and uses it in the home, also reports 4 falls in the last 6 months. Pt reports recently had surgery and went to SNF at discharge, went home and couldn't do much and ended up back at the hospital. Pt currently needing heavy moderate assist to sit up to EOB, pulling on bedrail. Pt also needing mod A to lift BLE back into bed. Pt powering to stand from elevated EOB, mod A to power up, hip extension last, noted to hold breath and educated to breath out and avoid breath holding. Pt tolerates standing EOB for 3 minutes with constant, unchanging dizziness, BP 110/71, SpO2 99% on RA and HR 90. Pt unable to take steps in room or pivot to recliner, reports low energy frequently during session. Recommend ST SNF prior to return home alone with 3 steps to enter. Pt currently with functional limitations due to the deficits listed below (see PT Problem List). Pt will benefit from skilled PT to increase their independence and safety with mobility to allow discharge to the venue listed below.          Recommendations for follow up therapy are one component of a multi-disciplinary discharge planning process, led by the attending physician.  Recommendations may be updated based on patient status, additional functional criteria and insurance authorization.  Follow Up Recommendations Skilled nursing-short term rehab (<3 hours/day) Can patient physically be transported by  private vehicle: No    Assistance Recommended at Discharge Frequent or constant Supervision/Assistance  Patient can return home with the following  A lot of help with walking and/or transfers;A lot of help with bathing/dressing/bathroom;Assistance with cooking/housework;Assist for transportation;Help with stairs or ramp for entrance    Equipment Recommendations None recommended by PT  Recommendations for Other Services       Functional Status Assessment Patient has had a recent decline in their functional status and demonstrates the ability to make significant improvements in function in a reasonable and predictable amount of time.     Precautions / Restrictions Precautions Precautions: Fall Precaution Comments: monitor BP Restrictions Weight Bearing Restrictions: No      Mobility  Bed Mobility Overal bed mobility: Needs Assistance Bed Mobility: Supine to Sit, Sit to Supine  Supine to sit: Mod assist Sit to supine: Mod assist  General bed mobility comments: pt pulling on bedrail, able to mobilize BLE to EOB, ultimately needing mod A to power up to sitting EOB; mod A to lift BLE Back into bed    Transfers Overall transfer level: Needs assistance Equipment used: Rolling walker (2 wheels) Transfers: Sit to/from Stand Sit to Stand: Mod assist, From elevated surface  General transfer comment: mod A to power up to standing, cues for hand placement, hip extension last, noted to hold breath with transfer, reports dizziness/lightheadedness upon standing (BP 110/71 after 3 minutes), unable to take step or pivot to recliner at bedside    Ambulation/Gait   Stairs   Wheelchair Mobility    Modified Rankin (Stroke Patients Only)  Balance Overall balance assessment: Needs assistance, History of Falls (pt reports 4 falls in last 6 months) Sitting-balance support: Feet supported Sitting balance-Leahy Scale: Good  Standing balance support: During functional activity Standing  balance-Leahy Scale: Fair Standing balance comment: static without UE support       Pertinent Vitals/Pain Pain Assessment Pain Assessment: No/denies pain    Home Living Family/patient expects to be discharged to:: Private residence Living Arrangements: Alone Available Help at Discharge: Family;Available PRN/intermittently Type of Home: House Home Access: Stairs to enter Entrance Stairs-Rails: Left Entrance Stairs-Number of Steps: 3   Home Layout: One level Home Equipment: Conservation officer, nature (2 wheels) Additional Comments: pt reports going to rehab after last hospital admission then got home and got progressively weaker and ended up back in hospital    Prior Function Prior Level of Function : Independent/Modified Independent   Mobility Comments: pt reports using RW in the home ADLs Comments: pt reports ind     Hand Dominance   Dominant Hand: Right    Extremity/Trunk Assessment   Upper Extremity Assessment Upper Extremity Assessment: Defer to OT evaluation    Lower Extremity Assessment Lower Extremity Assessment: Generalized weakness (AROM WFL, strength grossly 3+/5, lower legs wrapped, reports numbness/tingling from knees down bilaterally)    Cervical / Trunk Assessment Cervical / Trunk Assessment: Normal  Communication   Communication: No difficulties  Cognition Arousal/Alertness: Awake/alert Behavior During Therapy: Flat affect Overall Cognitive Status: No family/caregiver present to determine baseline cognitive functioning  General Comments: pt very flat, reports "I have no energy" multiple times during eval, denies pain and SOB, able to follow commands        General Comments General comments (skin integrity, edema, etc.): VSS    Exercises     Assessment/Plan    PT Assessment Patient needs continued PT services  PT Problem List Decreased strength;Decreased activity tolerance;Decreased mobility;Decreased knowledge of use of DME;Decreased safety  awareness;Decreased balance;Impaired sensation;Obesity       PT Treatment Interventions DME instruction;Gait training;Functional mobility training;Therapeutic activities;Therapeutic exercise;Balance training;Neuromuscular re-education;Patient/family education    PT Goals (Current goals can be found in the Care Plan section)  Acute Rehab PT Goals Patient Stated Goal: "I have no energy" PT Goal Formulation: With patient Time For Goal Achievement: 04/20/22 Potential to Achieve Goals: Good    Frequency Min 3X/week     Co-evaluation               AM-PAC PT "6 Clicks" Mobility  Outcome Measure Help needed turning from your back to your side while in a flat bed without using bedrails?: A Lot Help needed moving from lying on your back to sitting on the side of a flat bed without using bedrails?: A Lot Help needed moving to and from a bed to a chair (including a wheelchair)?: A Lot Help needed standing up from a chair using your arms (e.g., wheelchair or bedside chair)?: A Lot Help needed to walk in hospital room?: Total Help needed climbing 3-5 steps with a railing? : Total 6 Click Score: 10    End of Session Equipment Utilized During Treatment: Gait belt Activity Tolerance: Patient tolerated treatment well;Patient limited by fatigue Patient left: in bed;with call bell/phone within reach;with bed alarm set Nurse Communication: Mobility status PT Visit Diagnosis: Other abnormalities of gait and mobility (R26.89);Muscle weakness (generalized) (M62.81)    Time: [id number] PT Time Calculation (min) (ACUTE ONLY): 37 min   Charges:   PT Evaluation $PT Eval Moderate Complexity: 1 Mod PT Treatments $Therapeutic Activity: 8-22  mins         Talbot Grumbling PT, DPT 04/06/22, 10:43 AM

## 2022-04-06 NOTE — Inpatient Diabetes Management (Signed)
Inpatient Diabetes Program Recommendations  AACE/ADA: New Consensus Statement on Inpatient Glycemic Control  Target Ranges:  Prepandial:   less than 140 mg/dL      Peak postprandial:   less than 180 mg/dL (1-2 hours)      Critically ill patients:  140 - 180 mg/dL    Latest Reference Range & Units 04/06/22 07:25  Glucose-Capillary 70 - 99 mg/dL 346 (H)    Latest Reference Range & Units 04/05/22 18:34  Glucose 70 - 99 mg/dL 141 (H)   Review of Glycemic Control  Diabetes history: DM2 Outpatient Diabetes medications: Lantus 40 units daily as needed, Metformin 1000 mg BID, Actos 15 mg daily, Glipizide 10 mg daily, Novolog 4 units TID with meals (not taking), Decadron taper Current orders for Inpatient glycemic control: Novolog 0-9 units TID with meals; Decadron 2 mg daily  Inpatient Diabetes Program Recommendations:    Insulin: Please consider ordering Levemir 5 units BID and Novolog 0-5 units QHS for bedtime correction.  Thanks, Barnie Alderman, RN, MSN, [facility] Diabetes Coordinator Inpatient Diabetes Program [phone] (Team Pager from 8am to [place])

## 2022-04-06 NOTE — Progress Notes (Signed)
PROGRESS NOTE    Patrick Rocha  [id number] DOB: 07-10-1945 DOA: 04/05/2022 PCP: Elliot Dally, MD    Brief Narrative:  Patrick Rocha is a 77 y.o. male with medical history significant of hypertension, atrial fibrillation on Xarelto, type 2 diabetes mellitus, COPD, CKD3b, OSA on CPAP, brain mass with recent biopsy who was brought into ED for hypotension and syncope while he was trying to get up from toilet after a bowel movement he felt lightheaded and dizzy and pale and passed out.  He had been having diarrhea for at least a week.  Trying to stay hydrated at home but EMS noted the initial blood pressure of BP 60/40 and received IV fluid. He was admitted at the end of January also with hypotension and had AKI with creatinine of 3 at the time improved with fluid resuscitation.  Of note patient recently  had elective stereotactic right frontal biopsy of lesion on 03/30/2022 with neurosurgery. In the ED, BP have normalized to 115/73. No true orthostatic vital signs but feels symptomatic with standing.  Did not have leukocytosis.  Hemoglobin was around baseline at 10.5.  Chemistry showed creatinine elevated at 1.9 with baseline between 1.7-1.9.  Magnesium was low at 1.6.  EKG showed A-fib with RBBB.  Patient was then considered for admission to hospital for further evaluation and treatment.  Assessment and plan.  Hypotension Likely secondary to volume depletion from ongoing diarrhea.  Initial systolic blood pressure in the 60s improved with IV fluid.  No orthostatic hypotension but was symptomatic with positional dizziness.  CT head was negative for acute findings status post recent brain biopsy.  RSV positive but patient was also having diarrhea.  Check 2D echocardiogram TSH low at 0.2.  Continue telemetry.  Urinalysis was negative.  BNP 110.  On gentle IV fluids.  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  Continue  dexamethasone.   Stage 3b chronic kidney disease (CKD) (HCC) - baseline SCr 1.7-1.9 -stable around baseline with creatinine of 1.9   OSA on CPAP Continue nocturnal CPAP.   Persistent atrial fibrillation (HCC) -s/p ablation.  Patient recently had brain biopsy in Xarelto on hold until 04/08/22 per neurosurgery    COPD (chronic obstructive pulmonary disease) ([place]) -stable. Not in exacerbation.   Controlled type 2 diabetes mellitus with stage 3 chronic kidney disease, without long-term current use of insulin (HCC) Continue sliding scale insulin while on steroid taper.  Continue to monitor closely, diabetic diet Accu-Cheks.   RSV (respiratory syncytial virus pneumonia) Continue symptomatic treatment.  No hypoxia.   Hypothyroidism Continue Synthroid.  TSH suppressed at 0.2.  States that he is on the same dose of Synthroid for a while.  Might need to check thyroid function as outpatient.   Deconditioning, debility, falls. Patient was seen by physical therapy and physical therapy recommended skilled nursing facility placement.  Patient did have 4 falls in the last 6 months.  Needed a lot of assistance while in the hospital.   DVT prophylaxis: SCDs Start: 04/05/22 2253   Code Status:     Code Status: Full Code  Disposition: Skilled nursing facility.  Status is: Observation  The patient will require care spanning > 2 midnights and should be moved to inpatient because: IV fluids, profound debility/deconditioning, need for rehabilitation at skilled nursing facility,   Family Communication: Spoke with the patient's son on the phone and updated him about the clinical condition of the patient.  Patient's son wishing skilled nursing  facility rehabilitation, will consult TOC.  Consultants:  None at this time  Procedures:  None  Antimicrobials:  None  Anti-infectives (From admission, onward)    None      Subjective: Today, patient was seen and examined at bedside.  Patient states  that EEG headache has been little less today.  Denies any nausea vomiting.  Has not had a bowel movement since he came in.  He was dizzy when he tried to do something though.  Physical therapy however noted to be him to be extremely weak and deconditioned.  Objective: Vitals:   04/05/22 2130 04/05/22 2321 04/06/22 0435 04/06/22 0837  BP: 115/66 123/73 131/71 131/69  Pulse: 79 79 85 87  Resp: '11 16 16 17  '$ Temp:  97.8 F (36.6 C) (!) 97.5 F (36.4 C) 97.8 F (36.6 C)  TempSrc:  Oral Oral Oral  SpO2: 98% 98% 100% 100%  Weight:      Height:        Intake/Output Summary (Last 24 hours) at 04/06/2022 1051 Last data filed at 04/06/2022 0800 Gross per 24 hour  Intake 787.77 ml  Output 350 ml  Net 437.77 ml   Filed Weights   04/05/22 1725 04/05/22 1729  Weight: 127 kg 127 kg    Physical Examination: Body mass index is 41.35 kg/m.   General: Obese built, not in obvious distress, appears weak and deconditioned, HENT:   No scleral pallor or icterus noted. Oral mucosa is moist.  Right scalp area with staples. Chest:  Clear breath sounds.  Diminished breath sounds bilaterally. No crackles or wheezes.  CVS: S1 &S2 heard. No murmur.  Regular rate and rhythm. Abdomen: Soft, nontender,   Obese abdomen.  Bowel sounds are heard.   Extremities: Bilateral lower extremity wrapped with compression dressings. Psych: Alert, awake and oriented, normal mood CNS:  No cranial nerve deficits.  Generalized weakness noted.   Skin: Warm and dry.  No rashes noted.Bilateral lower extremity wrapped with compression dressings.   Data Reviewed:   CBC: Recent Labs  Lab 04/05/22 1834  WBC 6.3  HGB 10.5*  HCT 32.0*  MCV 98.8  PLT 128*    Basic Metabolic Panel: Recent Labs  Lab 04/05/22 1834  NA 139  K 4.3  CL 111  CO2 23  GLUCOSE 141*  BUN 55*  CREATININE 1.92*  CALCIUM 7.5*  MG 1.6*    Liver Function Tests: No results for input(s): "AST", "ALT", "ALKPHOS", "BILITOT", "PROT", "ALBUMIN"  in the last 168 hours.   Radiology Studies: CT Head Wo Contrast  Result Date: 04/05/2022 CLINICAL DATA:  Altered mental status, presyncope, status post brain biopsy 1 week prior. EXAM: CT HEAD WITHOUT CONTRAST TECHNIQUE: Contiguous axial images were obtained from the base of the skull through the vertex without intravenous contrast. RADIATION DOSE REDUCTION: This exam was performed according to the departmental dose-optimization program which includes automated exposure control, adjustment of the mA and/or kV according to patient size and/or use of iterative reconstruction technique. COMPARISON:  03/29/2022 FINDINGS: Brain: Moderate parenchymal volume loss is again seen, stable since prior examination. 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 Punctate foci of pneumocephalus related to biopsy on 03/29/2022 have resolved in the interval. No superimposed acute intracranial hemorrhage. No abnormal mass effect or midline shift. Mild periventricular white matter changes are present likely reflecting the sequela of small vessel ischemia. Ventricular size is normal. Cerebellum  is unremarkable. No acute infarct Vascular: No hyperdense vessel or unexpected calcification. Skull: Normal. Negative for fracture or focal lesion. Sinuses/Orbits: Extensive mucosal thickening is seen throughout the visualized paranasal sinuses with near complete opacification of the visualized left maxillary sinus and layering fluid within the right maxillary sinus. This appears mildly progressive since prior examination. Orbits are unremarkable. Other: Mastoid air cells and middle ear cavities are clear. Surgical skin staples noted within the right frontal scalp overlying craniotomy defect. IMPRESSION: 1. Stable hypodensity within the inferior right frontal lobe corresponding to the abnormal FLAIR  signal suggestive of an infiltrative glioma on MRI examination of 01/29/2022. No superimposed acute intracranial hemorrhage. No abnormal mass effect or midline shift. 2. Progressive paranasal sinus disease. Electronically Signed   By: Fidela Salisbury M.D.   On: 04/05/2022 22:18   DG Chest Portable 1 View  Result Date: 04/05/2022 CLINICAL DATA:  Syncope. EXAM: PORTABLE CHEST 1 VIEW COMPARISON:  February 27, 2022. FINDINGS: The heart size and mediastinal contours are within normal limits. Both lungs are clear. The visualized skeletal structures are unremarkable. IMPRESSION: No active disease. Electronically Signed   By: Marijo Conception M.D.   On: 04/05/2022 18:07      LOS: 0 days    Flora Lipps, MD [facility] Available via Epic secure chat 7am-7pm After these hours, please refer to coverage provider listed on [email or web address] 04/06/2022, 10:51 AM

## 2022-04-07 DIAGNOSIS — I9589 Other hypotension: Secondary | ICD-10-CM | POA: Diagnosis not present

## 2022-04-07 DIAGNOSIS — J449 Chronic obstructive pulmonary disease, unspecified: Secondary | ICD-10-CM | POA: Diagnosis not present

## 2022-04-07 DIAGNOSIS — G9389 Other specified disorders of brain: Secondary | ICD-10-CM | POA: Diagnosis not present

## 2022-04-07 DIAGNOSIS — E1122 Type 2 diabetes mellitus with diabetic chronic kidney disease: Secondary | ICD-10-CM | POA: Diagnosis not present

## 2022-04-07 LAB — GLUCOSE, CAPILLARY
Glucose-Capillary: 366 mg/dL — ABNORMAL HIGH (ref 70–99)
Glucose-Capillary: 370 mg/dL — ABNORMAL HIGH (ref 70–99)
Glucose-Capillary: 233 mg/dL — ABNORMAL HIGH (ref 70–99)
Glucose-Capillary: 237 mg/dL — ABNORMAL HIGH (ref 70–99)

## 2022-04-07 LAB — BASIC METABOLIC PANEL
Anion gap: 5 (ref 5–15)
BUN: 42 mg/dL — ABNORMAL HIGH (ref 8–23)
CO2: 23 mmol/L (ref 22–32)
Calcium: 7.7 mg/dL — ABNORMAL LOW (ref 8.9–10.3)
Chloride: 104 mmol/L (ref 98–111)
Creatinine, Ser: 1.73 mg/dL — ABNORMAL HIGH (ref 0.61–1.24)
GFR, Estimated: 41 mL/min — ABNORMAL LOW (ref >60)
Glucose, Bld: 399 mg/dL — ABNORMAL HIGH (ref 70–99)
Potassium: 4.9 mmol/L (ref 3.5–5.1)
Sodium: 132 mmol/L — ABNORMAL LOW (ref 135–145)

## 2022-04-07 LAB — CBC
HCT: 29.3 % — ABNORMAL LOW (ref 39.0–52.0)
Hemoglobin: 9.6 g/dL — ABNORMAL LOW (ref 13.0–17.0)
MCH: 32 pg (ref 26.0–34.0)
MCHC: 32.8 g/dL (ref 30.0–36.0)
MCV: 97.7 fL (ref 80.0–100.0)
Platelets: 108 10*3/uL — ABNORMAL LOW (ref 150–400)
RBC: 3 MIL/uL — ABNORMAL LOW (ref 4.22–5.81)
RDW: 15.4 % (ref 11.5–15.5)
WBC: 5.1 10*3/uL (ref 4.0–10.5)
nRBC: 0.4 % — ABNORMAL HIGH (ref 0.0–0.2)

## 2022-04-07 LAB — MAGNESIUM: Magnesium: 1.7 mg/dL (ref 1.7–2.4)

## 2022-04-07 MED ORDER — INSULIN ASPART 100 UNIT/ML IJ SOLN: 0.0000 [IU] | Freq: Three times a day (TID) | INTRAMUSCULAR | Status: DC

## 2022-04-07 MED ORDER — ACETAMINOPHEN 325 MG PO TABS
650.0000 mg | ORAL_TABLET | Freq: Four times a day (QID) | ORAL | Status: DC | PRN
  Administered 2022-04-08 – 2022-04-09 (×2): 650 mg via ORAL
  Filled 2022-04-07 (×2): qty 2

## 2022-04-07 MED ORDER — INSULIN DETEMIR 100 UNIT/ML ~~LOC~~ SOLN
5.0000 [IU] | Freq: Two times a day (BID) | SUBCUTANEOUS | Status: DC
  Administered 2022-04-07 – 2022-04-09 (×5): 5 [IU] via SUBCUTANEOUS
  Filled 2022-04-07 (×6): qty 0.05

## 2022-04-07 MED ORDER — DEXAMETHASONE 2 MG PO TABS
2.0000 mg | ORAL_TABLET | ORAL | Status: DC
  Administered 2022-04-08: 2 mg via ORAL
  Filled 2022-04-07 (×2): qty 1

## 2022-04-07 MED ORDER — DEXAMETHASONE 2 MG PO TABS: 2.0000 mg | ORAL_TABLET | ORAL | Status: DC

## 2022-04-07 MED ORDER — INSULIN ASPART 100 UNIT/ML IJ SOLN
0.0000 [IU] | Freq: Every day | INTRAMUSCULAR | Status: DC
  Administered 2022-04-07: 2 [IU] via SUBCUTANEOUS
  Administered 2022-04-08: 4 [IU] via SUBCUTANEOUS

## 2022-04-07 NOTE — Evaluation (Signed)
Occupational Therapy Evaluation Patient Details Name: Patrick Rocha MRN: [id number] DOB: [birth year] Today's Date: 04/07/2022   History of Present Illness Patrick Rocha is a 77 y.o. male admitted with hypotension; positive for RSV. CT head negative for acute findings s/p recent brain biopsy. PMH: s/p Stereotactic right frontal brain biopsy 03/29/22, HTN, afib, diabetes, COPD, CKD, GERD, hypothyroidism, OSA on CPAP   Clinical Impression   Patrick Rocha is a 77 year old man admitted to hospital with above medical history and presents with generalized weakness, decreased activity tolerance, and impaired balance. He is complaining of dizziness with standing and ambulation though BP is not low nor did he have an orthostatic drop. His HR was up to 138 with activity. Patient needing min guard to ambulate with walker and increased assistance with ADLs including max assist for LB ADLs. He needed to sit down after standing at the sink. He +2 for safety. He reports after leaving rehab recently he was able to ambulate around the building and perform his ADLs. Patient will benefit from skilled OT services while in hospital to improve deficits and learn compensatory strategies as needed in order to return to PLOF.         Recommendations for follow up therapy are one component of a multi-disciplinary discharge planning process, led by the attending physician.  Recommendations may be updated based on patient status, additional functional criteria and insurance authorization.   Follow Up Recommendations  Skilled nursing-short term rehab (<3 hours/day)     Assistance Recommended at Discharge Frequent or constant Supervision/Assistance  Patient can return home with the following A little help with walking and/or transfers;A lot of help with bathing/dressing/bathroom;Assistance with cooking/housework;Help with stairs or ramp for entrance    Functional Status Assessment  Patient has had  a recent decline in their functional status and demonstrates the ability to make significant improvements in function in a reasonable and predictable amount of time.  Equipment Recommendations  None recommended by OT    Recommendations for Other Services       Precautions / Restrictions Precautions Precautions: Fall Precaution Comments: monitor , dizzy with ambulation Restrictions Weight Bearing Restrictions: No      Mobility Bed Mobility Overal bed mobility: Needs Assistance Bed Mobility: Supine to Sit     Supine to sit: Supervision     General bed mobility comments: Increased time and use of bed rail but no physical assist    Transfers Overall transfer level: Needs assistance Equipment used: Rolling walker (2 wheels) Transfers: Sit to/from Stand Sit to Stand: Min guard, Mod assist           General transfer comment: Patient min guard to stand from elevated bed but mod assist to rise from toilet height. MIn guard to ambulate to bathroom. patient reported dizziness. BPs monitored but he was no hypotensive or orthostatic.      Balance Overall balance assessment: Needs assistance, History of Falls   Sitting balance-Leahy Scale: Good     Standing balance support: During functional activity, Reliant on assistive device for balance Standing balance-Leahy Scale: Fair Standing balance comment: static without UE support                           ADL either performed or assessed with clinical judgement   ADL Overall ADL's : Needs assistance/impaired Eating/Feeding: Independent   Grooming: Wash/dry hands;Standing Grooming Details (indicate cue type and reason): at sink, became dizzy and needed to ist  back down on toilet Upper Body Bathing: Set up;Sitting   Lower Body Bathing: Moderate assistance;Sit to/from stand   Upper Body Dressing : Set up;Sitting   Lower Body Dressing: Moderate assistance;Sit to/from stand   Toilet Transfer: Ambulation;Regular  Toilet;Rolling walker (2 wheels);Moderate assistance Toilet Transfer Details (indicate cue type and reason): mod assist to power up from [place] and Hygiene: Min guard;Sitting/lateral lean       Functional mobility during ADLs: Min guard;Rolling walker (2 wheels);+2 for safety/equipment       Vision Patient Visual Report: No change from baseline       Perception     Praxis      Pertinent Vitals/Pain Pain Assessment Pain Assessment: No/denies pain     Hand Dominance Right   Extremity/Trunk Assessment Upper Extremity Assessment Upper Extremity Assessment: Overall WFL for tasks assessed   Lower Extremity Assessment Lower Extremity Assessment: Defer to PT evaluation   Cervical / Trunk Assessment Cervical / Trunk Exceptions: body habitus   Communication Communication Communication: No difficulties   Cognition Arousal/Alertness: Awake/alert Behavior During Therapy: Flat affect Overall Cognitive Status: Within Functional Limits for tasks assessed                                       General Comments       Exercises     Shoulder Instructions      Home Living Family/patient expects to be discharged to:: Private residence Living Arrangements: Alone Available Help at Discharge: Family;Available PRN/intermittently Type of Home: House Home Access: Stairs to enter [employer] of Steps: 3 Entrance Stairs-Rails: Left Home Layout: One level     Bathroom Shower/Tub: Walk-in shower         Home Equipment: Conservation officer, nature (2 wheels)   Additional Comments: pt reports going to rehab after last hospital admission then got home and got progressively weaker and ended up back in hospital      Prior Functioning/Environment Prior Level of Function : Independent/Modified Independent             Mobility Comments: pt reports using RW in the home ADLs Comments: pt reports ind        OT Problem List:  Decreased strength;Decreased activity tolerance;Cardiopulmonary status limiting activity;Obesity;Increased edema;Decreased knowledge of use of DME or AE      OT Treatment/Interventions: Self-care/ADL training;Therapeutic exercise;DME and/or AE instruction;Therapeutic activities;Balance training;Patient/family education    OT Goals(Current goals can be found in the care plan section) Acute Rehab OT Goals Patient Stated Goal: to feel stronger OT Goal Formulation: With patient Time For Goal Achievement: 04/21/22 Potential to Achieve Goals: Fair  OT Frequency: Min 2X/week    Co-evaluation              AM-PAC OT "6 Clicks" Daily Activity     Outcome Measure Help from another person eating meals?: None Help from another person taking care of personal grooming?: A Little Help from another person toileting, which includes using toliet, bedpan, or urinal?: A Little Help from another person bathing (including washing, rinsing, drying)?: A Lot Help from another person to put on and taking off regular upper body clothing?: A Little Help from another person to put on and taking off regular lower body clothing?: A Lot 6 Click Score: 17   End of Session Equipment Utilized During Treatment: Rolling walker (2 wheels) Nurse Communication: Mobility status  Activity Tolerance: Patient limited by  fatigue Patient left: in chair;with call bell/phone within reach;with chair alarm set  OT Visit Diagnosis: Muscle weakness (generalized) (M62.81);Dizziness and giddiness (R42)                Time: [id number] OT Time Calculation (min): 27 min Charges:  OT General Charges $OT Visit: 1 Visit OT Evaluation $OT Eval Low Complexity: 1 Low OT Treatments $Self Care/Home Management : 8-22 mins  Gustavo Lah, OTR/L [place]  Office [phone]   Lenward Chancellor 04/07/2022, 12:37 PM

## 2022-04-07 NOTE — NC FL2 (Signed)
[place] LEVEL OF CARE FORM     IDENTIFICATION  Patient Name: Patrick Rocha Birthdate: 1945/04/23 Sex: male Admission Date (Current Location): 04/05/2022  [facility] [place] and [place] Number:  Herbalist and Address:  [facility],  [place] [address] [place]      Provider Number: [id number]  Attending Physician Name and Address:  Flora Lipps, MD  Relative Name and Phone Number:  Dian Situ [phone] [facility])    Current Level of Care: SNF Recommended Level of Care: [place] Prior Approval Number:    Date Approved/Denied:   PASRR Number: [id number]  Discharge Plan: SNF    Current Diagnoses: Patient Active Problem List   Diagnosis Date Noted   RSV (respiratory syncytial virus pneumonia) 04/05/2022   Acute renal failure ([place]) 02/28/2022   Paroxysmal atrial fibrillation ([place]) 02/28/2022   Hypothyroidism 02/28/2022   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/2022   Type 2 diabetes mellitus with chronic kidney disease, with long-term current use of insulin ([place]) [id number]   Acute metabolic encephalopathy [id number]   Urinary tract infection without hematuria 02/28/2022   Hypotension 02/27/2022   Dehydration 02/27/2022   Brain mass 01/29/2022   Morbid (severe) obesity due to excess calories ([place]) 01/29/2022   Hypertensive emergency 01/29/2022   Vertigo 01/29/2022   Stage 3b chronic kidney disease (CKD) ([place]) - baseline SCr 1.7-1.9 01/29/2022   COPD (chronic obstructive pulmonary disease) ([place]) 01/16/2018   Left renal mass 03/18/2016   OSA on CPAP 12/10/2015   Benign prostatic hyperplasia 10/20/2010   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   Controlled type 2 diabetes mellitus with stage 3 chronic kidney disease, without long-term current use of insulin ([place]) 07/20/2010   Hypertension 07/20/2010   Persistent atrial fibrillation ([place]) 07/20/2010   Esophageal reflux 12/06/2006    Orientation RESPIRATION  BLADDER Height & Weight     Self, Time, Situation, Place  Normal Continent Weight: 280 lb (127 kg) Height:  '5\' 9"'$  (175.3 cm)  BEHAVIORAL SYMPTOMS/MOOD NEUROLOGICAL BOWEL NUTRITION STATUS      Continent Diet (Heart Healthy)  AMBULATORY STATUS COMMUNICATION OF NEEDS Skin   Extensive Assist Verbally Normal                       Personal Care Assistance Level of Assistance  Bathing, Feeding, Dressing Bathing Assistance: Limited assistance Feeding assistance: Independent Dressing Assistance: Limited assistance     Functional Limitations Info  Sight, Hearing, Speech Sight Info: Adequate Hearing Info: Adequate Speech Info: Adequate    SPECIAL CARE FACTORS FREQUENCY  PT (By licensed PT), OT (By licensed OT)     PT Frequency: 5 x a week OT Frequency: 5 x a week            Contractures Contractures Info: Not present    Additional Factors Info  Code Status, Allergies Code Status Info: full Allergies Info: NKA           Current Medications (04/07/2022):  This is the current hospital active medication list Current Facility-Administered Medications  Medication Dose Route Frequency Provider Last Rate Last Admin   acetaminophen (TYLENOL) tablet 650 mg  650 mg Oral Q6H PRN Pokhrel, Laxman, MD       atorvastatin (LIPITOR) tablet 40 mg  40 mg Oral Daily Tu, Ching T, DO   40 mg at 04/07/22 1116   [START ON 04/08/2022] dexamethasone (DECADRON) tablet 2 mg  2 mg Oral QODAY Pokhrel, Laxman, MD       diphenoxylate-atropine (LOMOTIL) 2.5-0.025 MG  per tablet 1 tablet  1 tablet Oral QID PRN Tu, Ching T, DO       escitalopram (LEXAPRO) tablet 10 mg  10 mg Oral Daily Tu, Ching T, DO   10 mg at 04/07/22 1117   ferrous sulfate tablet 325 mg  325 mg Oral BID WC Tu, Ching T, DO   325 mg at 04/07/22 0825   insulin aspart (novoLOG) injection 0-5 Units  0-5 Units Subcutaneous QHS Pokhrel, Laxman, MD       insulin aspart (novoLOG) injection 0-9 Units  0-9 Units Subcutaneous TID WC Tu, Ching T,  DO   7 Units at 04/07/22 [id number]   insulin detemir (LEVEMIR) injection 5 Units  5 Units Subcutaneous BID Pokhrel, Laxman, MD       levothyroxine (SYNTHROID) tablet 150 mcg  150 mcg Oral QAC breakfast Tu, Ching T, DO   150 mcg at 04/07/22 [id number]   Oral care mouth rinse  15 mL Mouth Rinse PRN Pokhrel, Laxman, MD       pantoprazole (PROTONIX) EC tablet 40 mg  40 mg Oral BID Tu, Ching T, DO   40 mg at 04/07/22 1116   tamsulosin (FLOMAX) capsule 0.4 mg  0.4 mg Oral Daily Tu, Ching T, DO   0.4 mg at 04/07/22 1117     Discharge Medications: Please see discharge summary for a list of discharge medications.  Relevant Imaging Results:  Relevant Lab Results:   Additional Information SSN [id number]  Rayne, LCSW

## 2022-04-07 NOTE — Progress Notes (Signed)
Orthopedic Tech Progress Note Patient Details:  Patrick Rocha Oct 09, 1945 [id number]  Patient ID: Patrick Rocha, male   DOB: [birth year], 77 y.o.   MRN: [id number]  Patrick Rocha 04/07/2022, 1:31 PM Bi lat unna boot applied

## 2022-04-07 NOTE — Progress Notes (Addendum)
PROGRESS NOTE    Patrick Rocha  [id number] DOB: [birth year] DOA: 04/05/2022 PCP: Elliot Dally, MD    Brief Narrative:  Patrick Rocha is a 77 y.o. male with medical history significant of hypertension, atrial fibrillation on Xarelto, type 2 diabetes mellitus, COPD, CKD3b, OSA on CPAP, brain mass with recent biopsy who was brought into ED for hypotension and syncope while he was trying to get up from toilet after a bowel movement he felt lightheaded and dizzy and pale and passed out.  He had been having diarrhea for at least a week.  Trying to stay hydrated at home but EMS noted the initial blood pressure of BP 60/40 and received IV fluid. He was admitted at the end of January also with hypotension and had AKI with creatinine of 3 at the time improved with fluid resuscitation.  Of note, patient recently  had elective stereotactic right frontal biopsy of lesion on 03/30/2022 with neurosurgery. In the ED, BP have normalized to 115/73. No true orthostatic vital signs but feels symptomatic with standing.  Did not have leukocytosis.  Hemoglobin was around baseline at 10.5.  Chemistry showed creatinine elevated at 1.9 with baseline between 1.7-1.9.  Magnesium was low at 1.6.  EKG showed A-fib with RBBB.  Patient was then considered for admission to hospital for further evaluation and treatment.  Assessment and plan.  Hypotension Has improved.  Likely secondary to volume depletion from ongoing diarrhea.  Initial systolic blood pressure in the 60s improved with IV fluid.  No orthostatic hypotension but was symptomatic with positional dizziness.  Patient still complains of dizziness.  CT head was negative for acute findings status post recent brain biopsy.  RSV positive but patient was also having diarrhea. 2D echocardiogram preserved LV function at 55 to 60%.  TSH low at 0.2.  Continue telemetry.  Urinalysis was negative.  BNP 110.  On gentle IV fluids.  Will discontinue now since  patient is positive balance for 1229 mL at this time has been having lower extremity edema.  Will put the patient on compression stockings.  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  Continue dexamethasone.   Stage 3b chronic kidney disease (CKD) (HCC) - baseline SCr 1.7-1.9 -stable around baseline with creatinine of 1.9.  Creatinine today at 1.7.   OSA on CPAP Continue nocturnal CPAP.   Persistent atrial fibrillation (HCC) -s/p ablation.  Patient recently had brain biopsy in Xarelto on hold until 04/08/22 per neurosurgery    COPD (chronic obstructive pulmonary disease) ([place]) -stable. Not in exacerbation.  Type 2 diabetes mellitus with stage 3 chronic kidney disease, without long-term current use of insulin with hyperglycemia Continue sliding scale insulin while on steroid taper.  Continue to monitor closely, diabetic diet Accu-Cheks.  Continue to adjust insulin regimen.   RSV (respiratory syncytial virus pneumonia) Continue symptomatic treatment.  No hypoxia.   Hypothyroidism Continue Synthroid.  TSH suppressed at 0.2.  States that he is on the same dose of Synthroid for a while.  Might need to check thyroid function as outpatient.   Deconditioning, debility, falls. Patient was seen by physical therapy and physical therapy recommended skilled nursing facility placement.  Patient did have 4 falls in the last 6 months.  Needed a lot of assistance while in the hospital.  Bilateral lower extremity edema.  Patient was getting wraps at home.  Will consider for Unna boots.  Family states it is getting worse ever since his biopsy.  Possibility  of poor oral nutrition.  Encouraged ambulation, elevation of legs when at rest.  Will check CMP in AM.  Morbid obesity.Body mass index is 41.35 kg/m.  Would benefit from weight loss as outpatient.   DVT prophylaxis: SCDs Start: 04/05/22 2253   Code Status:     Code Status: Full  Code  Disposition: Skilled nursing facility likely in 1 to 2 days.  Status is: Inpatient  The patient is  inpatient because: profound debility/deconditioning, need for rehabilitation at skilled nursing facility,   Family Communication: Spoke with the patient's son in law at bedside.    Consultants:  None at this time  Procedures:  None  Antimicrobials:  None  Anti-infectives (From admission, onward)    None      Subjective:  Today, patient was seen and examined at bedside.  Patient feels little dizzy and lightheaded today.  No nausea vomiting fever or chills.  Patient's son-in-law at bedside as well.  Had lower extremity edema and had been wrapping at home.  Complains of mild headache.  Objective: Vitals:   04/06/22 2109 04/06/22 2150 04/06/22 2200 04/07/22 0424  BP:  (!) 159/107 133/87 138/72  Pulse: 97 (!) 107 90 97  Resp: '17 16  17  '$ Temp:  98.3 F (36.8 C)  97.7 F (36.5 C)  TempSrc:  Oral  Oral  SpO2:  100%  100%  Weight:      Height:        Intake/Output Summary (Last 24 hours) at 04/07/2022 1151 Last data filed at 04/07/2022 1129 Gross per 24 hour  Intake 2341.63 ml  Output 1350 ml  Net 991.63 ml    Filed Weights   04/05/22 1725 04/05/22 1729  Weight: 127 kg 127 kg    Physical Examination: Body mass index is 41.35 kg/m.   General: Obese built, not in obvious distress, appears weak and deconditioned.  Sitting up in the chair. HENT:   No scleral pallor or icterus noted. Oral mucosa is moist.  Right scalp area with staples. Chest:  Clear breath sounds.  Diminished breath sounds bilaterally. No crackles or wheezes.  CVS: S1 &S2 heard. No murmur.  Regular rate and rhythm. Abdomen: Soft, nontender,   Obese abdomen.  Bowel sounds are heard.   Extremities: Bilateral lower extremity edema noted, off compression Psych: Alert, awake and oriented, normal mood CNS:  No cranial nerve deficits.  Generalized weakness noted.   Skin: Warm and dry.  No rashes  noted.bilateral lower extremity edema.    Data Reviewed:   CBC: Recent Labs  Lab 04/05/22 1834 04/07/22 0408  WBC 6.3 5.1  HGB 10.5* 9.6*  HCT 32.0* 29.3*  MCV 98.8 97.7  PLT 128* 108*     Basic Metabolic Panel: Recent Labs  Lab 04/05/22 1834 04/07/22 0408  NA 139 132*  K 4.3 4.9  CL 111 104  CO2 23 23  GLUCOSE 141* 399*  BUN 55* 42*  CREATININE 1.92* 1.73*  CALCIUM 7.5* 7.7*  MG 1.6* 1.7     Liver Function Tests: No results for input(s): "AST", "ALT", "ALKPHOS", "BILITOT", "PROT", "ALBUMIN" in the last 168 hours.   Radiology Studies: ECHOCARDIOGRAM COMPLETE  Result Date: 04/06/2022    ECHOCARDIOGRAM REPORT   Patient Name:   JES[facility]ANGEL DICOC[facility]A [facility] Date of Exam: 04/06/2022 Medical Rec #:  [id number]               Height:       69.0 in Accession #:    [id number]  Weight:       280.0 lb Date of Birth:  [birth year]              BSA:          2.384 m Patient Age:    60 years                BP:           124/72 mmHg Patient Gender: M                       HR:           95 bpm. Exam Location:  Inpatient Procedure: 2D Echo, Cardiac Doppler and Color Doppler Indications:    Syncope  History:        Patient has no prior history of Echocardiogram examinations.                 Arrythmias:Atrial Fibrillation; Risk Factors:Hypertension.  Sonographer:    Phineas Douglas Referring Phys: [id number] Rexford T TU IMPRESSIONS  1. Left ventricular ejection fraction, by estimation, is 55 to 60%. The left ventricle has normal function. The left ventricle has no regional wall motion abnormalities. There is mild concentric left ventricular hypertrophy. Left ventricular diastolic parameters are indeterminate.  2. Right ventricular systolic function is normal. The right ventricular size is normal.  3. Left atrial size was mildly dilated.  4. Right atrial size was mildly dilated.  5. The mitral valve is normal in structure. Trivial mitral valve regurgitation. No evidence of mitral stenosis.  6. The  aortic valve is tricuspid. There is mild calcification of the aortic valve. Aortic valve regurgitation is trivial. No aortic stenosis is present.  7. The inferior vena cava is dilated in size with >50% respiratory variability, suggesting right atrial pressure of 8 mmHg. FINDINGS  Left Ventricle: Left ventricular ejection fraction, by estimation, is 55 to 60%. The left ventricle has normal function. The left ventricle has no regional wall motion abnormalities. The left ventricular internal cavity size was normal in size. There is  mild concentric left ventricular hypertrophy. Left ventricular diastolic parameters are indeterminate. Right Ventricle: The right ventricular size is normal. No increase in right ventricular wall thickness. Right ventricular systolic function is normal. Left Atrium: Left atrial size was mildly dilated. Right Atrium: Right atrial size was mildly dilated. Pericardium: There is no evidence of pericardial effusion. Mitral Valve: The mitral valve is normal in structure. Trivial mitral valve regurgitation. No evidence of mitral valve stenosis. Tricuspid Valve: The tricuspid valve is normal in structure. Tricuspid valve regurgitation is trivial. No evidence of tricuspid stenosis. Aortic Valve: The aortic valve is tricuspid. There is mild calcification of the aortic valve. Aortic valve regurgitation is trivial. No aortic stenosis is present. Pulmonic Valve: The pulmonic valve was normal in structure. Pulmonic valve regurgitation is not visualized. No evidence of pulmonic stenosis. Aorta: The aortic root is normal in size and structure. Venous: The inferior vena cava is dilated in size with greater than 50% respiratory variability, suggesting right atrial pressure of 8 mmHg. IAS/Shunts: No atrial level shunt detected by color flow Doppler.  LEFT VENTRICLE PLAX 2D LVIDd:         4.50 cm      Diastology LVIDs:         2.80 cm      LV e' medial:    7.83 cm/s LV PW:         1.30 cm  LV E/e' medial:   8.8 LV IVS:        1.20 cm      LV e' lateral:   12.20 cm/s LVOT diam:     2.20 cm      LV E/e' lateral: 5.6 LV SV:         54 LV SV Index:   22 LVOT Area:     3.80 cm  LV Volumes (MOD) LV vol d, MOD A2C: 133.0 ml LV vol d, MOD A4C: 130.0 ml LV vol s, MOD A2C: 54.3 ml LV vol s, MOD A4C: 56.8 ml LV SV MOD A2C:     78.7 ml LV SV MOD A4C:     130.0 ml LV SV MOD BP:      76.0 ml RIGHT VENTRICLE             IVC RV Basal diam:  4.10 cm     IVC diam: 2.20 cm RV S prime:     12.50 cm/s TAPSE (M-mode): 1.5 cm LEFT ATRIUM             Index        RIGHT ATRIUM           Index LA diam:        4.30 cm 1.80 cm/m   RA Area:     19.10 cm LA Vol (A2C):   86.8 ml 36.42 ml/m  RA Volume:   46.30 ml  19.42 ml/m LA Vol (A4C):   81.3 ml 34.11 ml/m LA Biplane Vol: 85.9 ml 36.04 ml/m  AORTIC VALVE LVOT Vmax:   75.50 cm/s LVOT Vmean:  50.000 cm/s LVOT VTI:    0.141 m  AORTA Ao Root diam: 3.20 cm Ao Asc diam:  3.70 cm MITRAL VALVE MV Area (PHT): 4.04 cm    SHUNTS MV Decel Time: 188 msec    Systemic VTI:  0.14 m MV E velocity: 68.60 cm/s  Systemic Diam: 2.20 cm Glori Bickers MD Electronically signed by Glori Bickers MD Signature Date/Time: 04/06/2022/4:35:19 PM    Final    CT Head Wo Contrast  Result Date: 04/05/2022 CLINICAL DATA:  Altered mental status, presyncope, status post brain biopsy 1 week prior. EXAM: CT HEAD WITHOUT CONTRAST TECHNIQUE: Contiguous axial images were obtained from the base of the skull through the vertex without intravenous contrast. RADIATION DOSE REDUCTION: This exam was performed according to the departmental dose-optimization program which includes automated exposure control, adjustment of the mA and/or kV according to patient size and/or use of iterative reconstruction technique. COMPARISON:  03/29/2022 FINDINGS: Brain: Moderate parenchymal volume loss is again seen, stable since prior examination. 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 Punctate foci of pneumocephalus related to biopsy on 03/29/2022 have resolved in the interval. No superimposed acute intracranial hemorrhage. No abnormal mass effect or midline shift. Mild periventricular white matter changes are present likely reflecting the sequela of small vessel ischemia. Ventricular size is normal. Cerebellum is unremarkable. No acute infarct Vascular: No hyperdense vessel or unexpected calcification. Skull: Normal. Negative for fracture or focal lesion. Sinuses/Orbits: Extensive mucosal thickening is seen throughout the visualized paranasal sinuses with near complete opacification of the visualized left maxillary sinus and layering fluid within the right maxillary sinus. This appears mildly progressive since prior examination. Orbits are unremarkable. Other: Mastoid air cells and middle ear cavities are clear. Surgical skin staples noted within the  right frontal scalp overlying craniotomy defect. IMPRESSION: 1. 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 No superimposed acute intracranial hemorrhage. No abnormal mass effect or midline shift. 2. Progressive paranasal sinus disease. Electronically Signed   By: Fidela Salisbury M.D.   On: 04/05/2022 22:18   DG Chest Portable 1 View  Result Date: 04/05/2022 CLINICAL DATA:  Syncope. EXAM: PORTABLE CHEST 1 VIEW COMPARISON:  February 27, 2022. FINDINGS: The heart size and mediastinal contours are within normal limits. Both lungs are clear. The visualized skeletal structures are unremarkable. IMPRESSION: No active disease. Electronically Signed   By: Marijo Conception M.D.   On: 04/05/2022 18:07      LOS: 1 day    Flora Lipps, MD [facility] Available via Epic secure chat 7am-7pm After these hours, please refer to coverage  provider listed on [email or web address] 04/07/2022, 11:51 AM

## 2022-04-07 NOTE — Progress Notes (Signed)
RA Refused CPAP QHS w/ nasal mask not the same as home equip. OSA on CPAP

## 2022-04-07 NOTE — TOC Initial Note (Addendum)
Transition of Care [facility]) - Initial/Assessment Note    Patient Details  Name: Patrick Rocha MRN: [id number] Date of Birth: 08-26-1945  Transition of Care [facility]) CM/SW Contact:    Illene Regulus, LCSW Phone Number: 04/07/2022, 11:54 AM  Clinical Narrative:                 CSW attempted to speak with pt about SNF recommendations, he stated  call his daughter in law Dian Situ.  CSW called Ms Jacki Cones , no answer left VM requesting return call. TOC to follow.      [place] 12:30pm CSW spoke with pt's daughter, she reported pt went to [facility] after his last hospital admission. Pt's daughter has agreed to SNF placement. She reported not having a preferences. CSW will work pt up for SNF placement. TOC to follow   Patient Goals and CMS Choice            Expected Discharge Plan and Services                                              Prior Living Arrangements/Services                       Activities of Daily Living Home Assistive Devices/Equipment: Eyeglasses, Gilford Rile (specify type), Hearing aid ADL Screening (condition at time of admission) Patient's cognitive ability adequate to safely complete daily activities?: Yes Is the patient deaf or have difficulty hearing?: Yes Does the patient have difficulty seeing, even when wearing glasses/contacts?: No Does the patient have difficulty concentrating, remembering, or making decisions?: No Patient able to express need for assistance with ADLs?: Yes Does the patient have difficulty dressing or bathing?: No Independently performs ADLs?: Yes (appropriate for developmental age) Does the patient have difficulty walking or climbing stairs?: No Weakness of Legs: Both Weakness of Arms/Hands: None  Permission Sought/Granted                  Emotional Assessment              Admission diagnosis:  Near syncope [R55] Hypotension [I95.9] Hypotension, unspecified hypotension type  [I95.9] Patient Active Problem List   Diagnosis Date Noted   RSV (respiratory syncytial virus pneumonia) 04/05/2022   Acute renal failure ([place]) 02/28/2022   Paroxysmal atrial fibrillation ([place]) 02/28/2022   Hypothyroidism 02/28/2022   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/2022   Type 2 diabetes mellitus with chronic kidney disease, with long-term current use of insulin ([place]) [id number]   Acute metabolic encephalopathy [id number]   Urinary tract infection without hematuria 02/28/2022   Hypotension 02/27/2022   Dehydration 02/27/2022   Brain mass 01/29/2022   Morbid (severe) obesity due to excess calories ([place]) 01/29/2022   Hypertensive emergency 01/29/2022   Vertigo 01/29/2022   Stage 3b chronic kidney disease (CKD) ([place]) - baseline SCr 1.7-1.9 01/29/2022   COPD (chronic obstructive pulmonary disease) ([place]) 01/16/2018   Left renal mass 03/18/2016   OSA on CPAP 12/10/2015   Benign prostatic hyperplasia 10/20/2010   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 [place] [address] Phone: [phone] Fax: [place] [address], [place] [address] Phone: [phone] Fax: [phone]     Social Determinants of Health  (SDOH) Social History: SDOH Screenings   Food Insecurity: No Food Insecurity (04/06/2022)  Housing: Low Risk  (04/06/2022)  Transportation Needs: No Transportation Needs (04/06/2022)  Utilities: Not At Risk (04/06/2022)  Tobacco Use: Medium Risk (04/05/2022)   SDOH Interventions:     Readmission Risk Interventions     No data to display

## 2022-04-08 LAB — COMPREHENSIVE METABOLIC PANEL
ALT: 13 U/L (ref 0–44)
AST: 17 U/L (ref 15–41)
Albumin: 1.9 g/dL — ABNORMAL LOW (ref 3.5–5.0)
Alkaline Phosphatase: 44 U/L (ref 38–126)
Anion gap: 6 (ref 5–15)
BUN: 35 mg/dL — ABNORMAL HIGH (ref 8–23)
CO2: 23 mmol/L (ref 22–32)
Calcium: 8 mg/dL — ABNORMAL LOW (ref 8.9–10.3)
Chloride: 107 mmol/L (ref 98–111)
Creatinine, Ser: 1.58 mg/dL — ABNORMAL HIGH (ref 0.61–1.24)
GFR, Estimated: 45 mL/min — ABNORMAL LOW (ref >60)
Glucose, Bld: 187 mg/dL — ABNORMAL HIGH (ref 70–99)
Potassium: 4.2 mmol/L (ref 3.5–5.1)
Sodium: 136 mmol/L (ref 135–145)
Total Bilirubin: 0.8 mg/dL (ref 0.3–1.2)
Total Protein: 4.8 g/dL — ABNORMAL LOW (ref 6.5–8.1)

## 2022-04-08 LAB — CBC
HCT: 31.8 % — ABNORMAL LOW (ref 39.0–52.0)
Hemoglobin: 10.3 g/dL — ABNORMAL LOW (ref 13.0–17.0)
MCH: 31.8 pg (ref 26.0–34.0)
MCHC: 32.4 g/dL (ref 30.0–36.0)
MCV: 98.1 fL (ref 80.0–100.0)
Platelets: 125 10*3/uL — ABNORMAL LOW (ref 150–400)
RBC: 3.24 MIL/uL — ABNORMAL LOW (ref 4.22–5.81)
RDW: 15.5 % (ref 11.5–15.5)
WBC: 5.2 10*3/uL (ref 4.0–10.5)
nRBC: 0 % (ref 0.0–0.2)

## 2022-04-08 LAB — GLUCOSE, CAPILLARY
Glucose-Capillary: 177 mg/dL — ABNORMAL HIGH (ref 70–99)
Glucose-Capillary: 285 mg/dL — ABNORMAL HIGH (ref 70–99)
Glucose-Capillary: 312 mg/dL — ABNORMAL HIGH (ref 70–99)
Glucose-Capillary: 306 mg/dL — ABNORMAL HIGH (ref 70–99)

## 2022-04-08 LAB — MAGNESIUM: Magnesium: 1.7 mg/dL (ref 1.7–2.4)

## 2022-04-08 MED ORDER — RIVAROXABAN 15 MG PO TABS
15.0000 mg | ORAL_TABLET | Freq: Every day | ORAL | Status: DC
  Administered 2022-04-08: 15 mg via ORAL
  Filled 2022-04-08: qty 1

## 2022-04-08 NOTE — Inpatient Diabetes Management (Signed)
Inpatient Diabetes Program Recommendations  AACE/ADA: New Consensus Statement on Inpatient Glycemic Control   Target Ranges:  Prepandial:   less than 140 mg/dL      Peak postprandial:   less than 180 mg/dL (1-2 hours)      Critically ill patients:  140 - 180 mg/dL    Latest Reference Range & Units 04/07/22 11:52 04/07/22 17:16 04/07/22 20:51 04/08/22 07:40 04/08/22 11:14  Glucose-Capillary 70 - 99 mg/dL 370 (H) 233 (H) 237 (H) 177 (H) 285 (H)    Review of Glycemic Control  Diabetes history: DM2 Outpatient Diabetes medications: Lantus 40 units daily as needed, Metformin 1000 mg BID, Actos 15 mg daily, Glipizide 10 mg daily, Novolog 4 units TID with meals (not taking), Decadron taper Current orders for Inpatient glycemic control: Levemir 5 units BID, Novolog 0-9 units TID with meals; Decadron 2 mg every other day  Inpatient Diabetes Program Recommendations:    Insulin: If steroids are continued, please consider ordering Novolog 3 units TID with meals for meal coverage if patient eats at least 50% of meals.  Thanks, Barnie Alderman, RN, MSN, [place] Diabetes Coordinator Inpatient Diabetes Program [phone] (Team Pager from 8am to [place])

## 2022-04-08 NOTE — Progress Notes (Signed)
PROGRESS NOTE    Patrick Rocha  [id number] DOB: [birth year] DOA: 04/05/2022 PCP: Patrick Dally, MD    Brief Narrative:  Patrick Rocha is a 77 y.o. male with medical history significant of hypertension, atrial fibrillation on Xarelto, type 2 diabetes mellitus, COPD, CKD3b, OSA on CPAP, brain mass with recent biopsy who was brought into ED for hypotension and syncope while he was trying to get up from toilet after a bowel movement he felt lightheaded and dizzy and pale and passed out.  He had been having diarrhea for at least a week.  Trying to stay hydrated at home but EMS noted the initial blood pressure of BP 60/40 and received IV fluid. He was admitted at the end of January also with hypotension and had AKI with creatinine of 3 at the time improved with fluid resuscitation.  Of note, patient recently  had elective stereotactic right frontal biopsy of lesion on 03/30/2022 with neurosurgery. In the ED, BP have normalized to 115/73. No true orthostatic vital signs but feels symptomatic with standing.  Did not have leukocytosis.  Hemoglobin was around baseline at 10.5.  Chemistry showed creatinine elevated at 1.9 with baseline between 1.7-1.9.  Magnesium was low at 1.6.  EKG showed A-fib with RBBB.  Patient was then considered for admission to hospital for further evaluation and treatment.  Assessment and plan.  Hypotension Has improved.  Likely secondary to volume depletion from ongoing diarrhea.  Initial systolic blood pressure in the 60s improved with IV fluid.  No orthostatic hypotension but was symptomatic with positional dizziness.  Patient still complains of dizziness.  CT head was negative for acute findings status post recent brain biopsy.  RSV positive but patient was also having diarrhea. 2D echocardiogram preserved LV function at 55 to 60%.  TSH low at 0.2.  Continue telemetry.  Urinalysis was negative.  BNP 110.  On gentle IV fluids.  Will discontinue now since  patient is positive balance for 1229 mL at this time has been having lower extremity edema.  Will put the patient on compression stockings.  April 08, 2022: Patient seen.  Hypotension has resolved.  Patient is awaiting disposition.  Hopefully, patient will be discharged tomorrow.  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  Continue dexamethasone.   Stage 3b chronic kidney disease (CKD) (HCC) - baseline SCr 1.7-1.9 -stable around baseline with creatinine of 1.9.  Creatinine today at 1.7.   OSA on CPAP Continue nocturnal CPAP.   Persistent atrial fibrillation (HCC) -s/p ablation.  Patient recently had brain biopsy in Xarelto on hold until 04/08/22 per neurosurgery  April 08, 2022: Consult pharmacy to restart Xarelto.   COPD (chronic obstructive pulmonary disease) (HCC) -stable. Not in exacerbation.  Type 2 diabetes mellitus with stage 3 chronic kidney disease, without long-term current use of insulin with hyperglycemia Continue sliding scale insulin while on steroid taper.  Continue to monitor closely, diabetic diet Accu-Cheks.  Continue to adjust insulin regimen.   RSV (respiratory syncytial virus pneumonia) Continue symptomatic treatment.  No hypoxia.   Hypothyroidism Continue Synthroid.  TSH suppressed at 0.2.  States that he is on the same dose of Synthroid for a while.  Might need to check thyroid function as outpatient.   Deconditioning, debility, falls. Patient was seen by physical therapy and physical therapy recommended skilled nursing facility placement.  Patient did have 4 falls in the last 6 months.  Needed a lot of assistance while in the hospital.  Bilateral lower extremity edema.  Patient was getting wraps at home.  Will consider for Unna boots.  Family states it is getting worse ever since his biopsy.  Possibility of poor oral nutrition.  Encouraged ambulation, elevation of legs when at rest.  Will check CMP in  AM.  Morbid obesity.Body mass index is 41.35 kg/m.  Would benefit from weight loss as outpatient.   DVT prophylaxis: SCDs Start: 04/05/22 2253   Code Status:     Code Status: Full Code  Disposition: Skilled nursing facility likely in 1 to 2 days.  Status is: Inpatient  The patient is  inpatient because: profound debility/deconditioning, need for rehabilitation at skilled nursing facility,   Family Communication:    Consultants:  None at this time  Procedures:  None  Antimicrobials:  None  Anti-infectives (From admission, onward)    None      Subjective: No new complaints. Patient is not a particularly good historian.  Objective: Vitals:   04/07/22 2035 04/07/22 2055 04/08/22 0501 04/08/22 1345  BP:  125/64 (!) 145/85 109/73  Pulse: 100 (!) 104 98 (!) 108  Resp: '18 15 20 20  '$ Temp:  98.1 F (36.7 C) 97.6 F (36.4 C) (!) 97.5 F (36.4 C)  TempSrc:  Oral Oral Oral  SpO2: 97% 98% 97% 97%  Weight:      Height:        Intake/Output Summary (Last 24 hours) at 04/08/2022 1722 Last data filed at 04/08/2022 1345 Gross per 24 hour  Intake 480 ml  Output 1400 ml  Net -920 ml    Filed Weights   04/05/22 1725 04/05/22 1729  Weight: 127 kg 127 kg    Physical Examination: Body mass index is 41.35 kg/m.   General: Morbidly obese.  Not in any distress.   HEENT: Patient is pale.  No jaundice.   Chest:  Clear breath sounds.  Diminished breath sounds bilaterally. No crackles or wheezes.  CVS: S1 &S2 heard.  Abdomen: Soft, nontender,   Obese abdomen.     CNS: Patient moves all extremities.  Awake.  Data Reviewed:   CBC: Recent Labs  Lab 04/05/22 1834 04/07/22 0408 04/08/22 0506  WBC 6.3 5.1 5.2  HGB 10.5* 9.6* 10.3*  HCT 32.0* 29.3* 31.8*  MCV 98.8 97.7 98.1  PLT 128* 108* 125*     Basic Metabolic Panel: Recent Labs  Lab 04/05/22 1834 04/07/22 0408 04/08/22 0506  NA 139 132* 136  K 4.3 4.9 4.2  CL 111 104 107  CO2 '23 23 23  '$ GLUCOSE 141*  399* 187*  BUN 55* 42* 35*  CREATININE 1.92* 1.73* 1.58*  CALCIUM 7.5* 7.7* 8.0*  MG 1.6* 1.7 1.7     Liver Function Tests: Recent Labs  Lab 04/08/22 0506  AST 17  ALT 13  ALKPHOS 44  BILITOT 0.8  PROT 4.8*  ALBUMIN 1.9*     Radiology Studies: No results found.    LOS: 2 days    Patrick Public, MD [facility] Available via Epic secure chat 7am-7pm After these hours, please refer to coverage provider listed on [email or web address] 04/08/2022, 5:22 PM

## 2022-04-08 NOTE — TOC Progression Note (Addendum)
Transition of Care [facility]) - Progression Note    Patient Details  Name: Patrick Rocha MRN: [id number] Date of Birth: Sep 20, 1945  Transition of Care [facility]) CM/SW [place], LCSW Phone Number: 04/08/2022, 3:02 PM  Clinical Narrative:     Pt's insurance auth pending , CSW spoke to Jovista she confirm if pt insurance Josem Kaufmann is approved tomorrow he can d/c to facility. CSW provided pt's daughter with an update. Please contact pt's daughter before pt d/c to facility. TOC to follow.   ADDEN 3:36pm Auth approved , can d/c to facility Saturday. TOC to follow     Expected Discharge Plan and Services                                               Social Determinants of Health (SDOH) Interventions SDOH Screenings   Food Insecurity: No Food Insecurity (04/06/2022)  Housing: Low Risk  (04/06/2022)  Transportation Needs: No Transportation Needs (04/06/2022)  Utilities: Not At Risk (04/06/2022)  Tobacco Use: Medium Risk (04/05/2022)    Readmission Risk Interventions     No data to display

## 2022-04-08 NOTE — Progress Notes (Signed)
Physical Therapy Treatment Patient Details Name: Patrick Rocha MRN: [id number] DOB: 12-08-1945 Today's Date: 04/08/2022   History of Present Illness Patrick Rocha is a 77 y.o. male admitted with hypotension; positive for RSV. CT head negative for acute findings s/p recent brain biopsy. PMH: s/p Stereotactic right frontal brain biopsy 03/29/22, HTN, afib, diabetes, COPD, CKD, GERD, hypothyroidism, OSA on CPAP    PT Comments    Pt assisted with ambulating however only tolerated short distance due to left knee pain which pt reports is new Investment banker, corporate also notified).    Recommendations for follow up therapy are one component of a multi-disciplinary discharge planning process, led by the attending physician.  Recommendations may be updated based on patient status, additional functional criteria and insurance authorization.  Follow Up Recommendations  Skilled nursing-short term rehab (<3 hours/day) Can patient physically be transported by private vehicle: No   Assistance Recommended at Discharge Frequent or constant Supervision/Assistance  Patient can return home with the following A lot of help with walking and/or transfers;A lot of help with bathing/dressing/bathroom;Assistance with cooking/housework;Assist for transportation;Help with stairs or ramp for entrance   Equipment Recommendations  None recommended by PT    Recommendations for Other Services       Precautions / Restrictions Precautions Precautions: Fall Precaution Comments: monitor , dizzy with ambulation     Mobility  Bed Mobility Overal bed mobility: Needs Assistance Bed Mobility: Supine to Sit     Supine to sit: Supervision     General bed mobility comments: Increased time and use of bed rail but no physical assist    Transfers Overall transfer level: Needs assistance Equipment used: Rolling walker (2 wheels) Transfers: Sit to/from Stand Sit to Stand: Min guard           General transfer  comment: pt reports left knee pain today that is new (RN notified), able to stand with min/guard assist, cues hand placement and controlling descent    Ambulation/Gait Ambulation/Gait assistance: Min guard Gait Distance (Feet): 40 Feet Assistive device: Rolling walker (2 wheels) Gait Pattern/deviations: Step-through pattern, Decreased stride length Gait velocity: decr     General Gait Details: cues for weight bearing on RW to assist with managing left knee pain, distance limited by pain, pt also reports a little dizziness end of ambulation, SPO2 100% RA   Stairs             Wheelchair Mobility    Modified Rankin (Stroke Patients Only)       Balance                                            Cognition Arousal/Alertness: Awake/alert Behavior During Therapy: Flat affect Overall Cognitive Status: Within Functional Limits for tasks assessed                                          Exercises      General Comments        Pertinent Vitals/Pain Pain Assessment Pain Assessment: Faces Faces Pain Scale: Hurts little more Pain Location: left knee Pain Descriptors / Indicators: Sharp Pain Intervention(s): Repositioned, Monitored during session, Patient requesting pain meds-RN notified    Home Living  Prior Function            PT Goals (current goals can now be found in the care plan section) Progress towards PT goals: Progressing toward goals    Frequency    Min 2X/week      PT Plan Current plan remains appropriate    Co-evaluation              AM-PAC PT "6 Clicks" Mobility   Outcome Measure  Help needed turning from your back to your side while in a flat bed without using bedrails?: A Little Help needed moving from lying on your back to sitting on the side of a flat bed without using bedrails?: A Little Help needed moving to and from a bed to a chair (including a  wheelchair)?: A Little Help needed standing up from a chair using your arms (e.g., wheelchair or bedside chair)?: A Little Help needed to walk in hospital room?: A Little Help needed climbing 3-5 steps with a railing? : Total 6 Click Score: 16    End of Session Equipment Utilized During Treatment: Gait belt Activity Tolerance: Patient tolerated treatment well Patient left: in chair;with call bell/phone within reach;with chair alarm set;with nursing/sitter in room Nurse Communication: Patient requests pain meds PT Visit Diagnosis: Difficulty in walking, not elsewhere classified (R26.2)     Time: [id number] PT Time Calculation (min) (ACUTE ONLY): 19 min  Charges:  $Gait Training: 8-22 mins                    Arlyce Dice, DPT Physical Therapist Acute Rehabilitation Services Preferred contact method: Secure Chat Weekend Pager Only: [phone] Office: [place] 04/08/2022, 2:17 PM

## 2022-04-08 NOTE — Progress Notes (Signed)
[place] for xarelto Indication: hx atrial fibrillation  No Known Allergies  Patient Measurements: Height: '5\' 9"'$  (175.3 cm) Weight: 127 kg (280 lb) IBW/kg (Calculated) : 70.7 Heparin Dosing Weight:   Vital Signs: Temp: 97.5 F (36.4 C) (03/01 1345) Temp Source: Oral (03/01 1345) BP: 109/73 (03/01 1345) Pulse Rate: 108 (03/01 1345)  Labs: Recent Labs    04/05/22 1834 04/07/22 0408 04/08/22 0506  HGB 10.5* 9.6* 10.3*  HCT 32.0* 29.3* 31.8*  PLT 128* 108* 125*  CREATININE 1.92* 1.73* 1.58*    Estimated Creatinine Clearance: 53.3 mL/min (A) (by C-G formula based on SCr of 1.58 mg/dL (H)).   Medications:  - Spoke to pt's daughter on 04/08/22. Pt's daughter Herschel [place] reported that he had his brain biopsy on 03/29/22 and he stopped taking his xarelto a week prior for the procedure (last dose taken on 03/22/22).  Herschel [place] stated that his neurosurgeon instructed him to resume the xarelto back on 04/08/22. - PTA xarelto dose: 15 mg daily   Assessment: Patient's a 77 y.o M with hx afib on xarelto PTA and s/p recent biopsy on 03/29/22 for brain mass. Pharmacy has been consulted to resume xarelto back today (04/08/22).    Plan:  - resume xarelto 15 mg PO daily - monitor fort s/sx bleeding  Michaiah Maiden P 04/08/2022,5:44 PM

## 2022-04-08 NOTE — TOC Progression Note (Addendum)
Transition of Care [facility]) - Progression Note    Patient Details  Name: Patrick Rocha MRN: [id number] Date of Birth: September 20, 1945  Transition of Care [facility]) CM/SW [place], LCSW Phone Number: 04/08/2022, 10:20 AM  Clinical Narrative:     Spoke with pt's daughter presented bed offers, she is requesting time to review. TOC to follow.  ADDEN 10:30am Pt's daughter chose [facility] , CSW will start insurance auth close to medically stability.  TOC to follow.    12:50pm [employer] fast streamer pending .    Expected Discharge Plan and Services                                               Social Determinants of Health (SDOH) Interventions SDOH Screenings   Food Insecurity: No Food Insecurity (04/06/2022)  Housing: Low Risk  (04/06/2022)  Transportation Needs: No Transportation Needs (04/06/2022)  Utilities: Not At Risk (04/06/2022)  Tobacco Use: Medium Risk (04/05/2022)    Readmission Risk Interventions     No data to display

## 2022-04-09 LAB — GLUCOSE, CAPILLARY
Glucose-Capillary: 239 mg/dL — ABNORMAL HIGH (ref 70–99)
Glucose-Capillary: 256 mg/dL — ABNORMAL HIGH (ref 70–99)

## 2022-04-09 MED ORDER — BENAZEPRIL HCL 20 MG PO TABS: 20.0000 mg | ORAL_TABLET | Freq: Every day | ORAL | 0 refills | Status: DC

## 2022-04-09 MED ORDER — DEXAMETHASONE 2 MG PO TABS: 2.0000 mg | ORAL_TABLET | ORAL | 0 refills | Status: AC

## 2022-04-09 MED ORDER — LANTUS SOLOSTAR 100 UNIT/ML ~~LOC~~ SOPN: 5.0000 [IU] | PEN_INJECTOR | Freq: Every day | SUBCUTANEOUS | 11 refills | Status: DC

## 2022-04-09 MED ORDER — CARVEDILOL 3.125 MG PO TABS: 1.5625 mg | ORAL_TABLET | Freq: Two times a day (BID) | ORAL | 0 refills | Status: DC

## 2022-04-09 NOTE — Progress Notes (Signed)
PT using personal CPAP. Electrical cord is not frayed and is plugged in a red outlet. RT provided sterile water.

## 2022-04-09 NOTE — Discharge Summary (Signed)
Physician Discharge Summary  Patient ID: Patrick Rocha MRN: [id number] DOB/AGE: May 27, 1945 77 y.o.  Admit date: 04/05/2022 Discharge date: 04/09/2022  Admission Diagnoses:  Discharge Diagnoses:  Principal Problem:   Hypotension Active Problems:   Brain mass   Controlled type 2 diabetes mellitus with stage 3 chronic kidney disease, without long-term current use of insulin (HCC)   COPD (chronic obstructive pulmonary disease) (HCC)   Persistent atrial fibrillation (HCC)   OSA on CPAP   Stage 3b chronic kidney disease (CKD) (HCC) - baseline SCr 1.7-1.9   Hypothyroidism   RSV (respiratory syncytial virus pneumonia)   Volume depletion.   Discharged Condition: stable  Hospital Course: Patient is a 77 year old male with past medical history significant for hypertension, atrial fibrillation on Xarelto, type 2 diabetes mellitus, COPD, CKD3b, OSA on CPAP, brain mass with recent biopsy.  Patient was admitted with hypotension and syncope while he was trying to get up from toilet after a bowel movement.  Apparently, patient felt lightheaded, dizzy and pale and passed out.  He had been having diarrhea for at least a week.  EMS noted the initial blood pressure of BP 60/40 and received IV fluid. He was admitted at the end of January also with hypotension and had AKI with creatinine of 3 at the time.  Serum creatinine improved with fluid resuscitation.  Of note, patient recently  had elective stereotactic right frontal biopsy of lesion on 03/30/2022 with neurosurgery. In the ED, BP normalized to 115/73.  No leukocytosis. Hemoglobin was around baseline at 10.5.  Chemistry revealed creatinine of 1.9 with baseline between 1.7-1.9.  Magnesium was low at 1.6.  EKG showed A-fib with RBBB.  Patient was admitted for further assessment and management.     Hypotension -Improved with volume resuscitation. - Likely secondary to volume depletion from ongoing diarrhea.   -Initial systolic blood pressure was in  the 60s, improved with IV fluid.   -CT head was negative for acute findings.  Patient is status post recent brain biopsy.   -RSV positive but patient was also having diarrhea.  -2D echocardiogram revealed preserved LV function at 55 to 60%.   -TSH was low at 0.2.   -Urinalysis was negative.      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  Continue dexamethasone. -Neurosurgery and PCP to advise on further weaning of dexamethasone.  2 weeks prescription of dexamethasone given.   Stage 3b chronic kidney disease (CKD) (HCC) - baseline SCr 1.7-1.9 -stable around baseline with creatinine of 1.9.  Creatinine today at 1.7.   OSA on CPAP Continue nocturnal CPAP.   Persistent atrial fibrillation (HCC) -s/p ablation.  Patient recently had brain biopsy in Xarelto on hold until 04/08/22 per neurosurgery  April 08, 2022: Consult pharmacy to restart Xarelto.   COPD (chronic obstructive pulmonary disease) (HCC) -stable. Not in exacerbation.   Type 2 diabetes mellitus with stage 3 chronic kidney disease, without long-term current use of insulin with hyperglycemia Continue sliding scale insulin while on steroid taper.  Continue to monitor closely, diabetic diet Accu-Cheks.  Continue to adjust insulin regimen.   RSV (respiratory syncytial virus pneumonia) Continue symptomatic treatment.  No hypoxia.   Hypothyroidism Continue Synthroid.  TSH suppressed at 0.2.  States that he is on the same dose of Synthroid for a while.  Might need to check thyroid function as outpatient.   Deconditioning, debility, falls. Patient was seen by physical therapy and physical therapy recommended skilled nursing facility placement.  Patient  did have 4 falls in the last 6 months.  Needed a lot of assistance while in the hospital.   Bilateral lower extremity edema.  Patient was getting wraps at home.  Will consider for Unna boots.  Family states it is getting worse ever  since his biopsy.  Possibility of poor oral nutrition.  Encouraged ambulation, elevation of legs when at rest.  Will check CMP in AM.   Morbid obesity.Body mass index is 41.35 kg/m.  Would benefit from weight loss as outpatient.    Consults: None   Discharge Exam: Blood pressure 138/70, pulse (!) 111, temperature 98.3 F (36.8 C), temperature source Oral, resp. rate 18, height '5\' 9"'$  (1.753 m), weight 127 kg, SpO2 98 %.   Disposition: Discharge disposition: 03-Skilled Nursing Facility       Discharge Instructions     Diet - low sodium heart healthy   Complete by: As directed    Increase activity slowly   Complete by: As directed       Allergies as of 04/09/2022   No Known Allergies      Medication List     STOP taking these medications    Cinnamon 500 MG capsule   diltiazem 240 MG 24 hr capsule Commonly known as: DILACOR XR   fexofenadine 180 MG tablet Commonly known as: ALLEGRA   Fish Oil 1000 MG Caps   furosemide 20 MG tablet Commonly known as: LASIX   HYDROcodone-acetaminophen 5-325 MG tablet Commonly known as: NORCO/VICODIN   insulin aspart 100 UNIT/ML injection Commonly known as: novoLOG   ketoconazole 2 % cream Commonly known as: NIZORAL   meclizine 25 MG tablet Commonly known as: ANTIVERT   metFORMIN 1000 MG tablet Commonly known as: GLUCOPHAGE   NON FORMULARY   pioglitazone 15 MG tablet Commonly known as: ACTOS   polyethylene glycol 17 g packet Commonly known as: MIRALAX / GLYCOLAX   senna-docusate 8.6-50 MG tablet Commonly known as: Senokot-S   sodium bicarbonate 650 MG tablet       TAKE these medications    atorvastatin 40 MG tablet Commonly known as: LIPITOR Take 40 mg by mouth daily.   benazepril 20 MG tablet Commonly known as: LOTENSIN Take 1 tablet (20 mg total) by mouth daily. What changed:  medication strength how much to take   carvedilol 3.125 MG tablet Commonly known as: COREG Take 0.5 tablets (1.5625  mg total) by mouth 2 (two) times daily with a meal. What changed:  medication strength how much to take   dexamethasone 2 MG tablet Commonly known as: DECADRON Take 1 tablet (2 mg total) by mouth every other day for 14 days. Start taking on: April 10, 2022 What changed: See the new instructions.   escitalopram 10 MG tablet Commonly known as: LEXAPRO Take 10 mg by mouth daily.   ferrous sulfate 325 (65 FE) MG tablet Take 325 mg by mouth 2 (two) times daily with a meal.   glipiZIDE 10 MG tablet Commonly known as: GLUCOTROL Take 10 mg by mouth daily.   Lantus SoloStar 100 UNIT/ML Solostar Pen Generic drug: insulin glargine Inject 5 Units into the skin daily. What changed:  how much to take when to take this reasons to take this   levothyroxine 150 MCG tablet Commonly known as: SYNTHROID Take 150 mcg by mouth daily before breakfast.   pantoprazole 40 MG tablet Commonly known as: PROTONIX Take 40 mg by mouth 2 (two) times daily.   Rivaroxaban 15 MG Tabs tablet Commonly known as:  XARELTO Take 1 tablet (15 mg total) by mouth daily with supper.   tamsulosin 0.4 MG Caps capsule Commonly known as: FLOMAX Take 0.4 mg by mouth daily.        Contact information for after-discharge care     Destination     [facility] Preferred SNF .   Service: Skilled Nursing Contact information: [place] [phone]                     Signed: Bonnell Public 04/09/2022, 1:59 PM

## 2022-04-09 NOTE — TOC Transition Note (Signed)
Transition of Care [facility]) - CM/SW Discharge Note   Patient Details  Name: Patrick Rocha MRN: [id number] Date of Birth: 1945-02-26  [facility]) CM/SW Contact:  Rodney Booze, LCSW Phone Number: 04/09/2022, 1:11 PM   Clinical Narrative:    CSW has given report number which is [phone] room number 75 CSW will call PTAR.          Patient Goals and CMS Choice      Discharge Placement                         Discharge Plan and Services Additional resources added to the After Visit Summary for                                       Social Determinants of Health (SDOH) Interventions SDOH Screenings   Food Insecurity: No Food Insecurity (04/06/2022)  Housing: Low Risk  (04/06/2022)  Transportation Needs: No Transportation Needs (04/06/2022)  Utilities: Not At Risk (04/06/2022)  Tobacco Use: Medium Risk (04/05/2022)     Readmission Risk Interventions     No data to display

## 2022-04-09 NOTE — Progress Notes (Signed)
Report called to Maudie Mercury RN at receiving facility [facility]. Patient going to room 108, awaiting D/C info and transport for patient.

## 2022-04-09 NOTE — Progress Notes (Signed)
Patient Aox4, leaving with PTAR

## 2022-04-11 ENCOUNTER — Encounter (HOSPITAL_COMMUNITY): Payer: Self-pay

## 2022-04-11 ENCOUNTER — Inpatient Hospital Stay: Payer: Medicare Other | Attending: Neurological Surgery

## 2022-04-11 ENCOUNTER — Other Ambulatory Visit: Payer: Self-pay | Admitting: Radiation Therapy

## 2022-04-11 DIAGNOSIS — Z7901 Long term (current) use of anticoagulants: Secondary | ICD-10-CM | POA: Insufficient documentation

## 2022-04-11 DIAGNOSIS — E1122 Type 2 diabetes mellitus with diabetic chronic kidney disease: Secondary | ICD-10-CM | POA: Insufficient documentation

## 2022-04-11 DIAGNOSIS — J320 Chronic maxillary sinusitis: Secondary | ICD-10-CM | POA: Insufficient documentation

## 2022-04-11 DIAGNOSIS — I129 Hypertensive chronic kidney disease with stage 1 through stage 4 chronic kidney disease, or unspecified chronic kidney disease: Secondary | ICD-10-CM | POA: Insufficient documentation

## 2022-04-11 DIAGNOSIS — R4182 Altered mental status, unspecified: Secondary | ICD-10-CM | POA: Insufficient documentation

## 2022-04-11 DIAGNOSIS — Z87891 Personal history of nicotine dependence: Secondary | ICD-10-CM | POA: Insufficient documentation

## 2022-04-11 DIAGNOSIS — R600 Localized edema: Secondary | ICD-10-CM | POA: Insufficient documentation

## 2022-04-11 DIAGNOSIS — M25562 Pain in left knee: Secondary | ICD-10-CM | POA: Insufficient documentation

## 2022-04-11 DIAGNOSIS — I4891 Unspecified atrial fibrillation: Secondary | ICD-10-CM | POA: Insufficient documentation

## 2022-04-11 DIAGNOSIS — Z794 Long term (current) use of insulin: Secondary | ICD-10-CM | POA: Insufficient documentation

## 2022-04-11 DIAGNOSIS — C711 Malignant neoplasm of frontal lobe: Secondary | ICD-10-CM | POA: Insufficient documentation

## 2022-04-11 DIAGNOSIS — R42 Dizziness and giddiness: Secondary | ICD-10-CM | POA: Insufficient documentation

## 2022-04-11 DIAGNOSIS — I351 Nonrheumatic aortic (valve) insufficiency: Secondary | ICD-10-CM | POA: Insufficient documentation

## 2022-04-11 DIAGNOSIS — Z7984 Long term (current) use of oral hypoglycemic drugs: Secondary | ICD-10-CM | POA: Insufficient documentation

## 2022-04-11 DIAGNOSIS — R55 Syncope and collapse: Secondary | ICD-10-CM | POA: Insufficient documentation

## 2022-04-11 DIAGNOSIS — Z85528 Personal history of other malignant neoplasm of kidney: Secondary | ICD-10-CM | POA: Insufficient documentation

## 2022-04-11 DIAGNOSIS — N1832 Chronic kidney disease, stage 3b: Secondary | ICD-10-CM | POA: Insufficient documentation

## 2022-04-11 DIAGNOSIS — Z79899 Other long term (current) drug therapy: Secondary | ICD-10-CM | POA: Insufficient documentation

## 2022-04-11 DIAGNOSIS — W19XXXA Unspecified fall, initial encounter: Secondary | ICD-10-CM | POA: Insufficient documentation

## 2022-04-11 DIAGNOSIS — E039 Hypothyroidism, unspecified: Secondary | ICD-10-CM | POA: Insufficient documentation

## 2022-04-11 DIAGNOSIS — C719 Malignant neoplasm of brain, unspecified: Secondary | ICD-10-CM

## 2022-04-11 DIAGNOSIS — Y92019 Unspecified place in single-family (private) house as the place of occurrence of the external cause: Secondary | ICD-10-CM | POA: Insufficient documentation

## 2022-04-11 DIAGNOSIS — M7989 Other specified soft tissue disorders: Secondary | ICD-10-CM | POA: Insufficient documentation

## 2022-04-11 LAB — SURGICAL PATHOLOGY

## 2022-04-13 ENCOUNTER — Telehealth: Payer: Self-pay | Admitting: Internal Medicine

## 2022-04-13 NOTE — Telephone Encounter (Signed)
Scheduled appt per 3/4 referral. Pt's daughter is aware of appt date and time. Pt's daughter is aware to arrive 15 mins prior to appt time and to bring and updated insurance card. Pt's daughter is aware of appt location.

## 2022-04-21 ENCOUNTER — Inpatient Hospital Stay (HOSPITAL_BASED_OUTPATIENT_CLINIC_OR_DEPARTMENT_OTHER): Payer: Medicare Other | Admitting: Internal Medicine

## 2022-04-21 VITALS — BP 135/63 | HR 62 | Temp 97.7°F | Resp 18 | Wt 262.3 lb

## 2022-04-21 DIAGNOSIS — W19XXXA Unspecified fall, initial encounter: Secondary | ICD-10-CM | POA: Diagnosis not present

## 2022-04-21 DIAGNOSIS — I129 Hypertensive chronic kidney disease with stage 1 through stage 4 chronic kidney disease, or unspecified chronic kidney disease: Secondary | ICD-10-CM | POA: Diagnosis not present

## 2022-04-21 DIAGNOSIS — Z794 Long term (current) use of insulin: Secondary | ICD-10-CM

## 2022-04-21 DIAGNOSIS — E039 Hypothyroidism, unspecified: Secondary | ICD-10-CM | POA: Diagnosis not present

## 2022-04-21 DIAGNOSIS — Z7984 Long term (current) use of oral hypoglycemic drugs: Secondary | ICD-10-CM | POA: Diagnosis not present

## 2022-04-21 DIAGNOSIS — R600 Localized edema: Secondary | ICD-10-CM

## 2022-04-21 DIAGNOSIS — E1122 Type 2 diabetes mellitus with diabetic chronic kidney disease: Secondary | ICD-10-CM | POA: Diagnosis not present

## 2022-04-21 DIAGNOSIS — Z79899 Other long term (current) drug therapy: Secondary | ICD-10-CM | POA: Diagnosis not present

## 2022-04-21 DIAGNOSIS — R55 Syncope and collapse: Secondary | ICD-10-CM | POA: Diagnosis not present

## 2022-04-21 DIAGNOSIS — N1832 Chronic kidney disease, stage 3b: Secondary | ICD-10-CM | POA: Diagnosis not present

## 2022-04-21 DIAGNOSIS — R42 Dizziness and giddiness: Secondary | ICD-10-CM | POA: Diagnosis not present

## 2022-04-21 DIAGNOSIS — M25562 Pain in left knee: Secondary | ICD-10-CM

## 2022-04-21 DIAGNOSIS — I4891 Unspecified atrial fibrillation: Secondary | ICD-10-CM | POA: Diagnosis not present

## 2022-04-21 DIAGNOSIS — R4182 Altered mental status, unspecified: Secondary | ICD-10-CM | POA: Diagnosis not present

## 2022-04-21 DIAGNOSIS — C711 Malignant neoplasm of frontal lobe: Secondary | ICD-10-CM

## 2022-04-21 DIAGNOSIS — I351 Nonrheumatic aortic (valve) insufficiency: Secondary | ICD-10-CM | POA: Diagnosis not present

## 2022-04-21 DIAGNOSIS — Y92019 Unspecified place in single-family (private) house as the place of occurrence of the external cause: Secondary | ICD-10-CM | POA: Diagnosis not present

## 2022-04-21 DIAGNOSIS — Z85528 Personal history of other malignant neoplasm of kidney: Secondary | ICD-10-CM | POA: Diagnosis not present

## 2022-04-21 DIAGNOSIS — Z7901 Long term (current) use of anticoagulants: Secondary | ICD-10-CM | POA: Diagnosis not present

## 2022-04-21 DIAGNOSIS — Z87891 Personal history of nicotine dependence: Secondary | ICD-10-CM | POA: Diagnosis not present

## 2022-04-21 DIAGNOSIS — J320 Chronic maxillary sinusitis: Secondary | ICD-10-CM

## 2022-04-21 DIAGNOSIS — M7989 Other specified soft tissue disorders: Secondary | ICD-10-CM

## 2022-04-21 DIAGNOSIS — C719 Malignant neoplasm of brain, unspecified: Secondary | ICD-10-CM | POA: Insufficient documentation

## 2022-04-21 NOTE — Progress Notes (Signed)
[facility] [place], [place] [address] [phone]   New Patient Evaluation  Date of Service: 04/21/22 Patient Name: Patrick Rocha Patient MRN: [id number] Patient DOB: 17-Aug-1945 Provider: Ventura Sellers, MD  Identifying Statement:  Patrick Rocha is a 77 y.o. 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    Referring Provider: Karsten Ro, DO [address] [place],  [place] [address]  Oncologic History: Oncology History  Oligodendroglioma [facility])  03/29/2022 Surgery   Stereotactic biopsy with Dr. 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  IDH 1/2 Mutated.  EGFR Unknown  1p/19q Unknown   History of Present Illness: The patient's records from the referring physician were obtained and reviewed and the patient interviewed to confirm this HPI.  Patrick Rocha presented to neurologic attention last month with new onset vertigo and fall at home.  Denies any seizure activity.  CNS imaging demonstrated non-enhancing right frontal mass.  He underwent stereotactic biopsy with Dr. Reatha Armour on 03/29/22; path demonstrated diffuse oligodendroglioma, IDH mutant.  Since surgery, he has experienced significant fluid retention and swelling.  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  He is not currently walking on his own, residing in a rehab facility.   Medications: Current Outpatient Medications on File Prior to Visit  Medication Sig Dispense Refill   atorvastatin (LIPITOR) 40 MG tablet Take 40 mg by mouth daily.     benazepril (LOTENSIN) 20 MG tablet Take 1 tablet (20 mg total) by mouth daily. 30 tablet 0   carvedilol (COREG) 3.125 MG tablet Take 0.5 tablets (1.5625 mg total) by mouth 2 (two) times daily with a  meal. 30 tablet 0   dexamethasone (DECADRON) 2 MG tablet Take 1 tablet (2 mg total) by mouth every other day for 14 days. 7 tablet 0   escitalopram (LEXAPRO) 10 MG tablet Take 10 mg by mouth daily.     ferrous sulfate 325 (65 FE) MG tablet Take 325 mg by mouth 2 (two) times daily with a meal.     furosemide (LASIX) 20 MG tablet Take 20 mg by mouth daily. ENDS on 04/24/2022     glipiZIDE (GLUCOTROL) 10 MG tablet Take 10 mg by mouth daily.     LANTUS SOLOSTAR 100 UNIT/ML Solostar Pen Inject 5 Units into the skin daily. (Patient taking differently: Inject 20 Units into the skin daily.) 15 mL 11   levothyroxine (SYNTHROID, LEVOTHROID) 150 MCG tablet Take 150 mcg by mouth daily before breakfast.     pantoprazole (PROTONIX) 40 MG tablet Take 40 mg by mouth 2 (two) times daily.     Rivaroxaban (XARELTO) 15 MG TABS tablet Take 1 tablet (15 mg total) by mouth daily with supper. 42 tablet 0   tamsulosin (FLOMAX) 0.4 MG CAPS capsule Take 0.4 mg by mouth daily.     tobramycin (TOBREX) 0.3 % ophthalmic solution Place 1 drop into the right eye every 4 (four) hours.     No current facility-administered medications on file prior to visit.    Allergies: No Known Allergies Past Medical History:  Past Medical History:  Diagnosis Date   Atrial fibrillation ([place]) 2004   pt had ablation around 2004 and says he has not went into atrial fibrillation since  Chronic kidney disease    stage 3   Depression    Diabetes mellitus without complication (HCC)    GERD (gastroesophageal reflux disease)    Hx of seasonal allergies    Hypertension 07/20/2010   Hypothyroidism    renal ca    cryoablation left kidney 02/2016   Sleep apnea    uses CPAP   Past Surgical History:  Past Surgical History:  Procedure Laterality Date   IR GENERIC HISTORICAL  02/11/2016   IR RADIOLOGIST EVAL & MGMT 02/11/2016 Corrie Mckusick, DO [facility] INTERV RAD   IR RADIOLOGIST EVAL & MGMT  04/12/2016   IR RADIOLOGIST EVAL & MGMT  12/07/2016    IR RADIOLOGIST EVAL & MGMT  06/06/2017   IR RADIOLOGIST EVAL & MGMT  09/18/2018   IR RADIOLOGIST EVAL & MGMT  10/01/2019   IR RADIOLOGIST EVAL & MGMT  09/08/2020   PR DURAL GRAFT SPINAL Right 03/29/2022   Procedure: FRONTAL FRAMELESS STEREOTACTIC BIOPSY BRAIN WITH STEALTH;  Surgeon: Karsten Ro, DO;  Location: [facility];  Service: Neurosurgery;  Laterality: Right;   radioactive iodine thyroid     pt states this was many years ago   Social History:  Social History   Socioeconomic History   Marital status: Widowed    Spouse name: Not on file   Number of children: 0   Years of education: Not on file   Highest education level: Not on file  Occupational History   Not on file  Tobacco Use   Smoking status: Former    Types: Cigarettes    Quit date: 6    Years since quitting: 30.2   Smokeless tobacco: Never  Vaping Use   Vaping Use: Never used  Substance and Sexual Activity   Alcohol use: Not Currently    Comment: 1-2 drinks once every few months   Drug use: Never   Sexual activity: Not Currently  Other Topics Concern   Not on file  Social History Narrative   Pt has 2 stepchildren   Social Determinants of Health   Financial Resource Strain: Not on file  Food Insecurity: No Food Insecurity (04/06/2022)   Hunger Vital Sign    Worried About Running Out of Food in the Last Year: Never true    Ran Out of Food in the Last Year: Never true  Transportation Needs: No Transportation Needs (04/06/2022)   PRAPARE - Hydrologist (Medical): No    Lack of Transportation (Non-Medical): No  Physical Activity: Not on file  Stress: Not on file  Social Connections: Not on file  Intimate Partner Violence: Not At Risk (04/06/2022)   Humiliation, Afraid, Rape, and Kick questionnaire    Fear of Current or Ex-Partner: No    Emotionally Abused: No    Physically Abused: No    Sexually Abused: No   Family History: No family history on file.  Review of  Systems: Constitutional: Doesn't report fevers, chills or abnormal weight loss Eyes: Doesn't report blurriness of vision Ears, nose, mouth, throat, and face: Doesn't report sore throat Respiratory: Doesn't report cough, dyspnea or wheezes Cardiovascular: Doesn't report palpitation, chest discomfort  Gastrointestinal:  Doesn't report nausea, constipation, diarrhea GU: Doesn't report incontinence Skin: Doesn't report skin rashes Neurological: Per HPI Musculoskeletal: Doesn't report joint pain Behavioral/Psych: Doesn't report anxiety  Physical Exam: Vitals:   04/21/22 1438  BP: 135/63  Pulse: 62  Resp: 18  Temp: 97.7 F (36.5 C)  SpO2: 93%   KPS: 60. General:  Alert, cooperative, pleasant, in no acute distress Head: Normal EENT: No conjunctival injection or scleral icterus.  Lungs: Resp effort normal Cardiac: Regular rate Abdomen: Non-distended abdomen Skin: No rashes cyanosis or petechiae. Extremities: 3++ pitting edema to knees, including hands  Neurologic Exam: Mental Status: Awake, alert, attentive to examiner. Oriented to self and environment. Language is fluent with intact comprehension.  Cranial Nerves: Visual acuity is grossly normal. Visual fields are full. Extra-ocular movements intact. No ptosis. Face is symmetric Motor: Tone and bulk are normal. Power is limited by edema in limbs. Reflexes are symmetric, no pathologic reflexes present.  Sensory: Intact to light touch Gait: Non ambulatory   Labs: I have reviewed the data as listed    Component Value Date/Time   NA 136 04/08/2022 0506   K 4.2 04/08/2022 0506   CL 107 04/08/2022 0506   CO2 23 04/08/2022 0506   GLUCOSE 187 (H) 04/08/2022 0506   BUN 35 (H) 04/08/2022 0506   CREATININE 1.58 (H) 04/08/2022 0506   CALCIUM 8.0 (L) 04/08/2022 0506   PROT 4.8 (L) 04/08/2022 0506   ALBUMIN 1.9 (L) 04/08/2022 0506   AST 17 04/08/2022 0506   ALT 13 04/08/2022 0506   ALKPHOS 44 04/08/2022 0506   BILITOT 0.8  04/08/2022 0506   GFRNONAA 45 (L) 04/08/2022 0506   Lab Results  Component Value Date   WBC 5.2 04/08/2022   NEUTROABS 5.8 03/01/2022   HGB 10.3 (L) 04/08/2022   HCT 31.8 (L) 04/08/2022   MCV 98.1 04/08/2022   PLT 125 (L) 04/08/2022    Imaging:  ECHOCARDIOGRAM COMPLETE  Result Date: 04/06/2022    ECHOCARDIOGRAM REPORT   Patient Name:   FEMI WEBB [facility] Date of Exam: 04/06/2022 Medical Rec #:  [id number]               Height:       69.0 in Accession #:    [id number]              Weight:       280.0 lb Date of Birth:  01-28-1946              BSA:          2.384 m Patient Age:    23 years                BP:           124/72 mmHg Patient Gender: M                       HR:           95 bpm. Exam Location:  Inpatient Procedure: 2D Echo, Cardiac Doppler and Color Doppler Indications:    Syncope  History:        Patient has no prior history of Echocardiogram examinations.                 Arrythmias:Atrial Fibrillation; Risk Factors:Hypertension.  Sonographer:    Phineas Douglas Referring Phys: [id number] [place] T TU IMPRESSIONS  1. Left ventricular ejection fraction, by estimation, is 55 to 60%. The left ventricle has normal function. The left ventricle has no regional wall motion abnormalities. There is mild concentric left ventricular hypertrophy. Left ventricular diastolic parameters are indeterminate.  2. Right ventricular systolic function is normal. The right ventricular size is normal.  3. Left atrial size was mildly dilated.  4. Right atrial size was mildly dilated.  5. The mitral valve is normal in  structure. Trivial mitral valve regurgitation. No evidence of mitral stenosis.  6. The aortic valve is tricuspid. There is mild calcification of the aortic valve. Aortic valve regurgitation is trivial. No aortic stenosis is present.  7. The inferior vena cava is dilated in size with >50% respiratory variability, suggesting right atrial pressure of 8 mmHg. FINDINGS  Left Ventricle: Left ventricular  ejection fraction, by estimation, is 55 to 60%. The left ventricle has normal function. The left ventricle has no regional wall motion abnormalities. The left ventricular internal cavity size was normal in size. There is  mild concentric left ventricular hypertrophy. Left ventricular diastolic parameters are indeterminate. Right Ventricle: The right ventricular size is normal. No increase in right ventricular wall thickness. Right ventricular systolic function is normal. Left Atrium: Left atrial size was mildly dilated. Right Atrium: Right atrial size was mildly dilated. Pericardium: There is no evidence of pericardial effusion. Mitral Valve: The mitral valve is normal in structure. Trivial mitral valve regurgitation. No evidence of mitral valve stenosis. Tricuspid Valve: The tricuspid valve is normal in structure. Tricuspid valve regurgitation is trivial. No evidence of tricuspid stenosis. Aortic Valve: The aortic valve is tricuspid. There is mild calcification of the aortic valve. Aortic valve regurgitation is trivial. No aortic stenosis is present. Pulmonic Valve: The pulmonic valve was normal in structure. Pulmonic valve regurgitation is not visualized. No evidence of pulmonic stenosis. Aorta: The aortic root is normal in size and structure. Venous: The inferior vena cava is dilated in size with greater than 50% respiratory variability, suggesting right atrial pressure of 8 mmHg. IAS/Shunts: No atrial level shunt detected by color flow Doppler.  LEFT VENTRICLE PLAX 2D LVIDd:         4.50 cm      Diastology LVIDs:         2.80 cm      LV e' medial:    7.83 cm/s LV PW:         1.30 cm      LV E/e' medial:  8.8 LV IVS:        1.20 cm      LV e' lateral:   12.20 cm/s LVOT diam:     2.20 cm      LV E/e' lateral: 5.6 LV SV:         54 LV SV Index:   22 LVOT Area:     3.80 cm  LV Volumes (MOD) LV vol d, MOD A2C: 133.0 ml LV vol d, MOD A4C: 130.0 ml LV vol s, MOD A2C: 54.3 ml LV vol s, MOD A4C: 56.8 ml LV SV MOD A2C:      78.7 ml LV SV MOD A4C:     130.0 ml LV SV MOD BP:      76.0 ml RIGHT VENTRICLE             IVC RV Basal diam:  4.10 cm     IVC diam: 2.20 cm RV S prime:     12.50 cm/s TAPSE (M-mode): 1.5 cm LEFT ATRIUM             Index        RIGHT ATRIUM           Index LA diam:        4.30 cm 1.80 cm/m   RA Area:     19.10 cm LA Vol (A2C):   86.8 ml 36.42 ml/m  RA Volume:   46.30 ml  19.42 ml/m LA Vol (A4C):  81.3 ml 34.11 ml/m LA Biplane Vol: 85.9 ml 36.04 ml/m  AORTIC VALVE LVOT Vmax:   75.50 cm/s LVOT Vmean:  50.000 cm/s LVOT VTI:    0.141 m  AORTA Ao Root diam: 3.20 cm Ao Asc diam:  3.70 cm MITRAL VALVE MV Area (PHT): 4.04 cm    SHUNTS MV Decel Time: 188 msec    Systemic VTI:  0.14 m MV E velocity: 68.60 cm/s  Systemic Diam: 2.20 cm Glori Bickers MD Electronically signed by Glori Bickers MD Signature Date/Time: 04/06/2022/4:35:19 PM    Final    CT Head Wo Contrast  Result Date: 04/05/2022 CLINICAL DATA:  Altered mental status, presyncope, status post brain biopsy 1 week prior. EXAM: CT HEAD WITHOUT CONTRAST TECHNIQUE: Contiguous axial images were obtained from the base of the skull through the vertex without intravenous contrast. RADIATION DOSE REDUCTION: This exam was performed according to the departmental dose-optimization program which includes automated exposure control, adjustment of the mA and/or kV according to patient size and/or use of iterative reconstruction technique. COMPARISON:  03/29/2022 FINDINGS: Brain: Moderate parenchymal volume loss is again seen, stable since prior examination. 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 Punctate foci of pneumocephalus related to biopsy on 03/29/2022 have resolved in the interval. No superimposed acute intracranial hemorrhage. No abnormal mass effect or midline shift. Mild periventricular white matter changes are  present likely reflecting the sequela of small vessel ischemia. Ventricular size is normal. Cerebellum is unremarkable. No acute infarct Vascular: No hyperdense vessel or unexpected calcification. Skull: Normal. Negative for fracture or focal lesion. Sinuses/Orbits: Extensive mucosal thickening is seen throughout the visualized paranasal sinuses with near complete opacification of the visualized left maxillary sinus and layering fluid within the right maxillary sinus. This appears mildly progressive since prior examination. Orbits are unremarkable. Other: Mastoid air cells and middle ear cavities are clear. Surgical skin staples noted within the right frontal scalp overlying craniotomy defect. IMPRESSION: 1. 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 No superimposed acute intracranial hemorrhage. No abnormal mass effect or midline shift. 2. Progressive paranasal sinus disease. Electronically Signed   By: Fidela Salisbury M.D.   On: 04/05/2022 22:18   DG Chest Portable 1 View  Result Date: 04/05/2022 CLINICAL DATA:  Syncope. EXAM: PORTABLE CHEST 1 VIEW COMPARISON:  February 27, 2022. FINDINGS: The heart size and mediastinal contours are within normal limits. Both lungs are clear. The visualized skeletal structures are unremarkable. IMPRESSION: No active disease. Electronically Signed   By: Marijo Conception M.D.   On: 04/05/2022 18:07   CT HEAD WO CONTRAST  Result Date: 03/29/2022 CLINICAL DATA:  Status post frontal biopsy. EXAM: CT HEAD WITHOUT CONTRAST TECHNIQUE: Contiguous axial images were obtained from the base of the skull through the vertex without intravenous contrast. RADIATION DOSE REDUCTION: This exam was performed according to the departmental dose-optimization program which includes automated exposure control, adjustment of the mA and/or kV according to patient size and/or  use of iterative reconstruction technique. COMPARISON:  CT head 02/28/2022, brain MRI 01/29/2022 FINDINGS: Brain: There is pneumocephalus related to the recent right frontal lobe biopsy. There is no evidence of hemorrhage or other complication following biopsy. There is no acute infarct. There is no acute extra-axial fluid collection. Hypodensity in the right anteroinferior frontal lobe corresponding to the expansile  FLAIR signal abnormality seen on prior brain MRI is not significantly changed. Parenchymal volume remains normal for age. The ventricles are stable in size. Background chronic small-vessel ischemic change is stable. There is no mass effect or midline shift. Vascular: There is calcification of the bilateral carotid siphons. Skull: A small biopsy defect is noted in the right frontal bone. Sinuses/Orbits: Chronic left maxillary sinusitis is unchanged. Bilateral lens implants are in place. The globes and orbits are otherwise unremarkable. Other: None. IMPRESSION: No evidence of complication following right frontal lobe lesion biopsy. Electronically Signed   By: Valetta Mole M.D.   On: 03/29/2022 17:42    Pathology: SURGICAL PATHOLOGY CASE: [id number] PATIENT: [facility] Surgical Pathology Report     Clinical History: glioma (cm)     FINAL MICROSCOPIC DIAGNOSIS:  A. BRAIN TUMOR, RIGHT FRONTAL, BIOPSY: Infiltrating glioma, IDH-mutant (outside consultation diagnosis). See comment.  COMMENT: This case is sent to [facility] for consultation.  The note section of the pathology report is quoted as follows:  "Sections reveal an infiltrating glial neoplasm of low to moderate cellularity composed of oligodendroglial-appearing cells with round to oval nuclei, coarse chromatin and moderate pale eosinophilic cytoplasm. Mitotic figures are inconspicuous.  Well-developed microvascular proliferation and necrosis are not seen in this case. Immunostains show  tumor cells are positive for OLIG2 and GFAP.  Mutant IDH1 p. R132H is positive.  ATRX is retained and p53 labels rare atypical cells.  H3k27M is negative and H3K71m3 is mostly retained. The Ki-67 proliferation rate is very low. Overall the features are consistent with a diffuse glioma, IDH mutant, most likely an oligodendroglioma, IDH-mutant, WHO grade 2. NGS testing including assessment for 1p/19q. could be performed if clinically indicated and approved to help with diagnosis and identification of targetable mutations."  The [facility] number is [id number]   GROSS DESCRIPTION: Received fresh are 0.7 x 0.6 x 0.2 cm of soft white tissue.  The specimen is submitted in toto.  ([facility]2/20/2024)  Final Diagnosis performed by JClaudette Laws MD.   Electronically signed 04/11/2022    Assessment/Plan Oligodendroglioma ([facility]  We appreciate the opportunity to participate in the care of HKeonte Salzer  He presents with clinical, radiographic, pathologic syndrome consistent with right frontal astrocytoma, IDH mutant.  No WHO grade was given, but path description is most apt for grade 2.  1p/19q [facility].  We had an extensive conversation with the patient and his family at bedside regarding pathology, prognosis, and available treatment pathways.  We discussed moving forward with radiation and chemotherapy, but will elect for follow up MRI in 1 month prior to initiation of treatment.  This will allow him some time to improved peripheral edema, address orthopedic issues, return to ambulation and full independence.  He is agreeable with this plan.  Decadron should be decreased to '1mg'$  QOD x3 doses, then STOPPED if tolerated.  We encouraged leg elevation, gentle compression, continued diuresis.  Screening for potential clinical trials was performed and discussed using eligibility criteria for [facility], as well  as national database available on [email or web address]    The patient is not a candidate for a research protocol at this time due to poor functional status.   We spent twenty additional minutes teaching regarding the natural history, biology, and historical experience in the treatment of brain tumors. We then discussed in detail the current recommendations for therapy focusing on the mode of administration, mechanism of  action, anticipated toxicities, and quality of life issues associated with this plan. We also provided teaching sheets for the patient to take home as an additional resource.  We ask that Taren Woodlee return to clinic in 1 months following next brain MRI, or sooner as needed.  All questions were answered. The patient knows to call the clinic with any problems, questions or concerns. No barriers to learning were detected.  The total time spent in the encounter was 60 minutes and more than 50% was on counseling and review of test results   Ventura Sellers, MD Medical Director of Neuro-Oncology [facility] 04/21/22 4:50 PM

## 2022-04-26 ENCOUNTER — Encounter (HOSPITAL_COMMUNITY): Payer: Self-pay

## 2022-04-26 ENCOUNTER — Emergency Department (HOSPITAL_COMMUNITY): Payer: Medicare Other

## 2022-04-26 ENCOUNTER — Other Ambulatory Visit: Payer: Self-pay

## 2022-04-26 ENCOUNTER — Inpatient Hospital Stay (HOSPITAL_COMMUNITY)
Admission: EM | Admit: 2022-04-26 | Discharge: 2022-05-04 | DRG: 312 | Disposition: A | Discharge status: Hospice | Payer: Medicare Other | Source: Skilled Nursing Facility | Attending: Family Medicine | Admitting: Family Medicine

## 2022-04-26 DIAGNOSIS — Z6837 Body mass index (BMI) 37.0-37.9, adult: Secondary | ICD-10-CM

## 2022-04-26 DIAGNOSIS — Z8619 Personal history of other infectious and parasitic diseases: Secondary | ICD-10-CM

## 2022-04-26 DIAGNOSIS — E1122 Type 2 diabetes mellitus with diabetic chronic kidney disease: Secondary | ICD-10-CM | POA: Diagnosis present

## 2022-04-26 DIAGNOSIS — Z7901 Long term (current) use of anticoagulants: Secondary | ICD-10-CM

## 2022-04-26 DIAGNOSIS — J449 Chronic obstructive pulmonary disease, unspecified: Secondary | ICD-10-CM | POA: Diagnosis present

## 2022-04-26 DIAGNOSIS — E876 Hypokalemia: Secondary | ICD-10-CM | POA: Diagnosis present

## 2022-04-26 DIAGNOSIS — D496 Neoplasm of unspecified behavior of brain: Secondary | ICD-10-CM | POA: Diagnosis not present

## 2022-04-26 DIAGNOSIS — E860 Dehydration: Secondary | ICD-10-CM | POA: Diagnosis present

## 2022-04-26 DIAGNOSIS — Z79899 Other long term (current) drug therapy: Secondary | ICD-10-CM

## 2022-04-26 DIAGNOSIS — E039 Hypothyroidism, unspecified: Secondary | ICD-10-CM | POA: Diagnosis present

## 2022-04-26 DIAGNOSIS — Z515 Encounter for palliative care: Secondary | ICD-10-CM

## 2022-04-26 DIAGNOSIS — D631 Anemia in chronic kidney disease: Secondary | ICD-10-CM | POA: Diagnosis present

## 2022-04-26 DIAGNOSIS — I4819 Other persistent atrial fibrillation: Secondary | ICD-10-CM | POA: Diagnosis present

## 2022-04-26 DIAGNOSIS — R55 Syncope and collapse: Secondary | ICD-10-CM | POA: Diagnosis not present

## 2022-04-26 DIAGNOSIS — R627 Adult failure to thrive: Secondary | ICD-10-CM | POA: Diagnosis present

## 2022-04-26 DIAGNOSIS — I1310 Hypertensive heart and chronic kidney disease without heart failure, with stage 1 through stage 4 chronic kidney disease, or unspecified chronic kidney disease: Secondary | ICD-10-CM | POA: Diagnosis present

## 2022-04-26 DIAGNOSIS — Z87891 Personal history of nicotine dependence: Secondary | ICD-10-CM

## 2022-04-26 DIAGNOSIS — Z634 Disappearance and death of family member: Secondary | ICD-10-CM

## 2022-04-26 DIAGNOSIS — N400 Enlarged prostate without lower urinary tract symptoms: Secondary | ICD-10-CM | POA: Diagnosis present

## 2022-04-26 DIAGNOSIS — C719 Malignant neoplasm of brain, unspecified: Secondary | ICD-10-CM | POA: Diagnosis present

## 2022-04-26 DIAGNOSIS — I878 Other specified disorders of veins: Secondary | ICD-10-CM | POA: Diagnosis present

## 2022-04-26 DIAGNOSIS — E785 Hyperlipidemia, unspecified: Secondary | ICD-10-CM | POA: Diagnosis present

## 2022-04-26 DIAGNOSIS — N1832 Chronic kidney disease, stage 3b: Secondary | ICD-10-CM | POA: Diagnosis present

## 2022-04-26 DIAGNOSIS — K219 Gastro-esophageal reflux disease without esophagitis: Secondary | ICD-10-CM | POA: Diagnosis present

## 2022-04-26 DIAGNOSIS — E669 Obesity, unspecified: Secondary | ICD-10-CM | POA: Diagnosis present

## 2022-04-26 DIAGNOSIS — E1151 Type 2 diabetes mellitus with diabetic peripheral angiopathy without gangrene: Secondary | ICD-10-CM | POA: Diagnosis present

## 2022-04-26 DIAGNOSIS — J9811 Atelectasis: Secondary | ICD-10-CM | POA: Diagnosis present

## 2022-04-26 DIAGNOSIS — D649 Anemia, unspecified: Principal | ICD-10-CM

## 2022-04-26 DIAGNOSIS — I4891 Unspecified atrial fibrillation: Secondary | ICD-10-CM | POA: Diagnosis present

## 2022-04-26 DIAGNOSIS — T45515A Adverse effect of anticoagulants, initial encounter: Secondary | ICD-10-CM | POA: Diagnosis present

## 2022-04-26 DIAGNOSIS — Z7984 Long term (current) use of oral hypoglycemic drugs: Secondary | ICD-10-CM

## 2022-04-26 DIAGNOSIS — E1165 Type 2 diabetes mellitus with hyperglycemia: Secondary | ICD-10-CM | POA: Diagnosis present

## 2022-04-26 DIAGNOSIS — Z5982 Transportation insecurity: Secondary | ICD-10-CM

## 2022-04-26 DIAGNOSIS — I951 Orthostatic hypotension: Secondary | ICD-10-CM | POA: Diagnosis present

## 2022-04-26 DIAGNOSIS — Z794 Long term (current) use of insulin: Secondary | ICD-10-CM

## 2022-04-26 DIAGNOSIS — R600 Localized edema: Secondary | ICD-10-CM

## 2022-04-26 DIAGNOSIS — F32A Depression, unspecified: Secondary | ICD-10-CM | POA: Diagnosis present

## 2022-04-26 DIAGNOSIS — Z85528 Personal history of other malignant neoplasm of kidney: Secondary | ICD-10-CM

## 2022-04-26 DIAGNOSIS — Z79890 Hormone replacement therapy (postmenopausal): Secondary | ICD-10-CM

## 2022-04-26 DIAGNOSIS — Z888 Allergy status to other drugs, medicaments and biological substances status: Secondary | ICD-10-CM

## 2022-04-26 DIAGNOSIS — E871 Hypo-osmolality and hyponatremia: Secondary | ICD-10-CM | POA: Diagnosis not present

## 2022-04-26 DIAGNOSIS — Z66 Do not resuscitate: Secondary | ICD-10-CM | POA: Diagnosis not present

## 2022-04-26 DIAGNOSIS — R69 Illness, unspecified: Secondary | ICD-10-CM

## 2022-04-26 DIAGNOSIS — N179 Acute kidney failure, unspecified: Secondary | ICD-10-CM | POA: Diagnosis present

## 2022-04-26 DIAGNOSIS — M7989 Other specified soft tissue disorders: Secondary | ICD-10-CM | POA: Diagnosis present

## 2022-04-26 DIAGNOSIS — I872 Venous insufficiency (chronic) (peripheral): Secondary | ICD-10-CM | POA: Diagnosis present

## 2022-04-26 DIAGNOSIS — Z7189 Other specified counseling: Secondary | ICD-10-CM | POA: Diagnosis not present

## 2022-04-26 DIAGNOSIS — Z9181 History of falling: Secondary | ICD-10-CM

## 2022-04-26 LAB — CBC WITH DIFFERENTIAL/PLATELET
Abs Immature Granulocytes: 0.37 10*3/uL — ABNORMAL HIGH (ref 0.00–0.07)
Basophils Absolute: 0 10*3/uL (ref 0.0–0.1)
Basophils Relative: 0 %
Eosinophils Absolute: 0.1 10*3/uL (ref 0.0–0.5)
Eosinophils Relative: 1 %
HCT: 27.7 % — ABNORMAL LOW (ref 39.0–52.0)
Hemoglobin: 8.7 g/dL — ABNORMAL LOW (ref 13.0–17.0)
Immature Granulocytes: 5 %
Lymphocytes Relative: 12 %
Lymphs Abs: 0.9 10*3/uL (ref 0.7–4.0)
MCH: 31.3 pg (ref 26.0–34.0)
MCHC: 31.4 g/dL (ref 30.0–36.0)
MCV: 99.6 fL (ref 80.0–100.0)
Monocytes Absolute: 0.4 10*3/uL (ref 0.1–1.0)
Monocytes Relative: 6 %
Neutro Abs: 6 10*3/uL (ref 1.7–7.7)
Neutrophils Relative %: 76 %
Platelets: 415 10*3/uL — ABNORMAL HIGH (ref 150–400)
RBC: 2.78 MIL/uL — ABNORMAL LOW (ref 4.22–5.81)
RDW: 15.6 % — ABNORMAL HIGH (ref 11.5–15.5)
WBC: 7.8 10*3/uL (ref 4.0–10.5)
nRBC: 0 % (ref 0.0–0.2)

## 2022-04-26 LAB — CBC
HCT: 27.8 % — ABNORMAL LOW (ref 39.0–52.0)
Hemoglobin: 8.8 g/dL — ABNORMAL LOW (ref 13.0–17.0)
MCH: 31.4 pg (ref 26.0–34.0)
MCHC: 31.7 g/dL (ref 30.0–36.0)
MCV: 99.3 fL (ref 80.0–100.0)
Platelets: 451 10*3/uL — ABNORMAL HIGH (ref 150–400)
RBC: 2.8 MIL/uL — ABNORMAL LOW (ref 4.22–5.81)
RDW: 15.4 % (ref 11.5–15.5)
WBC: 8.1 10*3/uL (ref 4.0–10.5)
nRBC: 0.2 % (ref 0.0–0.2)

## 2022-04-26 LAB — COMPREHENSIVE METABOLIC PANEL
ALT: 25 U/L (ref 0–44)
AST: 35 U/L (ref 15–41)
Albumin: 1.9 g/dL — ABNORMAL LOW (ref 3.5–5.0)
Alkaline Phosphatase: 57 U/L (ref 38–126)
Anion gap: 13 (ref 5–15)
BUN: 21 mg/dL (ref 8–23)
CO2: 23 mmol/L (ref 22–32)
Calcium: 6.5 mg/dL — ABNORMAL LOW (ref 8.9–10.3)
Chloride: 102 mmol/L (ref 98–111)
Creatinine, Ser: 2.08 mg/dL — ABNORMAL HIGH (ref 0.61–1.24)
GFR, Estimated: 33 mL/min — ABNORMAL LOW (ref >60)
Glucose, Bld: 151 mg/dL — ABNORMAL HIGH (ref 70–99)
Potassium: 2.7 mmol/L — CL (ref 3.5–5.1)
Sodium: 138 mmol/L (ref 135–145)
Total Bilirubin: 1.3 mg/dL — ABNORMAL HIGH (ref 0.3–1.2)
Total Protein: 5.5 g/dL — ABNORMAL LOW (ref 6.5–8.1)

## 2022-04-26 LAB — TYPE AND SCREEN
ABO/RH(D): A POS
Antibody Screen: NEGATIVE

## 2022-04-26 LAB — GLUCOSE, CAPILLARY
Glucose-Capillary: 126 mg/dL — ABNORMAL HIGH (ref 70–99)
Glucose-Capillary: 129 mg/dL — ABNORMAL HIGH (ref 70–99)

## 2022-04-26 LAB — LACTIC ACID, PLASMA
Lactic Acid, Venous: 1.9 mmol/L (ref 0.5–1.9)
Lactic Acid, Venous: 1.6 mmol/L (ref 0.5–1.9)

## 2022-04-26 LAB — CREATININE, SERUM
Creatinine, Ser: 2.02 mg/dL — ABNORMAL HIGH (ref 0.61–1.24)
GFR, Estimated: 34 mL/min — ABNORMAL LOW (ref >60)

## 2022-04-26 LAB — PROTIME-INR
INR: 1.2 (ref 0.8–1.2)
Prothrombin Time: 14.9 seconds (ref 11.4–15.2)

## 2022-04-26 LAB — CBG MONITORING, ED: Glucose-Capillary: 130 mg/dL — ABNORMAL HIGH (ref 70–99)

## 2022-04-26 LAB — POC OCCULT BLOOD, ED: Fecal Occult Bld: NEGATIVE

## 2022-04-26 LAB — MRSA NEXT GEN BY PCR, NASAL: MRSA by PCR Next Gen: NOT DETECTED

## 2022-04-26 LAB — MAGNESIUM: Magnesium: 1.1 mg/dL — ABNORMAL LOW (ref 1.7–2.4)

## 2022-04-26 LAB — BRAIN NATRIURETIC PEPTIDE: B Natriuretic Peptide: 232.1 pg/mL — ABNORMAL HIGH (ref 0.0–100.0)

## 2022-04-26 LAB — TROPONIN I (HIGH SENSITIVITY)
Troponin I (High Sensitivity): 38 ng/L — ABNORMAL HIGH (ref <18)
Troponin I (High Sensitivity): 40 ng/L — ABNORMAL HIGH (ref <18)

## 2022-04-26 LAB — PHOSPHORUS: Phosphorus: 2.9 mg/dL (ref 2.5–4.6)

## 2022-04-26 MED ORDER — DILTIAZEM LOAD VIA INFUSION
15.0000 mg | Freq: Once | INTRAVENOUS | Status: AC
  Administered 2022-04-26: 15 mg via INTRAVENOUS
  Filled 2022-04-26: qty 15

## 2022-04-26 MED ORDER — METOPROLOL TARTRATE 25 MG PO TABS: 25.0000 mg | ORAL_TABLET | Freq: Two times a day (BID) | ORAL | Status: DC

## 2022-04-26 MED ORDER — CHLORHEXIDINE GLUCONATE CLOTH 2 % EX PADS
6.0000 | MEDICATED_PAD | Freq: Every day | CUTANEOUS | Status: DC
  Administered 2022-04-26: 6 via TOPICAL

## 2022-04-26 MED ORDER — PANTOPRAZOLE SODIUM 40 MG PO TBEC
40.0000 mg | DELAYED_RELEASE_TABLET | Freq: Two times a day (BID) | ORAL | Status: DC
  Administered 2022-04-26 – 2022-05-04 (×16): 40 mg via ORAL
  Filled 2022-04-26 (×16): qty 1

## 2022-04-26 MED ORDER — LEVOTHYROXINE SODIUM 150 MCG PO TABS
150.0000 ug | ORAL_TABLET | Freq: Every day | ORAL | Status: DC
  Administered 2022-04-27 – 2022-05-04 (×8): 150 ug via ORAL
  Filled 2022-04-26 (×8): qty 1

## 2022-04-26 MED ORDER — CARVEDILOL 3.125 MG PO TABS: 1.5625 mg | ORAL_TABLET | Freq: Two times a day (BID) | ORAL | Status: DC

## 2022-04-26 MED ORDER — METOPROLOL TARTRATE 5 MG/5ML IV SOLN
5.0000 mg | Freq: Once | INTRAVENOUS | Status: AC
  Administered 2022-04-26: 5 mg via INTRAVENOUS
  Filled 2022-04-26: qty 5

## 2022-04-26 MED ORDER — LACTATED RINGERS IV BOLUS
500.0000 mL | Freq: Once | INTRAVENOUS | Status: AC
  Administered 2022-04-26: 500 mL via INTRAVENOUS

## 2022-04-26 MED ORDER — RIVAROXABAN 15 MG PO TABS
15.0000 mg | ORAL_TABLET | Freq: Every day | ORAL | Status: DC
  Administered 2022-04-26 – 2022-04-27 (×2): 15 mg via ORAL
  Filled 2022-04-26 (×2): qty 1

## 2022-04-26 MED ORDER — INSULIN GLARGINE-YFGN 100 UNIT/ML ~~LOC~~ SOLN
12.0000 [IU] | Freq: Every day | SUBCUTANEOUS | Status: DC
  Administered 2022-04-26 – 2022-05-03 (×7): 12 [IU] via SUBCUTANEOUS
  Filled 2022-04-26 (×9): qty 0.12

## 2022-04-26 MED ORDER — ENOXAPARIN SODIUM 60 MG/0.6ML IJ SOSY: 60.0000 mg | PREFILLED_SYRINGE | INTRAMUSCULAR | Status: DC

## 2022-04-26 MED ORDER — POTASSIUM CHLORIDE CRYS ER 20 MEQ PO TBCR
40.0000 meq | EXTENDED_RELEASE_TABLET | Freq: Once | ORAL | Status: AC
  Administered 2022-04-26: 40 meq via ORAL
  Filled 2022-04-26: qty 2

## 2022-04-26 MED ORDER — METOPROLOL TARTRATE 25 MG PO TABS
25.0000 mg | ORAL_TABLET | Freq: Two times a day (BID) | ORAL | Status: DC
  Administered 2022-04-27 – 2022-05-04 (×15): 25 mg via ORAL
  Filled 2022-04-26 (×15): qty 1

## 2022-04-26 MED ORDER — INSULIN DETEMIR 100 UNIT/ML ~~LOC~~ SOLN: 12.0000 [IU] | Freq: Every day | SUBCUTANEOUS | Status: DC

## 2022-04-26 MED ORDER — POTASSIUM CHLORIDE 20 MEQ PO PACK
40.0000 meq | PACK | Freq: Once | ORAL | Status: AC
  Administered 2022-04-26: 40 meq via ORAL
  Filled 2022-04-26: qty 2

## 2022-04-26 MED ORDER — POTASSIUM CHLORIDE 10 MEQ/100ML IV SOLN
10.0000 meq | INTRAVENOUS | Status: AC
  Administered 2022-04-26 (×3): 10 meq via INTRAVENOUS
  Filled 2022-04-26 (×3): qty 100

## 2022-04-26 MED ORDER — TAMSULOSIN HCL 0.4 MG PO CAPS
0.4000 mg | ORAL_CAPSULE | Freq: Every day | ORAL | Status: DC
  Administered 2022-04-26 – 2022-05-04 (×9): 0.4 mg via ORAL
  Filled 2022-04-26 (×9): qty 1

## 2022-04-26 MED ORDER — ESCITALOPRAM OXALATE 10 MG PO TABS
10.0000 mg | ORAL_TABLET | Freq: Every day | ORAL | Status: DC
  Administered 2022-04-26 – 2022-05-04 (×9): 10 mg via ORAL
  Filled 2022-04-26 (×9): qty 1

## 2022-04-26 MED ORDER — MAGNESIUM SULFATE 2 GM/50ML IV SOLN
2.0000 g | INTRAVENOUS | Status: AC
  Administered 2022-04-26 (×3): 2 g via INTRAVENOUS
  Filled 2022-04-26 (×3): qty 50

## 2022-04-26 MED ORDER — DILTIAZEM HCL-DEXTROSE 125-5 MG/125ML-% IV SOLN (PREMIX)
5.0000 mg/h | INTRAVENOUS | Status: DC
  Administered 2022-04-26: 5 mg/h via INTRAVENOUS
  Administered 2022-04-27: 12.5 mg/h via INTRAVENOUS
  Filled 2022-04-26 (×2): qty 125

## 2022-04-26 MED ORDER — BENAZEPRIL HCL 20 MG PO TABS: 20.0000 mg | ORAL_TABLET | Freq: Every day | ORAL | Status: DC

## 2022-04-26 MED ORDER — ENOXAPARIN SODIUM 40 MG/0.4ML IJ SOSY: 40.0000 mg | PREFILLED_SYRINGE | INTRAMUSCULAR | Status: DC

## 2022-04-26 MED ORDER — ATORVASTATIN CALCIUM 40 MG PO TABS
40.0000 mg | ORAL_TABLET | Freq: Every day | ORAL | Status: DC
  Administered 2022-04-26 – 2022-05-04 (×9): 40 mg via ORAL
  Filled 2022-04-26 (×9): qty 1

## 2022-04-26 MED ORDER — ORAL CARE MOUTH RINSE: 15.0000 mL | OROMUCOSAL | Status: DC | PRN

## 2022-04-26 MED ORDER — INSULIN ASPART 100 UNIT/ML IJ SOLN
0.0000 [IU] | Freq: Three times a day (TID) | INTRAMUSCULAR | Status: DC
  Administered 2022-04-26: 2 [IU] via SUBCUTANEOUS
  Administered 2022-04-27: 8 [IU] via SUBCUTANEOUS
  Administered 2022-04-27: 4 [IU] via SUBCUTANEOUS
  Administered 2022-04-28: 8 [IU] via SUBCUTANEOUS
  Administered 2022-04-28 (×2): 4 [IU] via SUBCUTANEOUS
  Administered 2022-04-29: 12 [IU] via SUBCUTANEOUS
  Administered 2022-04-29: 2 [IU] via SUBCUTANEOUS
  Administered 2022-04-29: 4 [IU] via SUBCUTANEOUS
  Administered 2022-04-30: 2 [IU] via SUBCUTANEOUS
  Administered 2022-05-01: 4 [IU] via SUBCUTANEOUS
  Administered 2022-05-01 (×2): 2 [IU] via SUBCUTANEOUS
  Administered 2022-05-02: 8 [IU] via SUBCUTANEOUS
  Administered 2022-05-02: 4 [IU] via SUBCUTANEOUS
  Administered 2022-05-02: 2 [IU] via SUBCUTANEOUS
  Administered 2022-05-03: 8 [IU] via SUBCUTANEOUS
  Administered 2022-05-03: 2 [IU] via SUBCUTANEOUS
  Filled 2022-04-26: qty 0.24

## 2022-04-26 MED ORDER — MAGNESIUM SULFATE 2 GM/50ML IV SOLN: 2.0000 g | Freq: Once | INTRAVENOUS | Status: DC

## 2022-04-26 MED ORDER — RIVAROXABAN 15 MG PO TABS: 15.0000 mg | ORAL_TABLET | Freq: Every day | ORAL | Status: DC

## 2022-04-26 NOTE — Progress Notes (Signed)
Spoke with patient regarding CPAP usage.  He refused for tonight as we are unable to offer him nasal pillows.  He instead prefers to sleep without it until he has someone bring in his nasal pillows from home to use with our machine.  I instructed him to have his nurse contact respiratory should he change his mind.

## 2022-04-26 NOTE — H&P (Signed)
History and Physical    Patrick Rocha [id number] DOB: 1945-04-22 DOA: 04/26/2022  PCP: Elliot Dally, MD   Chief Complaint: weakness, fatigue, syncope  HPI: Patrick Rocha is a 77 y.o. male with medical history significant of A-fib status post ablation, type 2 diabetes, hypothyroidism, hypertension brain mass status post biopsy who presents emergency department from his assisted living facility.  Patient was discharged from a skilled nursing facility earlier today.  Upon presentation to the assisted-living facility he was having episodes of passing out feeling lightheaded and collapsed to the floor.  He was transported from the assisted living facility to the emergency department further assessment.  Of note patient was admitted for hypotension on 2/27 for similar presentation.  During this admission he was found to be RSV positive.  He was volume resuscitated.  He was eventually discharged on 3/2.  On arrival to the emergency department he was initially found to have hypotension with systolics in the [id number].  Patient endorsed poor p.o. intake and lack of appetite due to poorly tasting food at his facility.  Labs were obtained on presentation which revealed old sodium 138, potassium 2.7, creatinine 2.08 baseline 1.9, BNP 230, lactic acid 1.9, troponin 38, 40, magnesium 1.1, phosphorus 2.9.  Chest x-ray was obtained which showed left basilar patchy opacities most likely atelectasis.  X-rays of knees showed no evidence of fracture.  CT head showed no acute intracranial abnormality.  Patient was admitted for electrolyte disturbances.  In addition in the ER he had intermittent episodes of A-fib with RVR with rates up to the 30s.  He was placed on diltiazem drip.  On evaluation of patient is resting comfortably.  He is a man of few words.  He stated that he feels lightheaded and things turned black when he stands up.  He is taking 40 mg of Lasix for leg swelling however denies any  prior history of heart failure.  His last echocardiogram was performed on 2/28 which revealed preserved ejection fraction and normal left ventricular function.   Review of Systems: Review of Systems  All other systems reviewed and are negative.    As per HPI otherwise 10 point review of systems negative.   Allergies  Allergen Reactions   Decadron [Dexamethasone] Swelling    Past Medical History:  Diagnosis Date   Atrial fibrillation ([place]) 2004   pt had ablation around 2004 and says he has not went into atrial fibrillation since   Chronic kidney disease    stage 3   Depression    Diabetes mellitus without complication (HCC)    GERD (gastroesophageal reflux disease)    Hx of seasonal allergies    Hypertension 07/20/2010   Hypothyroidism    renal ca    cryoablation left kidney 02/2016   Sleep apnea    uses CPAP    Past Surgical History:  Procedure Laterality Date   IR GENERIC HISTORICAL  02/11/2016   IR RADIOLOGIST EVAL & MGMT 02/11/2016 Corrie Mckusick, DO [facility] INTERV RAD   IR RADIOLOGIST EVAL & MGMT  04/12/2016   IR RADIOLOGIST EVAL & MGMT  12/07/2016   IR RADIOLOGIST EVAL & MGMT  06/06/2017   IR RADIOLOGIST EVAL & MGMT  09/18/2018   IR RADIOLOGIST EVAL & MGMT  10/01/2019   IR RADIOLOGIST EVAL & MGMT  09/08/2020   PR DURAL GRAFT SPINAL Right 03/29/2022   Procedure: FRONTAL FRAMELESS STEREOTACTIC BIOPSY BRAIN WITH STEALTH;  Surgeon: Karsten Ro, DO;  Location: [facility];  Service: Neurosurgery;  Laterality: Right;   radioactive iodine thyroid     pt states this was many years ago     reports that he quit smoking about 30 years ago. His smoking use included cigarettes. He has never used smokeless tobacco. He reports that he does not currently use alcohol. He reports that he does not use drugs.  History reviewed. No pertinent family history.  Prior to Admission medications   Medication Sig Start Date End Date Taking? Authorizing Provider  atorvastatin (LIPITOR) 40 MG  tablet Take 40 mg by mouth daily.   Yes [provider]  benazepril (LOTENSIN) 20 MG tablet Take 1 tablet (20 mg total) by mouth daily. 04/09/22 05/09/22  Dana Allan I, MD  carvedilol (COREG) 3.125 MG tablet Take 0.5 tablets (1.5625 mg total) by mouth 2 (two) times daily with a meal. 04/09/22 05/09/22  Dana Allan I, MD  escitalopram (LEXAPRO) 10 MG tablet Take 10 mg by mouth daily.    [provider]  ferrous sulfate 325 (65 FE) MG tablet Take 325 mg by mouth 2 (two) times daily with a meal.    [provider]  furosemide (LASIX) 20 MG tablet Take 20 mg by mouth daily. ENDS on 04/24/2022 04/15/22   [provider]  glipiZIDE (GLUCOTROL) 10 MG tablet Take 10 mg by mouth daily.    [provider]  LANTUS SOLOSTAR 100 UNIT/ML Solostar Pen Inject 5 Units into the skin daily. Patient taking differently: Inject 20 Units into the skin daily. 04/09/22   Bonnell Public, MD  levothyroxine (SYNTHROID, LEVOTHROID) 150 MCG tablet Take 150 mcg by mouth daily before breakfast.    [provider]  pantoprazole (PROTONIX) 40 MG tablet Take 40 mg by mouth 2 (two) times daily.    [provider]  Rivaroxaban (XARELTO) 15 MG TABS tablet Take 1 tablet (15 mg total) by mouth daily with supper. 04/08/22   Dawley, Troy C, DO  tamsulosin (FLOMAX) 0.4 MG CAPS capsule Take 0.4 mg by mouth daily.    [provider]  tobramycin (TOBREX) 0.3 % ophthalmic solution Place 1 drop into the right eye every 4 (four) hours. 04/15/22   [provider]    Physical Exam: Vitals:   04/26/22 1715 04/26/22 1730 04/26/22 1745 04/26/22 1750  BP: (!) 161/91 (!) 148/77 (!) 145/91   Pulse: (!) 127 (!) 125 (!) 120   Resp: 20 16 10    Temp:    (!) 97.5 F (36.4 C)  TempSrc:    Oral  SpO2: 92% (!) 88% 91%   Weight:      Height:       Physical Exam Vitals reviewed.  Constitutional:      Appearance: He is normal weight.  HENT:     Head: Normocephalic.      Nose: Nose normal.  Eyes:     Conjunctiva/sclera: Conjunctivae normal.     Pupils: Pupils are equal, round, and reactive to light.  Cardiovascular:     Rate and Rhythm: Normal rate and regular rhythm.     Pulses: Normal pulses.  Pulmonary:     Effort: Pulmonary effort is normal. No respiratory distress.  Abdominal:     General: Abdomen is flat. Bowel sounds are normal.  Musculoskeletal:        General: Normal range of motion.  Skin:    General: Skin is warm.     Capillary Refill: Capillary refill takes less than 2 seconds.  Neurological:     General: No focal  deficit present.     Mental Status: He is alert. Mental status is at baseline.         Labs on Admission: I have personally reviewed the patients's labs and imaging studies.  Assessment/Plan Principal Problem:   Failure to thrive in adult Active Problems:   Atrial fibrillation with RVR (HCC)   Syncopal episode most likely due to dehydration in setting of diuretic usage, POA, active - Patient is taking 40 mg of Lasix daily however does not have prior diagnosis of heart failure - Patient has compression stockings - Based on recent echocardiogram performed which shows preserved EF without evidence of diastolic dysfunction Plan: Stop Lasix 40 mg 500 cc IV fluids PT eval morning  A-fib with RVR, poa, active - Patient has known history of A-fib and takes Coreg 12.5 mg twice daily as well as diltiazem 240 mg daily.  Plan to start IV Dilt drip and continue Xarelto -Will hold Coreg and diltiazem p.o. 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  Will plan to trend Hypomagnesemia-IV repletion  Hyperlipidemia-continue Lipitor  Depression-continue escitalopram  GERD-continue Protonix  BPH-continue Flomax  Hypothyroidism-continue Synthroid  Type 2 diabetes-placed on basal bolus regimen    Admission status: Inpatient Stepdown  Certification: The appropriate  patient status for this patient is INPATIENT. Inpatient status is judged to be reasonable and necessary in order to provide the required intensity of service to ensure the patient's safety. The patient's presenting symptoms, physical exam findings, and initial radiographic and laboratory data in the context of their chronic comorbidities is felt to place them at high risk for further clinical deterioration. Furthermore, it is not anticipated that the patient will be medically stable for discharge from the hospital within 2 midnights of admission.   * I certify that at the point of admission it is my clinical judgment that the patient will require inpatient hospital care spanning beyond 2 midnights from the point of admission due to high intensity of service, high risk for further deterioration and high frequency of surveillance required.Patrick Hero MD [facility] If 7PM-7AM, please contact night-coverage [email or web address]  04/26/2022, 6:31 PM

## 2022-04-26 NOTE — Progress Notes (Signed)
Patient admitted to [facility] with phone, glasses and clothes. Alert and oriented x4. Skin assessment completed, but unable to assess bilateral legs due to freshly placed una boots. Per patient they were placed 04/25/22.

## 2022-04-26 NOTE — ED Provider Notes (Signed)
[facility] Provider Note   CSN: [id number] Arrival date & time: 04/26/22  1314     History  Chief Complaint  Patient presents with   Hypotension    Patrick Rocha is a 77 y.o. male.  HPI  Past medical history significant for hypertension, atrial fibrillation on Xarelto, type 2 diabetes mellitus, COPD, CKD3b, OSA on CPAP, brain mass.  Patient reports he has been at a rehab facility.  He has been working on Hotel manager.  He has been up and walking.  He reports he has been having problems with lightheadedness and weakness when walking.  Yesterday he reports he had an episode of collapsing and passing out at the facility.  He had been up and then he got very lightheaded and collapsed to the floor.  Reports he fell on his knees pretty hard.  He does not think he hit his head.  Patient denies he has general headache.  He is anticoagulated chronically on Xarelto for A-fib.  Today the patient was being transported from rehab to SNF.  Upon arriving at facility, patient was found to have blood pressures of 80s over 60s and was lightheaded and near syncopal.  Patient reports that he has been trying to eat and drink as much as he can although has not had much appetite for a while and the food is not good at the facilities.    Home Medications Prior to Admission medications   Medication Sig Start Date End Date Taking? Authorizing Provider  atorvastatin (LIPITOR) 40 MG tablet Take 40 mg by mouth daily.    [provider]  benazepril (LOTENSIN) 20 MG tablet Take 1 tablet (20 mg total) by mouth daily. 04/09/22 05/09/22  Dana Allan I, MD  carvedilol (COREG) 3.125 MG tablet Take 0.5 tablets (1.5625 mg total) by mouth 2 (two) times daily with a meal. 04/09/22 05/09/22  Dana Allan I, MD  escitalopram (LEXAPRO) 10 MG tablet Take 10 mg by mouth daily.    [provider]  ferrous sulfate 325 (65 FE) MG tablet Take 325 mg by  mouth 2 (two) times daily with a meal.    [provider]  furosemide (LASIX) 20 MG tablet Take 20 mg by mouth daily. ENDS on 04/24/2022 04/15/22   [provider]  glipiZIDE (GLUCOTROL) 10 MG tablet Take 10 mg by mouth daily.    [provider]  LANTUS SOLOSTAR 100 UNIT/ML Solostar Pen Inject 5 Units into the skin daily. Patient taking differently: Inject 20 Units into the skin daily. 04/09/22   Bonnell Public, MD  levothyroxine (SYNTHROID, LEVOTHROID) 150 MCG tablet Take 150 mcg by mouth daily before breakfast.    [provider]  pantoprazole (PROTONIX) 40 MG tablet Take 40 mg by mouth 2 (two) times daily.    [provider]  Rivaroxaban (XARELTO) 15 MG TABS tablet Take 1 tablet (15 mg total) by mouth daily with supper. 04/08/22   Dawley, Troy C, DO  tamsulosin (FLOMAX) 0.4 MG CAPS capsule Take 0.4 mg by mouth daily.    [provider]  tobramycin (TOBREX) 0.3 % ophthalmic solution Place 1 drop into the right eye every 4 (four) hours. 04/15/22   [provider]      Allergies    Patient has no known allergies.    Review of Systems   Review of Systems  Physical Exam Updated Vital Signs BP 132/73   Pulse 83   Temp 97.7 F (36.5 C) (  Oral)   Resp 19   Ht 5\' 9"  (1.753 m)   Wt 120 kg   SpO2 95%   BMI 39.07 kg/m  Physical Exam Constitutional:      Comments: Patient is alert.  He is pale in appearance.  No respiratory distress at rest.  HENT:     Mouth/Throat:     Pharynx: Oropharynx is clear.  Eyes:     Extraocular Movements: Extraocular movements intact.  Cardiovascular:     Comments: Tachycardia irregularly irregular. Pulmonary:     Comments: No respiratory distress at rest.  Crackles at the bases of lung fields. Abdominal:     General: There is no distension.     Palpations: Abdomen is soft.     Tenderness: There is no abdominal tenderness. There is no guarding.  Genitourinary:    Comments: Rectal exam: Brown  stool in the vault.  No melena. Musculoskeletal:     Comments: Both lower extremities have dressings and Coban wraps completely encompassing the lower legs and the feet.  Toes are warm and dry without any swelling or significant erythema.  Both knees have superficial abrasions but no effusions or deformities.  Skin:    General: Skin is warm and dry.     Coloration: Skin is pale.  Neurological:     Comments: Patient is awake and answering questions.  He does seem fatigued.  He can help with both upper extremities to pull forward in the stretcher for exam.  Has seemed generally weak and has central obesity.  No focal deficit.     ED Results / Procedures / Treatments   Labs (all labs ordered are listed, but only abnormal results are displayed) Labs Reviewed  COMPREHENSIVE METABOLIC PANEL - Abnormal; Notable for the following components:      Result Value   Potassium 2.7 (*)    Glucose, Bld 151 (*)    Creatinine, Ser 2.08 (*)    Calcium 6.5 (*)    Total Protein 5.5 (*)    Albumin 1.9 (*)    Total Bilirubin 1.3 (*)    GFR, Estimated 33 (*)    All other components within normal limits  BRAIN NATRIURETIC PEPTIDE - Abnormal; Notable for the following components:   B Natriuretic Peptide 232.1 (*)    All other components within normal limits  CBC WITH DIFFERENTIAL/PLATELET - Abnormal; Notable for the following components:   RBC 2.78 (*)    Hemoglobin 8.7 (*)    HCT 27.7 (*)    RDW 15.6 (*)    Platelets 415 (*)    Abs Immature Granulocytes 0.37 (*)    All other components within normal limits  MAGNESIUM - Abnormal; Notable for the following components:   Magnesium 1.1 (*)    All other components within normal limits  TROPONIN I (HIGH SENSITIVITY) - Abnormal; Notable for the following components:   Troponin I (High Sensitivity) 38 (*)    All other components within normal limits  LACTIC ACID, PLASMA  PROTIME-INR  PHOSPHORUS  LACTIC ACID, PLASMA  URINALYSIS, ROUTINE W REFLEX  MICROSCOPIC  POC OCCULT BLOOD, ED  TYPE AND SCREEN  TROPONIN I (HIGH SENSITIVITY)    EKG EKG Interpretation  Date/Time:  Tuesday April 26 2022 13:27:51 EDT Ventricular Rate:  135 PR Interval:    QRS Duration: 133 QT Interval:  340 QTC Calculation: 510 R Axis:   26 Text Interpretation: Atrial fibrillation Nonspecific intraventricular conduction delay afbi and RBBB similar to previous Confirmed by Charlesetta Shanks [phone]) on 04/26/2022  3:32:35 PM  Radiology CT Head Wo Contrast  Result Date: 04/26/2022 CLINICAL DATA:  Head trauma, minor (Age >= 65y) EXAM: CT HEAD WITHOUT CONTRAST TECHNIQUE: Contiguous axial images were obtained from the base of the skull through the vertex without intravenous contrast. RADIATION DOSE REDUCTION: This exam was performed according to the departmental dose-optimization program which includes automated exposure control, adjustment of the mA and/or kV according to patient size and/or use of iterative reconstruction technique. COMPARISON:  CT head February 27, 24. FINDINGS: Brain: Similar hypoattenuation inferior right frontal lobe correlating with abnormal FLAIR signal seen on prior MRI. No evidence of acute large vascular territory infarct, acute hemorrhage, midline shift or hydrocephalus. Vascular: No hyperdense vessel identified. Skull: No acute fracture. Sinuses/Orbits: Paranasal sinus mucosal thickening and partial opacification. No acute orbital findings. Other: No mastoid effusions. IMPRESSION: 1. Similar hypoattenuation inferior right frontal lobe correlating with abnormal FLAIR signal seen on prior MRI. A repeat MRI could allow for more sensitive/direct comparison if clinically warranted. 2. No evidence of new/interval acute abnormality. Electronically Signed   By: Margaretha Sheffield M.D.   On: 04/26/2022 14:38   DG Knee 2 Views Left  Result Date: 04/26/2022 CLINICAL DATA:  Syncope. EXAM: LEFT KNEE - 1-2 VIEW COMPARISON:  None Available. FINDINGS: Minimal  medial compartment joint space narrowing. Mild chronic enthesopathic change at the quadriceps insertion on patella. No joint effusion. No acute fracture is seen. No dislocation. Moderate vascular calcifications. Lateral thigh and knee subcutaneous fat edema appears similar to the contralateral side imaged the same day, likely systemic. IMPRESSION: Minimal medial compartment osteoarthritis.  No acute fracture. Electronically Signed   By: Yvonne Kendall M.D.   On: 04/26/2022 14:34   DG Knee 2 Views Right  Result Date: 04/26/2022 CLINICAL DATA:  Syncope. EXAM: RIGHT KNEE - 1-2 VIEW COMPARISON:  None available FINDINGS: Minimal chronic enthesopathic change at the quadriceps insertion on the patella. There is a 3 mm separated ossicle at the distal tip of the lateral tibial spine, age indeterminate for the sequela of remote trauma versus recent injury. Minimal superior and inferior patellar degenerative osteophytes. Minimal medial compartment joint space narrowing. Minimal medial and lateral compartment chondrocalcinosis. Lateral thigh and knee subcutaneous fat edema appears similar to the contralateral side imaged the same day, likely systemic. IMPRESSION: 1. 3 mm separated ossicle at the distal tip of the lateral tibial spine, age indeterminate for the sequela of remote trauma versus recent injury. Otherwise, no acute fracture is seen. 2. Minimal medial and lateral compartment osteoarthritis. Electronically Signed   By: Yvonne Kendall M.D.   On: 04/26/2022 14:32   DG Chest Port 1 View  Result Date: 04/26/2022 CLINICAL DATA:  Hypotension EXAM: PORTABLE CHEST 1 VIEW COMPARISON:  Chest radiograph dated 04/05/2022 FINDINGS: Normal lung volumes. Left basilar patchy and linear opacities. No pleural effusion or pneumothorax. Similar cardiomediastinal silhouette. The visualized skeletal structures are unremarkable. IMPRESSION: Left basilar patchy and linear opacities, likely atelectasis. Aspiration or pneumonia can be  considered in the appropriate clinical setting. Electronically Signed   By: Darrin Nipper M.D.   On: 04/26/2022 14:30    Procedures Procedures   CRITICAL CARE Performed by: Charlesetta Shanks   Total critical care time: 30 minutes  Critical care time was exclusive of separately billable procedures and treating other patients.  Critical care was necessary to treat or prevent imminent or life-threatening deterioration.  Critical care was time spent personally by me on the following activities: development of treatment plan with patient and/or surrogate as well  as nursing, discussions with consultants, evaluation of patient's response to treatment, examination of patient, obtaining history from patient or surrogate, ordering and performing treatments and interventions, ordering and review of laboratory studies, ordering and review of radiographic studies, pulse oximetry and re-evaluation of patient's condition.  Medications Ordered in ED Medications  potassium chloride SA (KLOR-CON M) CR tablet 40 mEq (has no administration in time range)  potassium chloride 10 mEq in 100 mL IVPB (has no administration in time range)  potassium chloride (KLOR-CON) packet 40 mEq (has no administration in time range)  magnesium sulfate IVPB 2 g 50 mL (has no administration in time range)    ED Course/ Medical Decision Making/ A&P                             Medical Decision Making Amount and/or Complexity of Data Reviewed Labs: ordered. Radiology: ordered.  Risk Decision regarding hospitalization.   Review of EMR indicates patient has significant comorbid conditions and has had recent hospitalizations for hypotension and depletion with AKI.  At this time patient has normotensive blood pressure at rest but rapid atrial fibrillation rates in the 130s.  Clinically he is very deconditioned and weak in appearance.  Will proceed with repeat lab work for metabolic derangement\AKI\dehydration\anemia\ACS.  Patient is  actively anticoagulated lower suspicion for PE but within differential diagnosis.  Reports a collapse and fall yesterday.  He is anticoagulated will also get CT head.  Patient reports falling directly on his knees and having knee pain will obtain x-rays of both knees.  Patient's blood pressures have remained normotensive supine in the bed.  He is persistently tachycardic with atrial fibrillation with 20s to 130s.  Hemoglobin returns at 8.7.  This is anemic from baseline of 10-11.  However, on exam patient does not have any blood in the stool.  No sites of active bleeding identified.  Potassium results at 2.7.  At this time patient has significant hypokalemia that may be contributing factor to rapid A-fib and general weakness.  Will initiate potassium replacement IV and orally.  Patient will require admission for stabilization.  Consult: [facility] Dr. Annie Paras for admission       Final Clinical Impression(s) / ED Diagnoses Final diagnoses:  Symptomatic anemia  Atrial fibrillation, rapid ([place])  Severe comorbid illness  Hypokalemia    Rx / DC Orders ED Discharge Orders     None         Charlesetta Shanks, MD 04/26/22 1535

## 2022-04-26 NOTE — ED Notes (Signed)
ED TO INPATIENT HANDOFF REPORT  Name/Age/Gender Patrick Rocha 77 y.o. male  Code Status    Code Status Orders  (From admission, onward)           Start     Ordered   04/26/22 1532  Full code  Continuous       Question:  By:  Answer:  Consent: discussion documented in EHR   04/26/22 1537           Code Status History     Date Active Date Inactive Code Status Order ID Comments User Context   04/05/2022 2253 04/09/2022 2012 Full Code [id number]  Orene Desanctis, DO ED   03/29/2022 1553 03/30/2022 2129 Full Code [id number]  Karsten Ro, DO Inpatient   02/27/2022 2110 03/04/2022 2002 Full Code [id number]  Clance Boll, MD ED   01/29/2022 0124 01/30/2022 1847 Full Code [id number]  Kristopher Oppenheim, DO ED   01/29/2022 0047 01/29/2022 0124 Full Code [id number]  Kristopher Oppenheim, DO ED      Advance Directive Documentation    Flowsheet Row Most Recent Value  Type of Advance Directive Healthcare Power of Attorney, Living will  Pre-existing out of facility DNR order (yellow form or pink MOST form) --  "MOST" Form in Place? --       Home/SNF/Other Nursing Home  Chief Complaint Failure to thrive in adult [R62.7]  Level of Care/Admitting Diagnosis ED Disposition     ED Disposition  Admit   Condition  --   [place]: [facility] [[id number]]  Level of Care: Med-Surg [16]  May place patient in observation at [facility] or [place] if equivalent level of care is available:: Yes  Covid Evaluation: Asymptomatic - no recent exposure (last 10 days) testing not required  Diagnosis: Failure to thrive in adult [358490]  Admitting Physician: Emilee Hero [id number]  Attending Physician: Emilee Hero [id number]          Medical History Past Medical History:  Diagnosis Date   Atrial fibrillation ([place]) 2004   pt had ablation around 2004 and says he has not went into atrial fibrillation since   Chronic kidney disease    stage 3    Depression    Diabetes mellitus without complication (HCC)    GERD (gastroesophageal reflux disease)    Hx of seasonal allergies    Hypertension 07/20/2010   Hypothyroidism    renal ca    cryoablation left kidney 02/2016   Sleep apnea    uses CPAP    Allergies No Known Allergies  IV Location/Drains/Wounds Patient Lines/Drains/Airways Status     Active Line/Drains/Airways     Name Placement date Placement time Site Days   Peripheral IV 04/26/22 20 G Anterior;Right Forearm 04/26/22  1413  Forearm  less than 1   Peripheral IV 04/26/22 22 G Left;Posterior Hand 04/26/22  1414  Hand  less than 1            Labs/Imaging Results for orders placed or performed during the hospital encounter of 04/26/22 (from the past 48 hour(s))  Comprehensive metabolic panel     Status: Abnormal   Collection Time: 04/26/22  2:10 PM  Result Value Ref Range   Sodium 138 135 - 145 mmol/L   Potassium 2.7 (LL) 3.5 - 5.1 mmol/L    Comment: CRITICAL RESULT CALLED TO, READ BACK BY AND VERIFIED WITH Nikitia Asbill,L. RN AT 1457 04/26/22 MULLINS,T    Chloride 102 98 - 111 mmol/L  CO2 23 22 - 32 mmol/L   Glucose, Bld 151 (H) 70 - 99 mg/dL    Comment: Glucose reference range applies only to samples taken after fasting for at least 8 hours.   BUN 21 8 - 23 mg/dL   Creatinine, Ser 2.08 (H) 0.61 - 1.24 mg/dL   Calcium 6.5 (L) 8.9 - 10.3 mg/dL   Total Protein 5.5 (L) 6.5 - 8.1 g/dL   Albumin 1.9 (L) 3.5 - 5.0 g/dL   AST 35 15 - 41 U/L   ALT 25 0 - 44 U/L   Alkaline Phosphatase 57 38 - 126 U/L   Total Bilirubin 1.3 (H) 0.3 - 1.2 mg/dL   GFR, Estimated 33 (L) >60 mL/min    Comment: (NOTE) Calculated using the CKD-EPI Creatinine Equation (2021)    Anion gap 13 5 - 15    Comment: Performed at [facility], [place] [address] [place], [place] [address]  Brain natriuretic peptide     Status: Abnormal   Collection Time: 04/26/22  2:10 PM  Result Value Ref Range   B Natriuretic Peptide 232.1  (H) 0.0 - 100.0 pg/mL    Comment: Performed at [facility], [place] [address], [place], [place] [address]  Lactic acid, plasma     Status: None   Collection Time: 04/26/22  2:10 PM  Result Value Ref Range   Lactic Acid, Venous 1.9 0.5 - 1.9 mmol/L    Comment: Performed at [facility], [place] [address], [place], [place] [address]  Troponin I (High Sensitivity)     Status: Abnormal   Collection Time: 04/26/22  2:10 PM  Result Value Ref Range   Troponin I (High Sensitivity) 38 (H) <18 ng/L    Comment: (NOTE) Elevated high sensitivity troponin I (hsTnI) values and significant  changes across serial measurements may suggest ACS but many other  chronic and acute conditions are known to elevate hsTnI results.  Refer to the "Links" section for chest pain algorithms and additional  guidance. Performed at [facility], [place] [address] [place], [place] [address]   CBC with Differential     Status: Abnormal   Collection Time: 04/26/22  2:10 PM  Result Value Ref Range   WBC 7.8 4.0 - 10.5 K/uL   RBC 2.78 (L) 4.22 - 5.81 MIL/uL   Hemoglobin 8.7 (L) 13.0 - 17.0 g/dL   HCT 27.7 (L) 39.0 - 52.0 %   MCV 99.6 80.0 - 100.0 fL   MCH 31.3 26.0 - 34.0 pg   MCHC 31.4 30.0 - 36.0 g/dL   RDW 15.6 (H) 11.5 - 15.5 %   Platelets 415 (H) 150 - 400 K/uL   nRBC 0.0 0.0 - 0.2 %   Neutrophils Relative % 76 %   Neutro Abs 6.0 1.7 - 7.7 K/uL   Lymphocytes Relative 12 %   Lymphs Abs 0.9 0.7 - 4.0 K/uL   Monocytes Relative 6 %   Monocytes Absolute 0.4 0.1 - 1.0 K/uL   Eosinophils Relative 1 %   Eosinophils Absolute 0.1 0.0 - 0.5 K/uL   Basophils Relative 0 %   Basophils Absolute 0.0 0.0 - 0.1 K/uL   Immature Granulocytes 5 %   Abs Immature Granulocytes 0.37 (H) 0.00 - 0.07 K/uL    Comment: Performed at [facility], [place] [address] [place], [place] [address]  Protime-INR     Status: None   Collection Time: 04/26/22  2:10 PM  Result  Value  Ref Range   Prothrombin Time 14.9 11.4 - 15.2 seconds   INR 1.2 0.8 - 1.2    Comment: (NOTE) INR goal varies based on device and disease states. Performed at [facility], [place] [address], [place], [place] [address]   Magnesium     Status: Abnormal   Collection Time: 04/26/22  2:10 PM  Result Value Ref Range   Magnesium 1.1 (L) 1.7 - 2.4 mg/dL    Comment: Performed at [facility], [place] [address], [place], [place] [address]  Phosphorus     Status: None   Collection Time: 04/26/22  2:10 PM  Result Value Ref Range   Phosphorus 2.9 2.5 - 4.6 mg/dL    Comment: Performed at [facility], [place] [address], [place],  [address]  POC occult blood, ED     Status: None   Collection Time: 04/26/22  3:06 PM  Result Value Ref Range   Fecal Occult Bld NEGATIVE NEGATIVE   CT Head Wo Contrast  Result Date: 04/26/2022 CLINICAL DATA:  Head trauma, minor (Age >= 65y) EXAM: CT HEAD WITHOUT CONTRAST TECHNIQUE: Contiguous axial images were obtained from the base of the skull through the vertex without intravenous contrast. RADIATION DOSE REDUCTION: This exam was performed according to the departmental dose-optimization program which includes automated exposure control, adjustment of the mA and/or kV according to patient size and/or use of iterative reconstruction technique. COMPARISON:  CT head February 27, 24. FINDINGS: Brain: Similar hypoattenuation inferior right frontal lobe correlating with abnormal FLAIR signal seen on prior MRI. No evidence of acute large vascular territory infarct, acute hemorrhage, midline shift or hydrocephalus. Vascular: No hyperdense vessel identified. Skull: No acute fracture. Sinuses/Orbits: Paranasal sinus mucosal thickening and partial opacification. No acute orbital findings. Other: No mastoid effusions. IMPRESSION: 1. Similar hypoattenuation inferior right frontal lobe correlating with abnormal FLAIR signal seen  on prior MRI. A repeat MRI could allow for more sensitive/direct comparison if clinically warranted. 2. No evidence of new/interval acute abnormality. Electronically Signed   By: Margaretha Sheffield M.D.   On: 04/26/2022 14:38   DG Knee 2 Views Left  Result Date: 04/26/2022 CLINICAL DATA:  Syncope. EXAM: LEFT KNEE - 1-2 VIEW COMPARISON:  None Available. FINDINGS: Minimal medial compartment joint space narrowing. Mild chronic enthesopathic change at the quadriceps insertion on patella. No joint effusion. No acute fracture is seen. No dislocation. Moderate vascular calcifications. Lateral thigh and knee subcutaneous fat edema appears similar to the contralateral side imaged the same day, likely systemic. IMPRESSION: Minimal medial compartment osteoarthritis.  No acute fracture. Electronically Signed   By: Yvonne Kendall M.D.   On: 04/26/2022 14:34   DG Knee 2 Views Right  Result Date: 04/26/2022 CLINICAL DATA:  Syncope. EXAM: RIGHT KNEE - 1-2 VIEW COMPARISON:  None available FINDINGS: Minimal chronic enthesopathic change at the quadriceps insertion on the patella. There is a 3 mm separated ossicle at the distal tip of the lateral tibial spine, age indeterminate for the sequela of remote trauma versus recent injury. Minimal superior and inferior patellar degenerative osteophytes. Minimal medial compartment joint space narrowing. Minimal medial and lateral compartment chondrocalcinosis. Lateral thigh and knee subcutaneous fat edema appears similar to the contralateral side imaged the same day, likely systemic. IMPRESSION: 1. 3 mm separated ossicle at the distal tip of the lateral tibial spine, age indeterminate for the sequela of remote trauma versus recent injury. Otherwise, no acute fracture is seen. 2. Minimal medial and lateral compartment osteoarthritis. Electronically Signed  By: Yvonne Kendall M.D.   On: 04/26/2022 14:32   DG Chest Port 1 View  Result Date: 04/26/2022 CLINICAL DATA:  Hypotension EXAM:  PORTABLE CHEST 1 VIEW COMPARISON:  Chest radiograph dated 04/05/2022 FINDINGS: Normal lung volumes. Left basilar patchy and linear opacities. No pleural effusion or pneumothorax. Similar cardiomediastinal silhouette. The visualized skeletal structures are unremarkable. IMPRESSION: Left basilar patchy and linear opacities, likely atelectasis. Aspiration or pneumonia can be considered in the appropriate clinical setting. Electronically Signed   By: Darrin Nipper M.D.   On: 04/26/2022 14:30    Pending Labs Unresulted Labs (From admission, onward)     Start     Ordered   05/03/22 0500  Creatinine, serum  (enoxaparin (LOVENOX)    CrCl >/= 30 ml/min)  Weekly,   R     Comments: while on enoxaparin therapy    04/26/22 1537   04/26/22 1531  CBC  (enoxaparin (LOVENOX)    CrCl >/= 30 ml/min)  Once,   R       Comments: Baseline for enoxaparin therapy IF NOT ALREADY DRAWN.  Notify MD if PLT < 100 K.    04/26/22 1537   04/26/22 1531  Creatinine, serum  (enoxaparin (LOVENOX)    CrCl >/= 30 ml/min)  Once,   R       Comments: Baseline for enoxaparin therapy IF NOT ALREADY DRAWN.    04/26/22 1537   04/26/22 1453  Type and screen [place]  Once,   STAT       Comments: [place]    04/26/22 1452   04/26/22 1354  Lactic acid, plasma  Now then every 2 hours,   R (with STAT occurrences)      04/26/22 1354   04/26/22 1354  Urinalysis, Routine w reflex microscopic -Urine, Clean Catch  Once,   URGENT       Question:  Specimen Source  Answer:  Urine, Clean Catch   04/26/22 1354            Vitals/Pain Today's Vitals   04/26/22 1324 04/26/22 1326  BP: 132/73   Pulse: 83   Resp: 19   Temp: 97.7 F (36.5 C)   TempSrc: Oral   SpO2: 95%   Weight:  120 kg  Height:  5\' 9"  (1.753 m)  PainSc:  0-No pain    Isolation Precautions No active isolations  Medications Medications  potassium chloride 10 mEq in 100 mL IVPB (10 mEq Intravenous New Bag/Given 04/26/22 1535)   potassium chloride (KLOR-CON) packet 40 mEq (has no administration in time range)  magnesium sulfate IVPB 2 g 50 mL (has no administration in time range)  atorvastatin (LIPITOR) tablet 40 mg (has no administration in time range)  benazepril (LOTENSIN) tablet 20 mg (has no administration in time range)  carvedilol (COREG) tablet 1.5625 mg (has no administration in time range)  escitalopram (LEXAPRO) tablet 10 mg (has no administration in time range)  levothyroxine (SYNTHROID) tablet 150 mcg (has no administration in time range)  pantoprazole (PROTONIX) EC tablet 40 mg (has no administration in time range)  tamsulosin (FLOMAX) capsule 0.4 mg (has no administration in time range)  insulin detemir (LEVEMIR) injection 12 Units (has no administration in time range)  insulin aspart (novoLOG) injection 0-24 Units (has no administration in time range)  enoxaparin (LOVENOX) injection 60 mg (has no administration in time range)  potassium chloride SA (KLOR-CON M) CR tablet 40 mEq (40 mEq Oral Given 04/26/22 1552)  Mobility Not sure, but probably with major assistance.

## 2022-04-26 NOTE — ED Triage Notes (Addendum)
Patient BIB [facility] from [facility]. Patient was a new resident 5 minutes before EMS arrived. Patient felt weak while standing and EMS checked his blood pressure and it was 88/48. Patient has trouble seeing out of both eyes. History of COPD, CHF, a fib.   EMS 22g left forearm 247mL

## 2022-04-27 DIAGNOSIS — R627 Adult failure to thrive: Secondary | ICD-10-CM | POA: Diagnosis not present

## 2022-04-27 LAB — GLUCOSE, CAPILLARY
Glucose-Capillary: 104 mg/dL — ABNORMAL HIGH (ref 70–99)
Glucose-Capillary: 163 mg/dL — ABNORMAL HIGH (ref 70–99)
Glucose-Capillary: 203 mg/dL — ABNORMAL HIGH (ref 70–99)
Glucose-Capillary: 299 mg/dL — ABNORMAL HIGH (ref 70–99)

## 2022-04-27 MED ORDER — CHLORHEXIDINE GLUCONATE CLOTH 2 % EX PADS
6.0000 | MEDICATED_PAD | Freq: Every day | CUTANEOUS | Status: DC
  Administered 2022-04-27 – 2022-05-03 (×7): 6 via TOPICAL

## 2022-04-27 MED ORDER — IBUPROFEN 200 MG PO TABS
600.0000 mg | ORAL_TABLET | ORAL | Status: DC | PRN
  Administered 2022-04-27 – 2022-05-03 (×3): 600 mg via ORAL
  Filled 2022-04-27 (×3): qty 3

## 2022-04-27 MED ORDER — LACTATED RINGERS IV SOLN: INTRAVENOUS | Status: AC

## 2022-04-27 MED ORDER — DILTIAZEM HCL ER 60 MG PO CP12
120.0000 mg | ORAL_CAPSULE | Freq: Two times a day (BID) | ORAL | Status: DC
  Administered 2022-04-27 – 2022-05-04 (×15): 120 mg via ORAL
  Filled 2022-04-27 (×16): qty 2

## 2022-04-27 MED ORDER — ACETAMINOPHEN 325 MG PO TABS
325.0000 mg | ORAL_TABLET | Freq: Four times a day (QID) | ORAL | Status: DC | PRN
  Administered 2022-04-27: 650 mg via ORAL
  Administered 2022-04-27: 325 mg via ORAL
  Administered 2022-04-29 – 2022-05-03 (×4): 650 mg via ORAL
  Filled 2022-04-27 (×8): qty 2

## 2022-04-27 NOTE — TOC Initial Note (Signed)
Transition of Care [facility]) - Initial/Assessment Note    Patient Details  Name: Patrick Rocha MRN: [id number] Date of Birth: 1946-01-27  Transition of Care [facility]) CM/SW Contact:    Roseanne Kaufman, RN Phone Number: 04/27/2022, 6:53 PM  Clinical Narrative:      Per chart review patient from [place], presented to [facility] for hypotension and syncopal episode.   TOC will follow for needs.             Expected Discharge Plan: Assisted Living Barriers to Discharge: Continued Medical Work up   Patient Goals and CMS Choice            Expected Discharge Plan and Services In-house Referral: NA Discharge Planning Services: CM Consult Post Acute Care Choice: Resumption of Svcs/PTA Provider Living arrangements for the past 2 months: Assisted Living Facility                 DME Arranged: N/A DME Agency: NA       HH Arranged: NA [place] Agency: NA        Prior Living Arrangements/Services Living arrangements for the past 2 months: [place]                     Activities of Daily Living Home Assistive Devices/Equipment: Eyeglasses, CPAP, Walker (specify type) ADL Screening (condition at time of admission) Patient's cognitive ability adequate to safely complete daily activities?: Yes Is the patient deaf or have difficulty hearing?: Yes Does the patient have difficulty seeing, even when wearing glasses/contacts?: No Patient able to express need for assistance with ADLs?: Yes  Permission Sought/Granted                  Emotional Assessment           Psych Involvement: No (comment)  Admission diagnosis:  Hypokalemia [E87.6] Failure to thrive in adult [R62.7] Atrial fibrillation with RVR ([place]) [I48.91] Atrial fibrillation, rapid ([place]) [I48.91] Severe comorbid illness [R69] Symptomatic anemia [D64.9] Patient Active Problem List   Diagnosis Date Noted   Failure to thrive in adult 04/26/2022   Atrial fibrillation with RVR ([place])  04/26/2022   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   RSV (respiratory syncytial virus pneumonia) 04/05/2022   Acute renal failure ([place]) 02/28/2022   Paroxysmal atrial fibrillation ([place]) 02/28/2022   Hypothyroidism 02/28/2022   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/2022   Type 2 diabetes mellitus with chronic kidney disease, with long-term current use of insulin ([place]) [id number]   Acute metabolic encephalopathy [id number]   Urinary tract infection without hematuria 02/28/2022   Hypotension 02/27/2022   Dehydration 02/27/2022   Brain mass 01/29/2022   Morbid (severe) obesity due to excess calories ([place]) 01/29/2022   Hypertensive emergency 01/29/2022   Vertigo 01/29/2022   Stage 3b chronic kidney disease (CKD) ([place]) - baseline SCr 1.7-1.9 01/29/2022   COPD (chronic obstructive pulmonary disease) ([place]) 01/16/2018   Left renal mass 03/18/2016   OSA on CPAP 12/10/2015   Benign prostatic hyperplasia 10/20/2010   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   Controlled type 2 diabetes mellitus with stage 3 chronic kidney disease, without long-term current use of insulin ([place]) 07/20/2010   Hypertension 07/20/2010   Persistent atrial fibrillation ([place]) 07/20/2010   Esophageal reflux 12/06/2006   PCP:  Elliot Dally, MD Pharmacy:   [facility], [place] [address] Phone: [phone] Fax: [phone]     Social Determinants of Health (SDOH) Social History: SDOH Screenings  Food Insecurity: No Food Insecurity (04/06/2022)  Housing: Low Risk  (04/06/2022)  Transportation Needs: No Transportation Needs (04/06/2022)  Utilities: Not At Risk (04/06/2022)  Tobacco Use: Medium Risk (04/26/2022)   SDOH Interventions:     Readmission Risk Interventions     No data to display

## 2022-04-27 NOTE — Plan of Care (Signed)
  Problem: Education: Goal: Knowledge of the prescribed therapeutic regimen will improve Outcome: Progressing   Problem: Clinical Measurements: Goal: Usual level of consciousness will be regained or maintained. Outcome: Progressing Goal: Neurologic status will improve Outcome: Progressing Goal: Ability to maintain intracranial pressure will improve Outcome: Progressing   Problem: Skin Integrity: Goal: Demonstration of wound healing without infection will improve Outcome: Progressing   Problem: Education: Goal: Knowledge of General Education information will improve Description: Including pain rating scale, medication(s)/side effects and non-pharmacologic comfort measures Outcome: Progressing   Problem: Health Behavior/Discharge Planning: Goal: Ability to manage health-related needs will improve Outcome: Progressing   Problem: Clinical Measurements: Goal: Ability to maintain clinical measurements within normal limits will improve Outcome: Progressing Goal: Will remain free from infection Outcome: Progressing Goal: Diagnostic test results will improve Outcome: Progressing Goal: Respiratory complications will improve Outcome: Progressing Goal: Cardiovascular complication will be avoided Outcome: Progressing   Problem: Activity: Goal: Risk for activity intolerance will decrease Outcome: Progressing   Problem: Nutrition: Goal: Adequate nutrition will be maintained Outcome: Progressing   Problem: Coping: Goal: Level of anxiety will decrease Outcome: Progressing   Problem: Elimination: Goal: Will not experience complications related to bowel motility Outcome: Progressing Goal: Will not experience complications related to urinary retention Outcome: Progressing   Problem: Pain Managment: Goal: General experience of comfort will improve Outcome: Progressing   Problem: Safety: Goal: Ability to remain free from injury will improve Outcome: Progressing   Problem:  Skin Integrity: Goal: Risk for impaired skin integrity will decrease Outcome: Progressing   

## 2022-04-27 NOTE — Progress Notes (Signed)
PROGRESS NOTE    Patrick Rocha  [id number] DOB: 02-Oct-1945 DOA: 04/26/2022 PCP: Elliot Dally, MD   Brief Narrative:  Patrick Rocha is a 77 y.o. male with medical history significant of A-fib status post ablation, type 2 diabetes, hypothyroidism, hypertension brain mass status post biopsy who presents emergency department from his assisted living facility.  Patient was discharged from a skilled nursing facility the day of admission.  Upon presenting to assisted living facility patient had multiple episodes of syncope, lightheadedness, unsteadiness and collapsed to the floor.  Patient had similar episode 04/05/2022 secondary to hypotension and RSV infection.  Patient again was found to be hypotensive at intake, patient and family indicate poor p.o. intake over the past few days to week.  Patient admitted for further evaluation and workup.  Assessment & Plan:   Principal Problem:   Failure to thrive in adult Active Problems:   Atrial fibrillation with RVR (HCC)   Acute on recurrent episodes of syncope, likely orthostatic  -Patient remains orthostatic today, likely exacerbated by dehydration, poor p.o. intake and ongoing diuretic use. -Patient has been unable to wear compression stockings lately due to increased edema, recent echo shows preserved EF without diastolic dysfunction, patient's edema is likely peripheral vascular disease in nature. -Will order PT OT to follow, patient hopeful he will not have to return to nursing facility for rehab again although safe disposition is the main goal of patient and family per our discussion this morning -Patient received small bolus of IV fluids at intake, discussed increased p.o. intake as appropriate.  A-fib with RVR, asymptomatic, ongoing -Known history of A-fib on carvedilol 12.5 and diltiazem 240 daily -Diltiazem drip initiated at intake - add PO diltiazem -Home medications have been replaced by metoprolol with little  to no improvement in heart rate - will discuss with cardiology given his multiple providers/admissions/medication changes for definitive management of volume status/BP/afibRVR. -Will transition back to p.o. diltiazem and increase dose in hopes to wean diltiazem drip -Continue Xarelto   Polypharmacy, high risk -Patient has had multiple hospitalizations transition to SNF and back to assisted living with apparently multiple episodes of medication changes so much so now that the patient and family are worried patient is receiving the wrong medications at facility given his medication list presented here is different than his prior. Patient is no longer taking benazepril, he has not been given his Synthroid in 2 weeks despite it being active. - There is concern multiple of patient's medications are being inappropriately held or administered.  Will go over med list with patient and family very closely at discharge.  Recommend no other position changes medications until he follows up with his PCP who is well aware of the situation.  Brain mass, recently discovered  -Noted during December 2023 hospitalization.  Patient on steroids at the time, no indication for procedure at that time. -Unclear if patient's mass with 3 mm midline shift is associated with episodes of syncope as above although given orthostatics this is less likely.  Hypothyroidism -Continue Synthroid patient and family report he has not had his Synthroid in over 2 weeks -likely exacerbated above  Hypokalemia -Follow labs, potassium replaced   CKD stage IIIb - at baseline.    Hypomagnesemia- -replaced, follow repeat labs   Hyperlipidemia-continue Lipitor   Depression-continue escitalopram   GERD-continue Protonix   BPH-continue Flomax   Type 2 diabetes, uncontrolled with hyperglycemia -A1c 6.3, continue sliding scale insulin  DVT prophylaxis: Xarelto Code Status: Full Family Communication: Son at  bedside  Status is:  inpt  Dispo: The patient is from: ALF              Anticipated d/c is to: TBD              Anticipated d/c date is: TBD              Patient currently NOT medically stable for discharge  Consultants:  Cardiology  Procedures:  None  Antimicrobials:  None   Subjective: No acute issues or events overnight, patient became markedly orthostatic again today while attempting to ambulate to the bedside chair.  Requiring 2+ assistance to ensure safe transition back to bed.  He denies nausea vomiting diarrhea constipation headache fevers chills or chest pain  Objective: Vitals:   04/27/22 0331 04/27/22 0400 04/27/22 0500 04/27/22 0600  BP:  128/66 (!) 150/71 (!) 131/59  Pulse:  (!) 108 (!) 120 (!) 105  Resp:  20 20 18   Temp: 97.9 F (36.6 C)     TempSrc: Oral Oral    SpO2:  93% 91% 90%  Weight:   114.7 kg   Height:        Intake/Output Summary (Last 24 hours) at 04/27/2022 0714 Last data filed at 04/27/2022 0529 Gross per 24 hour  Intake 970.53 ml  Output 400 ml  Net 570.53 ml   Filed Weights   04/26/22 1326 04/26/22 2100 04/27/22 0500  Weight: 120 kg 117.8 kg 114.7 kg    Examination:  General:  Pleasantly resting in bed, No acute distress. HEENT:  Normocephalic atraumatic.  Sclerae nonicteric, noninjected.  Extraocular movements intact bilaterally. Neck:  Without mass or deformity.  Trachea is midline. Lungs:  Clear to auscultate bilaterally without rhonchi, wheeze, or rales. Heart: Irregularly irregular without overt murmurs rubs Abdomen:  Soft, nontender, nondistended.  Without guarding or rebound. Extremities: Without cyanosis, clubbing, 2+ pitting edema bilateral lower extremity.  Data Reviewed: I have personally reviewed following labs and imaging studies  CBC: Recent Labs  Lab 04/26/22 1410 04/26/22 1623  WBC 7.8 8.1  NEUTROABS 6.0  --   HGB 8.7* 8.8*  HCT 27.7* 27.8*  MCV 99.6 99.3  PLT 415* [id number]*   Basic Metabolic Panel: Recent Labs  Lab  04/26/22 1410 04/26/22 1623  NA 138  --   K 2.7*  --   CL 102  --   CO2 23  --   GLUCOSE 151*  --   BUN 21  --   CREATININE 2.08* 2.02*  CALCIUM 6.5*  --   MG 1.1*  --   PHOS 2.9  --    GFR: Estimated Creatinine Clearance: 39.5 mL/min (A) (by C-G formula based on SCr of 2.02 mg/dL (H)). Liver Function Tests: Recent Labs  Lab 04/26/22 1410  AST 35  ALT 25  ALKPHOS 57  BILITOT 1.3*  PROT 5.5*  ALBUMIN 1.9*   Coagulation Profile: Recent Labs  Lab 04/26/22 1410  INR 1.2   CBG: Recent Labs  Lab 04/26/22 1659 04/26/22 2109 04/26/22 2313  GLUCAP 130* 126* 129*   Sepsis Labs: Recent Labs  Lab 04/26/22 1410 04/26/22 1623  LATICACIDVEN 1.9 1.6    Recent Results (from the past 240 hour(s))  MRSA Next Gen by PCR, Nasal     Status: None   Collection Time: 04/26/22  6:20 PM   Specimen: Nasal Mucosa; Nasal Swab  Result Value Ref Range Status   MRSA by PCR Next Gen NOT DETECTED NOT DETECTED Final    Comment: (NOTE)  The GeneXpert MRSA Assay (FDA approved for NASAL specimens only), is one component of a comprehensive MRSA colonization surveillance program. It is not intended to diagnose MRSA infection nor to guide or monitor treatment for MRSA infections. Test performance is not FDA approved in patients less than 16 years old. Performed at [facility], [place] [address], [place], [place] [id number]          Radiology Studies: CT Head Wo Contrast  Result Date: 04/26/2022 CLINICAL DATA:  Head trauma, minor (Age >= 65y) EXAM: CT HEAD WITHOUT CONTRAST TECHNIQUE: Contiguous axial images were obtained from the base of the skull through the vertex without intravenous contrast. RADIATION DOSE REDUCTION: This exam was performed according to the departmental dose-optimization program which includes automated exposure control, adjustment of the mA and/or kV according to patient size and/or use of iterative reconstruction technique. COMPARISON:  CT head  February 27, 24. FINDINGS: Brain: Similar hypoattenuation inferior right frontal lobe correlating with abnormal FLAIR signal seen on prior MRI. No evidence of acute large vascular territory infarct, acute hemorrhage, midline shift or hydrocephalus. Vascular: No hyperdense vessel identified. Skull: No acute fracture. Sinuses/Orbits: Paranasal sinus mucosal thickening and partial opacification. No acute orbital findings. Other: No mastoid effusions. IMPRESSION: 1. Similar hypoattenuation inferior right frontal lobe correlating with abnormal FLAIR signal seen on prior MRI. A repeat MRI could allow for more sensitive/direct comparison if clinically warranted. 2. No evidence of new/interval acute abnormality. Electronically Signed   By: Margaretha Sheffield M.D.   On: 04/26/2022 14:38   DG Knee 2 Views Left  Result Date: 04/26/2022 CLINICAL DATA:  Syncope. EXAM: LEFT KNEE - 1-2 VIEW COMPARISON:  None Available. FINDINGS: Minimal medial compartment joint space narrowing. Mild chronic enthesopathic change at the quadriceps insertion on patella. No joint effusion. No acute fracture is seen. No dislocation. Moderate vascular calcifications. Lateral thigh and knee subcutaneous fat edema appears similar to the contralateral side imaged the same day, likely systemic. IMPRESSION: Minimal medial compartment osteoarthritis.  No acute fracture. Electronically Signed   By: Yvonne Kendall M.D.   On: 04/26/2022 14:34   DG Knee 2 Views Right  Result Date: 04/26/2022 CLINICAL DATA:  Syncope. EXAM: RIGHT KNEE - 1-2 VIEW COMPARISON:  None available FINDINGS: Minimal chronic enthesopathic change at the quadriceps insertion on the patella. There is a 3 mm separated ossicle at the distal tip of the lateral tibial spine, age indeterminate for the sequela of remote trauma versus recent injury. Minimal superior and inferior patellar degenerative osteophytes. Minimal medial compartment joint space narrowing. Minimal medial and lateral  compartment chondrocalcinosis. Lateral thigh and knee subcutaneous fat edema appears similar to the contralateral side imaged the same day, likely systemic. IMPRESSION: 1. 3 mm separated ossicle at the distal tip of the lateral tibial spine, age indeterminate for the sequela of remote trauma versus recent injury. Otherwise, no acute fracture is seen. 2. Minimal medial and lateral compartment osteoarthritis. Electronically Signed   By: Yvonne Kendall M.D.   On: 04/26/2022 14:32   DG Chest Port 1 View  Result Date: 04/26/2022 CLINICAL DATA:  Hypotension EXAM: PORTABLE CHEST 1 VIEW COMPARISON:  Chest radiograph dated 04/05/2022 FINDINGS: Normal lung volumes. Left basilar patchy and linear opacities. No pleural effusion or pneumothorax. Similar cardiomediastinal silhouette. The visualized skeletal structures are unremarkable. IMPRESSION: Left basilar patchy and linear opacities, likely atelectasis. Aspiration or pneumonia can be considered in the appropriate clinical setting. Electronically Signed   By: Darrin Nipper M.D.   On: 04/26/2022 14:30  Scheduled Meds:  atorvastatin  40 mg Oral Daily   Chlorhexidine Gluconate Cloth  6 each Topical Daily   escitalopram  10 mg Oral Daily   insulin aspart  0-24 Units Subcutaneous TID WC   insulin glargine-yfgn  12 Units Subcutaneous QHS   levothyroxine  150 mcg Oral QAC breakfast   metoprolol tartrate  25 mg Oral BID   pantoprazole  40 mg Oral BID   Rivaroxaban  15 mg Oral Q supper   tamsulosin  0.4 mg Oral Daily   Continuous Infusions:  diltiazem (CARDIZEM) infusion 7.5 mg/hr (04/27/22 0637)     LOS: 1 day   Time spent: 80min  Jeramy Dimmick C Dolce Sylvia, DO [facility]  If 7PM-7AM, please contact night-coverage [email or web address]  04/27/2022, 7:14 AM

## 2022-04-28 DIAGNOSIS — I951 Orthostatic hypotension: Secondary | ICD-10-CM

## 2022-04-28 DIAGNOSIS — R55 Syncope and collapse: Secondary | ICD-10-CM

## 2022-04-28 DIAGNOSIS — R627 Adult failure to thrive: Secondary | ICD-10-CM | POA: Diagnosis not present

## 2022-04-28 DIAGNOSIS — R600 Localized edema: Secondary | ICD-10-CM

## 2022-04-28 LAB — CBC
HCT: 23.4 % — ABNORMAL LOW (ref 39.0–52.0)
Hemoglobin: 7.6 g/dL — ABNORMAL LOW (ref 13.0–17.0)
MCH: 32.2 pg (ref 26.0–34.0)
MCHC: 32.5 g/dL (ref 30.0–36.0)
MCV: 99.2 fL (ref 80.0–100.0)
Platelets: 405 10*3/uL — ABNORMAL HIGH (ref 150–400)
RBC: 2.36 MIL/uL — ABNORMAL LOW (ref 4.22–5.81)
RDW: 15.8 % — ABNORMAL HIGH (ref 11.5–15.5)
WBC: 7.3 10*3/uL (ref 4.0–10.5)
nRBC: 0 % (ref 0.0–0.2)

## 2022-04-28 LAB — BASIC METABOLIC PANEL
Anion gap: 7 (ref 5–15)
BUN: 24 mg/dL — ABNORMAL HIGH (ref 8–23)
CO2: 27 mmol/L (ref 22–32)
Calcium: 6.9 mg/dL — ABNORMAL LOW (ref 8.9–10.3)
Chloride: 101 mmol/L (ref 98–111)
Creatinine, Ser: 1.83 mg/dL — ABNORMAL HIGH (ref 0.61–1.24)
GFR, Estimated: 38 mL/min — ABNORMAL LOW (ref >60)
Glucose, Bld: 254 mg/dL — ABNORMAL HIGH (ref 70–99)
Potassium: 3.2 mmol/L — ABNORMAL LOW (ref 3.5–5.1)
Sodium: 135 mmol/L (ref 135–145)

## 2022-04-28 LAB — GLUCOSE, CAPILLARY
Glucose-Capillary: 181 mg/dL — ABNORMAL HIGH (ref 70–99)
Glucose-Capillary: 244 mg/dL — ABNORMAL HIGH (ref 70–99)
Glucose-Capillary: 175 mg/dL — ABNORMAL HIGH (ref 70–99)
Glucose-Capillary: 231 mg/dL — ABNORMAL HIGH (ref 70–99)

## 2022-04-28 LAB — MAGNESIUM: Magnesium: 2.5 mg/dL — ABNORMAL HIGH (ref 1.7–2.4)

## 2022-04-28 MED ORDER — POTASSIUM CHLORIDE CRYS ER 20 MEQ PO TBCR
30.0000 meq | EXTENDED_RELEASE_TABLET | Freq: Once | ORAL | Status: AC
  Administered 2022-04-28: 30 meq via ORAL
  Filled 2022-04-28: qty 1

## 2022-04-28 NOTE — Evaluation (Signed)
Physical Therapy Evaluation Patient Details Name: Patrick Rocha MRN: [id number] DOB: 1945-05-31 Today's Date: 04/28/2022  History of Present Illness  Patrick Rocha is a 77 y.o. male with medical history significant of A-fib status post ablation, type 2 diabetes, hypothyroidism, hypertension brain mass status post biopsy who presents emergency department from his assisted living facility.  Patient was discharged from a skilled nursing facility the day of admission.  Upon presenting to assisted living facility patient had multiple episodes of syncope, lightheadedness, unsteadiness and collapsed to the floor. Recently in hospital for similar  episode.  Clinical Impression  Pt admitted with above diagnosis.  Pt currently with functional limitations due to the deficits listed below (see PT Problem List). Pt will benefit from skilled PT to increase their independence and safety with mobility to allow discharge to the venue listed below.     The patient was just DC'd from SNF to ALF and back to hospital. Patient reports that he could "walk around the place" prior to DC from SNF.  Patient  did stand and  take steps to recliner using RW and  2 person for safety.  Hopefully patient will progress to return to ALF .Will have to see how he progresses.    Recommendations for follow up therapy are one component of a multi-disciplinary discharge planning process, led by the attending physician.  Recommendations may be updated based on patient status, additional functional criteria and insurance authorization.  Follow Up Recommendations Home health PT (return to ALF) vs return to SNF. Can patient physically be transported by private vehicle: No    Assistance Recommended at Discharge Frequent or constant Supervision/Assistance  Patient can return home with the following  A lot of help with walking and/or transfers;A lot of help with bathing/dressing/bathroom;Assistance with  cooking/housework;Assist for transportation;Help with stairs or ramp for entrance    Equipment Recommendations None recommended by PT  Recommendations for Other Services       Functional Status Assessment Patient has had a recent decline in their functional status and demonstrates the ability to make significant improvements in function in a reasonable and predictable amount of time.     Precautions / Restrictions Precautions Precautions: Fall Precaution Comments: monitor BP, reports dizziness but this is usual      Mobility  Bed Mobility Overal bed mobility: Needs Assistance Bed Mobility: Supine to Sit     Supine to sit: Supervision, HOB elevated     General bed mobility comments: Increased time and use of bed rail but no physical assist    Transfers Overall transfer level: Needs assistance Equipment used: Rolling walker (2 wheels) Transfers: Sit to/from Stand Sit to Stand: Min assist, +2 safety/equipment           General transfer comment: stand and pivot steps to recliner. cues for posture, able  to take several steps t recliner.    Ambulation/Gait                  Stairs            Wheelchair Mobility    Modified Rankin (Stroke Patients Only)       Balance Overall balance assessment: Needs assistance, History of Falls Sitting-balance support: Feet supported Sitting balance-Leahy Scale: Fair     Standing balance support: During functional activity, Reliant on assistive device for balance, Bilateral upper extremity supported Standing balance-Leahy Scale: Poor  Pertinent Vitals/Pain Pain Assessment Faces Pain Scale: Hurts little more Pain Location: top of head-"The tumor" Pain Descriptors / Indicators: Discomfort, Headache Pain Intervention(s): Monitored during session    Home Living Family/patient expects to be discharged to:: Assisted living   Available Help at Discharge: Available  PRN/intermittently Type of Home: Assisted living Home Access: Level entry           Additional Comments: reorts DC'd from SNF day of adm.    Prior Function Prior Level of Function : Needs assist             Mobility Comments: pt reports using RW  and could walk the halls at the SNF.       Hand Dominance   Dominant Hand: Right    Extremity/Trunk Assessment        Lower Extremity Assessment Lower Extremity Assessment: Generalized weakness (legs have dressings intact)    Cervical / Trunk Assessment Cervical / Trunk Assessment: Normal  Communication   Communication: No difficulties  Cognition Arousal/Alertness: Awake/alert Behavior During Therapy: Flat affect Overall Cognitive Status: Within Functional Limits for tasks assessed                                          General Comments      Exercises     Assessment/Plan    PT Assessment Patient needs continued PT services  PT Problem List Decreased strength;Decreased activity tolerance;Decreased mobility;Decreased knowledge of use of DME;Decreased safety awareness;Decreased balance;Impaired sensation;Obesity       PT Treatment Interventions DME instruction;Gait training;Functional mobility training;Therapeutic activities;Therapeutic exercise;Balance training;Neuromuscular re-education;Patient/family education    PT Goals (Current goals can be found in the Care Plan section)  Acute Rehab PT Goals Patient Stated Goal: to go back to his  home PT Goal Formulation: With patient Time For Goal Achievement: 05/12/22 Potential to Achieve Goals: Good    Frequency Min 3X/week     Co-evaluation               AM-PAC PT "6 Clicks" Mobility  Outcome Measure Help needed turning from your back to your side while in a flat bed without using bedrails?: A Little Help needed moving from lying on your back to sitting on the side of a flat bed without using bedrails?: A Little Help needed moving  to and from a bed to a chair (including a wheelchair)?: A Little Help needed standing up from a chair using your arms (e.g., wheelchair or bedside chair)?: A Little Help needed to walk in hospital room?: Total Help needed climbing 3-5 steps with a railing? : Total 6 Click Score: 14    End of Session Equipment Utilized During Treatment: Gait belt Activity Tolerance: Patient tolerated treatment well Patient left: in chair;with call bell/phone within reach;with chair alarm set;with nursing/sitter in room Nurse Communication: Patient requests pain meds PT Visit Diagnosis: Difficulty in walking, not elsewhere classified (R26.2)    Time: [id number] PT Time Calculation (min) (ACUTE ONLY): 15 min   Charges:   PT Evaluation $PT Eval Low Complexity: 1 Low          [place] Office [phone] Weekend [phone]   Claretha Cooper 04/28/2022, 11:05 AM

## 2022-04-28 NOTE — Progress Notes (Signed)
PROGRESS NOTE    Patrick Rocha  [id number] DOB: 12/29/1945 DOA: 04/26/2022 PCP: Elliot Dally, MD   Brief Narrative:  Patrick Rocha is a 77 y.o. male with medical history significant of A-fib status post ablation, type 2 diabetes, hypothyroidism, hypertension brain mass status post biopsy who presents emergency department from his assisted living facility.  Patient was discharged from a skilled nursing facility the day of admission.  Upon presenting to assisted living facility patient had multiple episodes of syncope, lightheadedness, unsteadiness and collapsed to the floor.  Patient had similar episode 04/05/2022 secondary to hypotension and RSV infection.  Patient again was found to be hypotensive at intake, patient and family indicate poor p.o. intake over the past few days to week.  Patient admitted for further evaluation and workup.  Assessment & Plan:   Principal Problem:   Failure to thrive in adult Active Problems:   Atrial fibrillation with RVR (HCC)   Acute on recurrent episodes of syncope, likely orthostatic  -Patient remains orthostatic today, likely exacerbated by dehydration, poor p.o. intake and ongoing diuretic use. -Patient has been unable to wear compression stockings lately due to increased edema, recent echo shows preserved EF without diastolic dysfunction, patient's edema is likely peripheral vascular disease in nature. -Will order PT OT to follow, patient hopeful he will not have to return to nursing facility for rehab again although safe disposition is the main goal of patient and family per our discussion this morning -Patient received small bolus of IV fluids at intake, discussed increased p.o. intake as appropriate. -Appreciate cardiology's help managing BP, Afib, and orthostatic hypotension. Patient has had multiple medication changes over the past month between different primary providers,  A-fib with RVR, asymptomatic, ongoing, rate  improved -Known history of A-fib on carvedilol 12.5 and diltiazem 240 daily -Diltiazem drip discontinued - titrating diltiazem as appropriate -Consider amiodarone per cardiology if rate control continues to be an issue -Home medications have been replaced by metoprolol with little to no improvement in heart rate - appreciate cardiology's intervention given his multiple providers/admissions/medication changes for definitive management of volume status/BP/afibRVR. -Continue Xarelto given noted anemia (no signs/sources for bleeding).   Polypharmacy, high risk -Patient has had multiple hospitalizations transition to SNF and back to assisted living with apparently multiple episodes of medication changes so much so now that the patient and family are worried patient is receiving the wrong medications at facility given his medication list presented here is different than his prior. Patient is no longer taking benazepril, he has not been given his Synthroid in 2 weeks despite it being active. - There is concern multiple of patient's medications are being inappropriately held or administered.  Will go over med list with patient and family very closely at discharge.  Recommend no other medication changes after discharge until he follows up with his PCP who is well aware of the situation.  Brain mass, recently discovered  -Noted during December 2023 hospitalization.  Patient on steroids at the time, no indication for procedure at that time. -Unclear if patient's mass with 3 mm midline shift is associated with episodes of syncope as above although given orthostatics this is less likely.  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    Hypomagnesemia- -replaced, follow repeat labs   Hyperlipidemia-continue Lipitor   Depression-continue  escitalopram   GERD-continue Protonix  BPH-continue Flomax   Type 2 diabetes, uncontrolled with hyperglycemia -A1c 6.3, continue sliding scale insulin/hypoglycemic protocol  DVT prophylaxis: Xarelto currently being held Code Status: Full Family Communication: Son at bedside  Status is: inpt  Dispo: The patient is from: ALF              Anticipated d/c is to: TBD              Anticipated d/c date is: TBD              Patient currently NOT medically stable for discharge  Consultants:  Cardiology  Procedures:  None  Antimicrobials:  None   Subjective: No acute issues or events overnight, patient became markedly orthostatic again today while attempting to ambulate to the bedside chair.  Requiring 2+ assistance to ensure safe transition back to bed.  He denies nausea vomiting diarrhea constipation headache fevers chills or chest pain  Objective: Vitals:   04/28/22 0335 04/28/22 0400 04/28/22 0500 04/28/22 0600  BP:  (!) 112/59 (!) 101/58 (!) 110/59  Pulse:  75 70 78  Resp:  11 11 10   Temp: 98.3 F (36.8 C)     TempSrc: Oral     SpO2:  91% 95% 92%  Weight:      Height:        Intake/Output Summary (Last 24 hours) at 04/28/2022 0658 Last data filed at 04/28/2022 0400 Gross per 24 hour  Intake 1047.8 ml  Output 700 ml  Net 347.8 ml    Filed Weights   04/26/22 1326 04/26/22 2100 04/27/22 0500  Weight: 120 kg 117.8 kg 114.7 kg    Examination:  General:  Pleasantly resting in bed, No acute distress. HEENT:  Normocephalic atraumatic.  Sclerae nonicteric, noninjected.  Extraocular movements intact bilaterally. Neck:  Without mass or deformity.  Trachea is midline. Lungs:  Clear to auscultate bilaterally without rhonchi, wheeze, or rales. Heart: Irregularly irregular without overt murmurs rubs Abdomen:  Soft, nontender, nondistended.  Without guarding or rebound. Extremities: Without cyanosis, clubbing, 2+ pitting edema bilateral lower extremity.  Data Reviewed:  I have personally reviewed following labs and imaging studies  CBC: Recent Labs  Lab 04/26/22 1410 04/26/22 1623 04/28/22 0252  WBC 7.8 8.1 7.3  NEUTROABS 6.0  --   --   HGB 8.7* 8.8* 7.6*  HCT 27.7* 27.8* 23.4*  MCV 99.6 99.3 99.2  PLT 415* 451* 405*    Basic Metabolic Panel: Recent Labs  Lab 04/26/22 1410 04/26/22 1623 04/28/22 0252  NA 138  --  135  K 2.7*  --  3.2*  CL 102  --  101  CO2 23  --  27  GLUCOSE 151*  --  254*  BUN 21  --  24*  CREATININE 2.08* 2.02* 1.83*  CALCIUM 6.5*  --  6.9*  MG 1.1*  --  2.5*  PHOS 2.9  --   --     GFR: Estimated Creatinine Clearance: 43.6 mL/min (A) (by C-G formula based on SCr of 1.83 mg/dL (H)). Liver Function Tests: Recent Labs  Lab 04/26/22 1410  AST 35  ALT 25  ALKPHOS 57  BILITOT 1.3*  PROT 5.5*  ALBUMIN 1.9*    Coagulation Profile: Recent Labs  Lab 04/26/22 1410  INR 1.2    CBG: Recent Labs  Lab 04/26/22 2313 04/27/22 0725 04/27/22 1126 04/27/22 1630 04/27/22 2151  GLUCAP 129* 104* 163* 203* 299*    Sepsis Labs: Recent Labs  Lab 04/26/22 1410 04/26/22 1623  LATICACIDVEN  1.9 1.6     Recent Results (from the past 240 hour(s))  MRSA Next Gen by PCR, Nasal     Status: None   Collection Time: 04/26/22  6:20 PM   Specimen: Nasal Mucosa; Nasal Swab  Result Value Ref Range Status   MRSA by PCR Next Gen NOT DETECTED NOT DETECTED Final    Comment: (NOTE) The GeneXpert MRSA Assay (FDA approved for NASAL specimens only), is one component of a comprehensive MRSA colonization surveillance program. It is not intended to diagnose MRSA infection nor to guide or monitor treatment for MRSA infections. Test performance is not FDA approved in patients less than 87 years old. Performed at [facility], [place] [address] [place], [place] [address]          Radiology Studies: CT Head Wo Contrast  Result Date: 04/26/2022 CLINICAL DATA:  Head trauma, minor (Age >= 65y) EXAM: CT HEAD  WITHOUT CONTRAST TECHNIQUE: Contiguous axial images were obtained from the base of the skull through the vertex without intravenous contrast. RADIATION DOSE REDUCTION: This exam was performed according to the departmental dose-optimization program which includes automated exposure control, adjustment of the mA and/or kV according to patient size and/or use of iterative reconstruction technique. COMPARISON:  CT head February 27, 24. FINDINGS: Brain: Similar hypoattenuation inferior right frontal lobe correlating with abnormal FLAIR signal seen on prior MRI. No evidence of acute large vascular territory infarct, acute hemorrhage, midline shift or hydrocephalus. Vascular: No hyperdense vessel identified. Skull: No acute fracture. Sinuses/Orbits: Paranasal sinus mucosal thickening and partial opacification. No acute orbital findings. Other: No mastoid effusions. IMPRESSION: 1. Similar hypoattenuation inferior right frontal lobe correlating with abnormal FLAIR signal seen on prior MRI. A repeat MRI could allow for more sensitive/direct comparison if clinically warranted. 2. No evidence of new/interval acute abnormality. Electronically Signed   By: Margaretha Sheffield M.D.   On: 04/26/2022 14:38   DG Knee 2 Views Left  Result Date: 04/26/2022 CLINICAL DATA:  Syncope. EXAM: LEFT KNEE - 1-2 VIEW COMPARISON:  None Available. FINDINGS: Minimal medial compartment joint space narrowing. Mild chronic enthesopathic change at the quadriceps insertion on patella. No joint effusion. No acute fracture is seen. No dislocation. Moderate vascular calcifications. Lateral thigh and knee subcutaneous fat edema appears similar to the contralateral side imaged the same day, likely systemic. IMPRESSION: Minimal medial compartment osteoarthritis.  No acute fracture. Electronically Signed   By: Yvonne Kendall M.D.   On: 04/26/2022 14:34   DG Knee 2 Views Right  Result Date: 04/26/2022 CLINICAL DATA:  Syncope. EXAM: RIGHT KNEE - 1-2 VIEW  COMPARISON:  None available FINDINGS: Minimal chronic enthesopathic change at the quadriceps insertion on the patella. There is a 3 mm separated ossicle at the distal tip of the lateral tibial spine, age indeterminate for the sequela of remote trauma versus recent injury. Minimal superior and inferior patellar degenerative osteophytes. Minimal medial compartment joint space narrowing. Minimal medial and lateral compartment chondrocalcinosis. Lateral thigh and knee subcutaneous fat edema appears similar to the contralateral side imaged the same day, likely systemic. IMPRESSION: 1. 3 mm separated ossicle at the distal tip of the lateral tibial spine, age indeterminate for the sequela of remote trauma versus recent injury. Otherwise, no acute fracture is seen. 2. Minimal medial and lateral compartment osteoarthritis. Electronically Signed   By: Yvonne Kendall M.D.   On: 04/26/2022 14:32   DG Chest Port 1 View  Result Date: 04/26/2022 CLINICAL DATA:  Hypotension EXAM: PORTABLE CHEST 1 VIEW COMPARISON:  Chest radiograph dated 04/05/2022 FINDINGS: Normal lung volumes. Left basilar patchy and linear opacities. No pleural effusion or pneumothorax. Similar cardiomediastinal silhouette. The visualized skeletal structures are unremarkable. IMPRESSION: Left basilar patchy and linear opacities, likely atelectasis. Aspiration or pneumonia can be considered in the appropriate clinical setting. Electronically Signed   By: Darrin Nipper M.D.   On: 04/26/2022 14:30    Scheduled Meds:  atorvastatin  40 mg Oral Daily   Chlorhexidine Gluconate Cloth  6 each Topical QHS   diltiazem  120 mg Oral Q12H   escitalopram  10 mg Oral Daily   insulin aspart  0-24 Units Subcutaneous TID WC   insulin glargine-yfgn  12 Units Subcutaneous QHS   levothyroxine  150 mcg Oral QAC breakfast   metoprolol tartrate  25 mg Oral BID   pantoprazole  40 mg Oral BID   Rivaroxaban  15 mg Oral Q supper   tamsulosin  0.4 mg Oral Daily   Continuous  Infusions:  diltiazem (CARDIZEM) infusion Stopped (04/27/22 1551)   lactated ringers 50 mL/hr at 04/28/22 0343     LOS: 2 days   Time spent: 27min  Tamecka Milham C Kavari Parrillo, DO [facility]  If 7PM-7AM, please contact night-coverage [email or web address]  04/28/2022, 6:58 AM

## 2022-04-28 NOTE — TOC Progression Note (Signed)
Transition of Care [facility]) - Progression Note    Patient Details  Name: Patrick Rocha MRN: [id number] Date of Birth: 01-17-1946  Transition [facility] CM/SW [place], [place] Phone Number: 04/28/2022, 2:53 PM  Clinical Narrative:    Pt recently discharged from [facility] to [facility] for LTC placement. Prior to pt being fully assessed/admitted to ALF pt was brought to ED.  CSW spoke with Rowena [phone]) from [facility] who shares that she plans to visit pt in the hospital prior to him returning to their facility. She also shares that pt's daughter is interested in hospice services and would like to speak with hospital PMT prior to making decision. MD notified. Pt currently recommended for HHPT/OT at ALF. Rowena states that their facility has a Teaching laboratory technician for home health services. CSW will follow for hospice vs home health for pt at return.  [facility] unable to accept pt back to their facility on weekend due to pt being new admit. FL-2 will need to be faxed to [phone] prior to discharge.    Expected Discharge Plan: Assisted Living Barriers to Discharge: Continued Medical Work up  Expected Discharge Plan and Services In-house Referral: NA Discharge Planning Services: CM Consult Post Acute Care Choice: Resumption of Svcs/PTA Provider Living arrangements for the past 2 months: Assisted Living Facility                 DME Arranged: N/A DME Agency: NA       HH Arranged: NA HH Agency: NA         Social Determinants of Health (SDOH) Interventions SDOH Screenings   Food Insecurity: No Food Insecurity (04/27/2022)  Housing: High Risk (04/27/2022)  Transportation Needs: Unmet Transportation Needs (04/27/2022)  Utilities: Not At Risk (04/27/2022)  Tobacco Use: Medium Risk (04/26/2022)    Readmission Risk Interventions    04/28/2022    2:44 PM  Readmission Risk Prevention Plan  Transportation Screening Complete   PCP or Specialist Appt within 3-5 Days Complete  HRI or [place] Complete  Social Work Consult for [place] Planning/Counseling Complete  Palliative Care Screening Complete  Medication Review Press photographer) Complete

## 2022-04-28 NOTE — Consult Note (Addendum)
Cardiology Consultation   Patient ID: Patrick Rocha MRN: [id number]; DOB: [birth year]  Admit date: 04/26/2022 Date of Consult: 04/28/2022  PCP:  Elliot Dally, MD   [place] Providers Cardiologist:  None  -- New    Patient Profile:   Patrick Rocha is a 77 y.o. male with a hx of persistent atrial fibrillation s/p ablation, HTN, hypothyroidism, type 2 DM, depression, OSA on CPAP who is being seen 04/28/2022 for the evaluation of atrial fibrillation at the request of Dr. Avon Gully.  History of Present Illness:   Patrick Rocha is a 77 year old male with above medical history. Per chart review, patient was previously followed by [facility], [facility], and [facility] cardiology for treatment of atrial fibrillation. He had an afib ablation in 2004. More recently, he has been managed with carvedilol 12.5 mg bID and diltiazem 240 mg daily. On Xarelto 15 mg daily for stroke prevention.   Patient was recently admitted from 2/27-04/09/22 for treatment of hypotension. He had presented to the hospital after he had a syncopal episode after standing up from the toilet after a bowel movement. BP was as low as 60/40 with EMS, and patient was found to have AKI with creatinine 3. Patient was treated with IV fluid resuscitation with improvement in BP. He was noted to have been having diarrhea and was RSV positive. Echocardiogram on 04/06/22 showed EF 55-60%, no regional wall motion abnormalities, normal RV systolic function. He was instructed to stop taking diltiazem and lasix. Patient was ultimately discharged on carvedilol 1.5625 mg BID, benazepril 20 mg daily, xarelto 15 mg daily, and flomax 0.4 mg daily.   Patient presented to the ED on 3/19 complaining of lightheadedness. He had been discharged from a skilled nursing facility earlier in the day, and had just arrived at an assisted living facility when he started to feel lightheaded and collapsed to the floor. Upon  arrival to the ED, patient had hypotension with SBP in the 80s. He did complain of having poor oral intake and lack of appetite because the food did not taste good at his facility. Patient also reported taking lasix 40 mg daily for leg swelling. He had also continued to take carvedilol 12.5 mg BID and diltiazem 240 mg daily. He had not been given his synthroid in over 2 weeks. Labs in the ED showed K 2.7, creatinine 2.08, albumin 1.9. BNP 232.1. hsTn 38>40. WBC 7.8, hemoglobin 8.7, platelets 415. Fecal occult blood negative.  CXR showed left basilar patchy and linear opacities, likely atelectasis.   Patient was admitted to the internal medicine service for evaluation of syncope, afib with RVR. Given IV fluid resuscitation. He was started on IV diltiazem, later transitioned to PO diltiazem 120 mg BID, metoprolol tartrate 25 mg BID. Potassium and magnesium were supplemented. Lasix was held. Cardiology was consulted for atrial fibrillation.   Son is at bedside this morning.  Reports that patient had ongoing dizziness in 01/2022, was admitted to the hospital where he was found to have a brain mass.  He was started on steroids, and did have improvement in symptoms.  He had a biopsy of the brain mass at the end of February, and was started on a steroid taper.  About a week after his biopsy, son reports that he never really recovered.  He had issues with dizziness, syncope.  He came to the hospital at the end of February and his Lasix was completely discontinued.  Some of his medications were changed at  discharge, and he was sent to skilled nursing facility.  Son is concerned that at the skilled nursing facility, he was given some of the wrong medications.  He remained on benazepril even though this had been discontinued.  He also stayed on higher doses of carvedilol and diltiazem that was recommended at discharge.  He was not given his thyroid medication for the past 2 weeks.  After discontinuing Lasix, patient did  develop lower extremity edema.  He saw his primary care provider who instructed him to take Lasix 40 mg for 3 days followed by 20 mg for 3 additional days.  He was then supposed to stop taking Lasix.  However, he continued to receive Lasix at the nursing facility.  Additionally, patient did not like the food at the nursing facility and so he was not eating very much.  Today, patient is feeling well.  He denies dizziness, but he has not gotten out of bed yet.  He worked with physical therapy yesterday and did have some dizziness upon standing.  He does not have any lower extremity edema but does have some upper extremity edema.  Denies palpitations, fluttering feeling in his chest.  Per telemetry, his heart rate has been well-controlled in the 90s.  Past Medical History:  Diagnosis Date   Atrial fibrillation ([place]) 2004   pt had ablation around 2004 and says he has not went into atrial fibrillation since   Chronic kidney disease    stage 3   Depression    Diabetes mellitus without complication (HCC)    GERD (gastroesophageal reflux disease)    Hx of seasonal allergies    Hypertension 07/20/2010   Hypothyroidism    renal ca    cryoablation left kidney 02/2016   Sleep apnea    uses CPAP    Past Surgical History:  Procedure Laterality Date   IR GENERIC HISTORICAL  02/11/2016   IR RADIOLOGIST EVAL & MGMT 02/11/2016 Corrie Mckusick, DO [facility] INTERV RAD   IR RADIOLOGIST EVAL & MGMT  04/12/2016   IR RADIOLOGIST EVAL & MGMT  12/07/2016   IR RADIOLOGIST EVAL & MGMT  06/06/2017   IR RADIOLOGIST EVAL & MGMT  09/18/2018   IR RADIOLOGIST EVAL & MGMT  10/01/2019   IR RADIOLOGIST EVAL & MGMT  09/08/2020   PR DURAL GRAFT SPINAL Right 03/29/2022   Procedure: FRONTAL FRAMELESS STEREOTACTIC BIOPSY BRAIN WITH STEALTH;  Surgeon: Karsten Ro, DO;  Location: [facility];  Service: Neurosurgery;  Laterality: Right;   radioactive iodine thyroid     pt states this was many years ago     Home Medications:  Prior to  Admission medications   Medication Sig Start Date End Date Taking? Authorizing Provider  atorvastatin (LIPITOR) 40 MG tablet Take 40 mg by mouth every evening.   Yes [provider]  benazepril (LOTENSIN) 20 MG tablet Take 1 tablet (20 mg total) by mouth daily. 04/09/22 05/09/22 Yes Bonnell Public, MD  carvedilol (COREG) 3.125 MG tablet Take 0.5 tablets (1.5625 mg total) by mouth 2 (two) times daily with a meal. 04/09/22 05/09/22 Yes Ogbata, Babs Bertin, MD  escitalopram (LEXAPRO) 10 MG tablet Take 10 mg by mouth daily.   Yes [provider]  ferrous sulfate 325 (65 FE) MG tablet Take 325 mg by mouth 2 (two) times daily with a meal.   Yes [provider]  furosemide (LASIX) 20 MG tablet Take 20-40 mg by mouth See admin instructions. Take 40 mg by mouth in the morning  and 20 mg at 4 PM 04/15/22  Yes [provider]  glipiZIDE (GLUCOTROL) 10 MG tablet Take 10 mg by mouth daily.   Yes [provider]  insulin glargine (SEMGLEE, YFGN,) 100 UNIT/ML Solostar Pen Inject 20 Units into the skin every evening.   Yes [provider]  levothyroxine (SYNTHROID, LEVOTHROID) 150 MCG tablet Take 150 mcg by mouth daily before breakfast.   Yes [provider]  pantoprazole (PROTONIX) 40 MG tablet Take 40 mg by mouth 2 (two) times daily.   Yes [provider]  PRESCRIPTION MEDICATION CPAP- At bedtime   Yes [provider]  Rivaroxaban (XARELTO) 15 MG TABS tablet Take 1 tablet (15 mg total) by mouth daily with supper. 04/08/22  Yes Dawley, Troy C, DO  tamsulosin (FLOMAX) 0.4 MG CAPS capsule Take 0.4 mg by mouth daily.   Yes [provider]  LANTUS SOLOSTAR 100 UNIT/ML Solostar Pen Inject 5 Units into the skin daily. Patient not taking: Reported on 04/26/2022 04/09/22   Bonnell Public, MD    Inpatient Medications: Scheduled Meds:  atorvastatin  40 mg Oral Daily   Chlorhexidine Gluconate Cloth  6 each Topical QHS   diltiazem  120  mg Oral Q12H   escitalopram  10 mg Oral Daily   insulin aspart  0-24 Units Subcutaneous TID WC   insulin glargine-yfgn  12 Units Subcutaneous QHS   levothyroxine  150 mcg Oral QAC breakfast   metoprolol tartrate  25 mg Oral BID   pantoprazole  40 mg Oral BID   tamsulosin  0.4 mg Oral Daily   Continuous Infusions:  diltiazem (CARDIZEM) infusion Stopped (04/27/22 1551)   lactated ringers 50 mL/hr at 04/28/22 0343   PRN Meds: acetaminophen, ibuprofen, mouth rinse  Allergies:    Allergies  Allergen Reactions   Decadron [Dexamethasone] Swelling and Other (See Comments)    Stopped by Oncologist    Social History:   Social History   Socioeconomic History   Marital status: Widowed    Spouse name: Not on file   Number of children: 0   Years of education: Not on file   Highest education level: Not on file  Occupational History   Not on file  Tobacco Use   Smoking status: Former    Types: Cigarettes    Quit date: 67    Years since quitting: 30.2   Smokeless tobacco: Never  Vaping Use   Vaping Use: Never used  Substance and Sexual Activity   Alcohol use: Not Currently    Comment: 1-2 drinks once every few months   Drug use: Never   Sexual activity: Not Currently  Other Topics Concern   Not on file  Social History Narrative   Pt has 2 stepchildren   Social Determinants of Health   Financial Resource Strain: Not on file  Food Insecurity: No Food Insecurity (04/27/2022)   Hunger Vital Sign    Worried About Running Out of Food in the Last Year: Never true    Ran Out of Food in the Last Year: Never true  Transportation Needs: Unmet Transportation Needs (04/27/2022)   PRAPARE - Hydrologist (Medical): Yes    Lack of Transportation (Non-Medical): No  Physical Activity: Not on file  Stress: Not on file  Social Connections: Not on file  Intimate Partner Violence: Not At Risk (04/27/2022)   Humiliation, Afraid, Rape, and Kick questionnaire     Fear of Current or Ex-Partner: No    Emotionally  Abused: No    Physically Abused: No    Sexually Abused: No    Family History:   History reviewed. No pertinent family history.   ROS:  Please see the history of present illness.  All other ROS reviewed and negative.     Physical Exam/Data:   Vitals:   04/28/22 0500 04/28/22 0600 04/28/22 0700 04/28/22 0735  BP: (!) 101/58 (!) 110/59 110/63   Pulse: 70 78 75   Resp: 11 10 13    Temp:    97.7 F (36.5 C)  TempSrc:    Oral  SpO2: 95% 92% 94%   Weight:      Height:        Intake/Output Summary (Last 24 hours) at 04/28/2022 0926 Last data filed at 04/28/2022 0400 Gross per 24 hour  Intake 747.8 ml  Output 700 ml  Net 47.8 ml      04/27/2022    5:00 AM 04/26/2022    9:00 PM 04/26/2022    1:26 PM  Last 3 Weights  Weight (lbs) 252 lb 13.9 oz 259 lb 11.2 oz 264 lb 8.8 oz  Weight (kg) 114.7 kg 117.8 kg 120 kg     Body mass index is 37.34 kg/m.  General:  Elderly male, sitting upright in the bed in no acute distress.  HEENT: normal Neck: no JVD Vascular: Radial pulses 2+ bilaterally  Cardiac:  normal S1, S2; irregular rate and rhythm. No murmurs  Lungs: Fine crackles in the left lower lung base, otherwise clear to auscultation bilaterally.  Normal work of breathing on room air Abd: soft, nontender, no hepatomegaly  Ext: no edema in bilateral lower extremities.  Bilateral lower extremities are wrapped.  Does have 2+ pitting edema in bilateral upper extremities. Musculoskeletal:  No deformities, BUE and BLE strength normal and equal Skin: warm and dry  Neuro:  CNs 2-12 intact, no focal abnormalities noted Psych:  Normal affect   EKG:  The EKG was personally reviewed and demonstrates:  Atrial fibrillation, heart rate 135 bpm, right bundle branch block present Telemetry:  Telemetry was personally reviewed and demonstrates: Atrial fibrillation, heart rate in the 70s-90s  Relevant CV Studies:  Echocardiogram 04/06/2022 1. Left  ventricular ejection fraction, by estimation, is 55 to 60%. The  left ventricle has normal function. The left ventricle has no regional  wall motion abnormalities. There is mild concentric left ventricular  hypertrophy. Left ventricular diastolic  parameters are indeterminate.   2. Right ventricular systolic function is normal. The right ventricular  size is normal.   3. Left atrial size was mildly dilated.   4. Right atrial size was mildly dilated.   5. The mitral valve is normal in structure. Trivial mitral valve  regurgitation. No evidence of mitral stenosis.   6. The aortic valve is tricuspid. There is mild calcification of the  aortic valve. Aortic valve regurgitation is trivial. No aortic stenosis is  present.   7. The inferior vena cava is dilated in size with >50% respiratory  variability, suggesting right atrial pressure of 8 mmHg.    Laboratory Data:  High Sensitivity Troponin:   Recent Labs  Lab 04/26/22 1410 04/26/22 1623  TROPONINIHS 38* 40*     Chemistry Recent Labs  Lab 04/26/22 1410 04/26/22 1623 04/28/22 0252  NA 138  --  135  K 2.7*  --  3.2*  CL 102  --  101  CO2 23  --  27  GLUCOSE 151*  --  254*  BUN 21  --  24*  CREATININE 2.08* 2.02* 1.83*  CALCIUM 6.5*  --  6.9*  MG 1.1*  --  2.5*  GFRNONAA 33* 34* 38*  ANIONGAP 13  --  7    Recent Labs  Lab 04/26/22 1410  PROT 5.5*  ALBUMIN 1.9*  AST 35  ALT 25  ALKPHOS 57  BILITOT 1.3*   Lipids No results for input(s): "CHOL", "TRIG", "HDL", "LABVLDL", "LDLCALC", "CHOLHDL" in the last 168 hours.  Hematology Recent Labs  Lab 04/26/22 1410 04/26/22 1623 04/28/22 0252  WBC 7.8 8.1 7.3  RBC 2.78* 2.80* 2.36*  HGB 8.7* 8.8* 7.6*  HCT 27.7* 27.8* 23.4*  MCV 99.6 99.3 99.2  MCH 31.3 31.4 32.2  MCHC 31.4 31.7 32.5  RDW 15.6* 15.4 15.8*  PLT 415* 451* 405*   Thyroid No results for input(s): "TSH", "FREET4" in the last 168 hours.  BNP Recent Labs  Lab 04/26/22 1410  BNP 232.1*    DDimer No  results for input(s): "DDIMER" in the last 168 hours.   Radiology/Studies:  CT Head Wo Contrast  Result Date: 04/26/2022 CLINICAL DATA:  Head trauma, minor (Age >= 65y) EXAM: CT HEAD WITHOUT CONTRAST TECHNIQUE: Contiguous axial images were obtained from the base of the skull through the vertex without intravenous contrast. RADIATION DOSE REDUCTION: This exam was performed according to the departmental dose-optimization program which includes automated exposure control, adjustment of the mA and/or kV according to patient size and/or use of iterative reconstruction technique. COMPARISON:  CT head February 27, 24. FINDINGS: Brain: Similar hypoattenuation inferior right frontal lobe correlating with abnormal FLAIR signal seen on prior MRI. No evidence of acute large vascular territory infarct, acute hemorrhage, midline shift or hydrocephalus. Vascular: No hyperdense vessel identified. Skull: No acute fracture. Sinuses/Orbits: Paranasal sinus mucosal thickening and partial opacification. No acute orbital findings. Other: No mastoid effusions. IMPRESSION: 1. Similar hypoattenuation inferior right frontal lobe correlating with abnormal FLAIR signal seen on prior MRI. A repeat MRI could allow for more sensitive/direct comparison if clinically warranted. 2. No evidence of new/interval acute abnormality. Electronically Signed   By: Margaretha Sheffield M.D.   On: 04/26/2022 14:38   DG Knee 2 Views Left  Result Date: 04/26/2022 CLINICAL DATA:  Syncope. EXAM: LEFT KNEE - 1-2 VIEW COMPARISON:  None Available. FINDINGS: Minimal medial compartment joint space narrowing. Mild chronic enthesopathic change at the quadriceps insertion on patella. No joint effusion. No acute fracture is seen. No dislocation. Moderate vascular calcifications. Lateral thigh and knee subcutaneous fat edema appears similar to the contralateral side imaged the same day, likely systemic. IMPRESSION: Minimal medial compartment osteoarthritis.  No acute  fracture. Electronically Signed   By: Yvonne Kendall M.D.   On: 04/26/2022 14:34   DG Knee 2 Views Right  Result Date: 04/26/2022 CLINICAL DATA:  Syncope. EXAM: RIGHT KNEE - 1-2 VIEW COMPARISON:  None available FINDINGS: Minimal chronic enthesopathic change at the quadriceps insertion on the patella. There is a 3 mm separated ossicle at the distal tip of the lateral tibial spine, age indeterminate for the sequela of remote trauma versus recent injury. Minimal superior and inferior patellar degenerative osteophytes. Minimal medial compartment joint space narrowing. Minimal medial and lateral compartment chondrocalcinosis. Lateral thigh and knee subcutaneous fat edema appears similar to the contralateral side imaged the same day, likely systemic. IMPRESSION: 1. 3 mm separated ossicle at the distal tip of the lateral tibial spine, age indeterminate for the sequela of remote trauma versus recent injury. Otherwise, no acute fracture is seen. 2. Minimal medial  and lateral compartment osteoarthritis. Electronically Signed   By: Yvonne Kendall M.D.   On: 04/26/2022 14:32   DG Chest Port 1 View  Result Date: 04/26/2022 CLINICAL DATA:  Hypotension EXAM: PORTABLE CHEST 1 VIEW COMPARISON:  Chest radiograph dated 04/05/2022 FINDINGS: Normal lung volumes. Left basilar patchy and linear opacities. No pleural effusion or pneumothorax. Similar cardiomediastinal silhouette. The visualized skeletal structures are unremarkable. IMPRESSION: Left basilar patchy and linear opacities, likely atelectasis. Aspiration or pneumonia can be considered in the appropriate clinical setting. Electronically Signed   By: Darrin Nipper M.D.   On: 04/26/2022 14:30     Assessment and Plan:   Persistent Atrial Fibrillation  - Patient had an ablation in 2004. More recently, was treated with diltiazem 240 mg daily and carvedilol 12.5 mg BID  - In the ED, patient had episodes of afib with RVR with HR in the 130s. He was started on IV diltiazem drip  which was discontinued overnight - Now, patient is on diltiazem 120 mg BID and metoprolol tartrate 25 mg BID  - Per Telemetry-patient remains in atrial fibrillation but heart rate is better controlled.  In the 70s-90s -Continue current doses of metoprolol and diltiazem.  I will like to see patient get out of bed with physical therapy to see what his heart rate does on exertion.  If patient continues to have elevated heart rates with borderline low BP/orthostatic hypotension, could consider amiodarone - Hemoglobin down to 7.6 this morning.  Given anemia and recent issues with syncope/falls we will hold Xarelto for now  - K was 2.7 on arrival, now up to 3.2. Maintain K>4, mag>2 (K supplementation ordered per primary this AM)   Syncope  Orthostatic Hypotension  - Patient presented complaining of recurrent episodes of syncope. Found to be hypotensive with SBP in the 80s in the ED. He was treated with IV fluids and BP improved.  - Orthostatic Vital signs on 04/27/22 showed BP 152/69 when lying, 116/52 standing at 0 minutes, and BP 109/74 standing at 3 minutes  - Patient had been taking lasix 40 mg daily for lower extremity edema. No evidence of heart failure on echo from 03/2022. Of note, patient also mentioned having poor oral intake because he did not like the food at his facility  - Orthostatic hypotension is likely exacerbated by poor oral intake, continued use of diuretics, dehydration, anemia (hemoglobin 8.7 on arrival). Also was taking multiple medication that could lower BP, and had not been taking his synthroid for 2 weeks  - Instructed patient to wear compression stockings and to increase po intake. Can also add abdominal binder if necessary  - Question if brain mass is contributing to symptoms as well. Patient was suppose to complete steroid taper on 3/1, but his nursing facility has continued to give him steroids   Lower extremity edema - Patient reports that he has had bilateral lower  extremity edema intermittently.  His primary care provider instructed him to take 40 mg Lasix daily for 3 days followed by 20 mg daily for 3 days then stop taking Lasix.  However, patient continued to receive Lasix daily at his nursing facility - Echocardiogram from 03/2022 showed EF 55-60%, mild LVH.  Suspect that low albumin (albumin 1.9) and venous insufficiency are contributing further to lower extremity edema - Discussed increasing oral intake while limiting salt.  Also discussed elevating feet whenever possible and wearing compression stockings - Discussed that it is reasonable to continue low-dose Lasix as needed for lower extremity edema,  weight gain.  Instructed son/patient that he should not be taking Lasix every day  Otherwise per primary  - Polypharmacy  - Brain mass s/p biopsy  - Hypothyroidism  - Depression - GERD - BPH  - Anemia  Risk Assessment/Risk Scores:   CHA2DS2-VASc Score = 4   This indicates a 4.8% annual risk of stroke. The patient's score is based upon: CHF History: 0 HTN History: 1 Diabetes History: 1 Stroke History: 0 Vascular Disease History: 0 Age Score: 2 Gender Score: 0   For questions or updates, please contact [place] Please consult [email or web address] for contact info under    Signed, Margie Billet, PA-C  04/28/2022 9:26 AM   Patient seen and examined.  Agree with above documentation.  Patrick Rocha is a 77 year old male with a history of persistent atrial fibrillation status post ablation, recently diagnosed brain mass, T2DM, OSA, hypothyroidism, hypertension who we are consulted for evaluation of atrial fibrillation at the request of Dr. Avon Gully.  He has been seen by multiple cardiologist in the past, has followed at [facility], [facility], and [facility].  He underwent A-fib ablation in 2004.  He was admitted last month with hypotension, had syncopal episode after standing up from using the bathroom.  He was found to have AKI (creatinine 3) and  BP as low as 60/40.  He improved with IV fluids.  Also found to be RSV positive.  Echocardiogram 04/06/2022 showed normal biventricular function.  He was recommended to stop Lasix and diltiazem.  He presented back to ED on 3/19 with lightheadedness.  Found to be hypotensive with SBP in 80s.  Reported had not been eating much at his facility and was continuing to take Lasix (though this was prescribed as as needed).  He also continues to take Coreg and diltiazem.  In addition, he had not been getting his Synthroid.  Labs on admission notable for potassium 2.7, creatinine 2.08, BNP 232, troponin 38 > 40, lactate 1.9, WBC 7.8, hemoglobin 8.7, platelets 415.  EKG shows atrial fibrillation, rate 135, right bundle branch block. On exam, patient is alert and oriented, normal rate, irregular rhythm, no murmurs, lungs CTAB, Ace wrap around bilateral LE.  For his atrial fibrillation, rates appear controlled on diltiazem and metoprolol, would continue.  Hemoglobin is downtrending, down to 7.6 today.  Would hold Xarelto.  Discussed with Dr. Avon Gully, will check FOBT.  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  Suspect his orthostatic hypotension/syncope due to overdiuresis, was supposed to be on as needed Lasix but had been receiving daily Lasix.  Presented with AKI and profound hypokalemia, likely due to Lasix use.  Would hold diuretics.  He does not appear volume overloaded other than lower extremity edema, which suspect is due to venous insufficiency.  Recommend compression stockings.  Donato Heinz, MD

## 2022-04-28 NOTE — Evaluation (Signed)
Occupational Therapy Evaluation Patient Details Name: Patrick Rocha MRN: [id number] DOB: 1945/04/04 Today's Date: 04/28/2022   History of Present Illness Patrick Rocha is a 77 y.o. male with medical history significant of A-fib status post ablation, type 2 diabetes, hypothyroidism, hypertension brain mass status post biopsy who presents emergency department from his assisted living facility.  Patient was discharged from a skilled nursing facility the day of admission.  Upon presenting to assisted living facility patient had multiple episodes of syncope, lightheadedness, unsteadiness and collapsed to the floor. Recently in hospital for similar  episode.   Clinical Impression   Patient admitted for the diagnosis above.  Patient is scheduled to discharge to a local ALF, and may need to pay for additional ADL support initially.  In addition, he will need to be able to walk to the common dinning room for 3 meals each day.  Currently he would need wheelchair assist to complete this.  Primary deficits are ongoing dizziness, generalized weakness and poor activity tolerance.  Patient goals are to complete his own self care, and be able to walk to the common dinning area.  OT is indicated in the acute setting to address deficits, and assist with the transition to the next level of care.  If the ALF is able to provide the needed assist, home with [facility] OT is a possibility, if not, then a higher level of post acute rehab may be needed.        Recommendations for follow up therapy are one component of a multi-disciplinary discharge planning process, led by the attending physician.  Recommendations may be updated based on patient status, additional functional criteria and insurance authorization.   Follow Up Recommendations  Other (comment) (Home to ALF with [facility] OT if prgressing.  SNF may be needed depending on services ALF can provide.)     Assistance Recommended at Discharge Frequent or  constant Supervision/Assistance  Patient can return home with the following A little help with walking and/or transfers;A lot of help with bathing/dressing/bathroom;Assist for transportation;Assistance with cooking/housework    Functional Status Assessment  Patient has had a recent decline in their functional status and demonstrates the ability to make significant improvements in function in a reasonable and predictable amount of time.  Equipment Recommendations  None recommended by OT    Recommendations for Other Services       Precautions / Restrictions Precautions Precautions: Fall Precaution Comments: monitor BP, reports dizziness but this is usual Restrictions Weight Bearing Restrictions: No      Mobility Bed Mobility               General bed mobility comments: up in the recliner    Transfers Overall transfer level: Needs assistance Equipment used: Rolling walker (2 wheels) Transfers: Sit to/from Stand Sit to Stand: Min guard, Min assist                  Balance Overall balance assessment: Needs assistance Sitting-balance support: Feet supported Sitting balance-Leahy Scale: Good     Standing balance support: Reliant on assistive device for balance Standing balance-Leahy Scale: Poor                             ADL either performed or assessed with clinical judgement   ADL   Eating/Feeding: Independent   Grooming: Wash/dry hands;Set up;Sitting   Upper Body Bathing: Minimal assistance;Sitting   Lower Body Bathing: Moderate assistance;Sit to/from stand   Upper  Body Dressing : Minimal assistance;Sitting   Lower Body Dressing: Moderate assistance;Sit to/from stand   Toilet Transfer: Minimal assistance;Rolling walker (2 wheels);Regular Toilet                   Vision Patient Visual Report: Blurring of vision Additional Comments: patient reports increasing blurred vision to R eye post tumor biopsy     Perception      Praxis      Pertinent Vitals/Pain Pain Assessment Pain Assessment: Faces Faces Pain Scale: Hurts a little bit Pain Location: behind R eye Pain Descriptors / Indicators: Aching Pain Intervention(s): Monitored during session     Hand Dominance Right   Extremity/Trunk Assessment Upper Extremity Assessment Upper Extremity Assessment: Generalized weakness   Lower Extremity Assessment Lower Extremity Assessment: Defer to PT evaluation   Cervical / Trunk Assessment Cervical / Trunk Assessment: Other exceptions Cervical / Trunk Exceptions: body habitus   Communication Communication Communication: No difficulties   Cognition Arousal/Alertness: Awake/alert Behavior During Therapy: Flat affect Overall Cognitive Status: Within Functional Limits for tasks assessed                                       General Comments   VSS on RA with mild dizziness reported.      Exercises     Shoulder Instructions      Home Living Family/patient expects to be discharged to:: Assisted living   Available Help at Discharge: Available PRN/intermittently Type of Home: Assisted living Home Access: Level entry     Home Layout: One level     Bathroom Shower/Tub: Walk-in shower         Home Equipment: Conservation officer, nature (2 wheels);Shower seat;Grab bars - toilet;Grab bars - tub/shower;Hand held shower head   Additional Comments: patient will be able to pay for additional services for ADL initially      Prior Functioning/Environment Prior Level of Function : Needs assist       Physical Assist : ADLs (physical)   ADLs (physical): Bathing;Dressing Mobility Comments: pt reports using RW  and could walk the halls at the SNF. ADLs Comments: patient reports increased difficulty with LB ADL.  Was using hip kit at SNF, has a reacher at home        OT Problem List: Decreased strength;Decreased activity tolerance;Cardiopulmonary status limiting activity;Obesity;Increased  edema;Decreased knowledge of use of DME or AE      OT Treatment/Interventions: Self-care/ADL training;Therapeutic exercise;DME and/or AE instruction;Therapeutic activities;Balance training;Patient/family education    OT Goals(Current goals can be found in the care plan section) Acute Rehab OT Goals Patient Stated Goal: Be able to do my own self care and walk to the dinning room OT Goal Formulation: With patient Time For Goal Achievement: 05/12/22 Potential to Achieve Goals: Fair ADL Goals Pt Will Perform Grooming: sitting;standing;with set-up Pt Will Perform Upper Body Bathing: with set-up;sitting Pt Will Perform Lower Body Bathing: with min assist;sit to/from stand Pt Will Perform Upper Body Dressing: with supervision;sitting Pt Will Perform Lower Body Dressing: with min assist;sit to/from stand;with adaptive equipment Pt Will Transfer to Toilet: with supervision;ambulating;regular height toilet Pt/caregiver will Perform Home Exercise Program: Increased strength;Both right and left upper extremity;With theraband;With Supervision;With written HEP provided  OT Frequency: Min 2X/week    Co-evaluation              AM-PAC OT "6 Clicks" Daily Activity     Outcome Measure Help from  another person eating meals?: None Help from another person taking care of personal grooming?: A Little Help from another person toileting, which includes using toliet, bedpan, or urinal?: A Little Help from another person bathing (including washing, rinsing, drying)?: A Lot Help from another person to put on and taking off regular upper body clothing?: A Little Help from another person to put on and taking off regular lower body clothing?: A Lot 6 Click Score: 17   End of Session Equipment Utilized During Treatment: Rolling walker (2 wheels) Nurse Communication: Mobility status  Activity Tolerance: Patient limited by fatigue Patient left: in chair;with call bell/phone within reach  OT Visit Diagnosis:  Muscle weakness (generalized) (M62.81);Dizziness and giddiness (R42)                Time: 1339-1400 OT Time Calculation (min): 21 min Charges:  OT General Charges $OT Visit: 1 Visit OT Evaluation $OT Eval Moderate Complexity: 1 Mod  04/28/2022  RP, OTR/L  Acute Rehabilitation Services  Office:  [phone]   Metta Clines 04/28/2022, 2:07 PM

## 2022-04-28 NOTE — Progress Notes (Signed)
Initial Nutrition Assessment  DOCUMENTATION CODES:   Morbid obesity  INTERVENTION:  Ensure Max po daily, each supplement provides 150 kcal and 30 grams of protein.  Education on adequate nutrition/protein intake  Education on increased nutrition needs    NUTRITION DIAGNOSIS:   Inadequate oral intake related to decreased appetite as evidenced by per patient/family report, percent weight loss (9.6# (3.7%) wt loss x 1 week).   GOAL:   Weight gain   MONITOR:   PO intake, Supplement acceptance, Weight trends  REASON FOR ASSESSMENT:   Malnutrition Screening Tool    ASSESSMENT:   77 y.o. male with PMHx of A-fib s/p ablation, DM2 (last A1c 6.3), HLD, Depression, GERD, hypothyroidisim, HTN brain mass s/p biopsy who presents with hypotension and c/o lightheadeness and passing out Recently admitted for hypotension, volume resuscitated and discharged   Labs: K+ 3.2, Glu 254, BUN 24, Cr 1.83 Meds: novolog, semglee, protonix, lactated ringers Wt: 12.3 kg wt loss x 1 month (unsure if this is accurate or not)  PO: poor po intake due to dislike for food at SNF  I/O's: +918 ml   Visited patient at bedside who was eating cheese pizza for lunch. He reports improved appetite since he has been in the hospital because the food is palatable. Patient has been temporarily residing at an SNF where he dislikes the food and has had decreased po intake. Patient reports 15# wt loss and denies taking any form ONS. He reports a UBW of 260# which is actually a 7# difference.   Patient was inquisitive of ONS and agreed that they would be good to have on hand at SNF when he is not eating well. He reports weakness while ambulating.   Patient denies chewing/swallowing issues.   NUTRITION - FOCUSED PHYSICAL EXAM:  Patient was in the middle of eating his lunch meal- RD to return for evaluation   Diet Order:   Diet Order             Diet Heart Room service appropriate? Yes; Fluid consistency: Thin;  Fluid restriction: 2000 mL Fluid  Diet effective now                   EDUCATION NEEDS:   Education needs have been addressed  Skin:  Skin Assessment: Reviewed RN Assessment  Last BM:  3/20  Height:   Ht Readings from Last 1 Encounters:  04/26/22 5\' 9"  (1.753 m)    Weight:   Wt Readings from Last 1 Encounters:  04/27/22 114.7 kg    BMI:  Body mass index is 37.34 kg/m.  Estimated Nutritional Needs:   Kcal:  1750-2100  Protein:  85-106  Fluid:  >/= 2L    Patrick Rocha, Patrick Rocha, Patrick Rocha  Clinical Nutrition

## 2022-04-29 ENCOUNTER — Telehealth: Payer: Self-pay

## 2022-04-29 DIAGNOSIS — Z515 Encounter for palliative care: Secondary | ICD-10-CM

## 2022-04-29 DIAGNOSIS — D496 Neoplasm of unspecified behavior of brain: Secondary | ICD-10-CM | POA: Diagnosis not present

## 2022-04-29 DIAGNOSIS — R55 Syncope and collapse: Secondary | ICD-10-CM | POA: Diagnosis not present

## 2022-04-29 DIAGNOSIS — R627 Adult failure to thrive: Secondary | ICD-10-CM | POA: Diagnosis not present

## 2022-04-29 DIAGNOSIS — I4891 Unspecified atrial fibrillation: Secondary | ICD-10-CM

## 2022-04-29 DIAGNOSIS — Z7189 Other specified counseling: Secondary | ICD-10-CM

## 2022-04-29 LAB — BASIC METABOLIC PANEL
Anion gap: 11 (ref 5–15)
BUN: 20 mg/dL (ref 8–23)
CO2: 22 mmol/L (ref 22–32)
Calcium: 7.2 mg/dL — ABNORMAL LOW (ref 8.9–10.3)
Chloride: 103 mmol/L (ref 98–111)
Creatinine, Ser: 1.75 mg/dL — ABNORMAL HIGH (ref 0.61–1.24)
GFR, Estimated: 40 mL/min — ABNORMAL LOW (ref >60)
Glucose, Bld: 150 mg/dL — ABNORMAL HIGH (ref 70–99)
Potassium: 3.4 mmol/L — ABNORMAL LOW (ref 3.5–5.1)
Sodium: 136 mmol/L (ref 135–145)

## 2022-04-29 LAB — URINALYSIS, ROUTINE W REFLEX MICROSCOPIC
Bacteria, UA: NONE SEEN
Bilirubin Urine: NEGATIVE
Glucose, UA: 50 mg/dL — AB
Ketones, ur: NEGATIVE mg/dL
Leukocytes,Ua: NEGATIVE
Nitrite: NEGATIVE
Protein, ur: 100 mg/dL — AB
RBC / HPF: >50 RBC/hpf (ref 0–5)
Specific Gravity, Urine: 1.026 (ref 1.005–1.030)
pH: 5 (ref 5.0–8.0)

## 2022-04-29 LAB — GLUCOSE, CAPILLARY
Glucose-Capillary: 140 mg/dL — ABNORMAL HIGH (ref 70–99)
Glucose-Capillary: 251 mg/dL — ABNORMAL HIGH (ref 70–99)
Glucose-Capillary: 169 mg/dL — ABNORMAL HIGH (ref 70–99)
Glucose-Capillary: 215 mg/dL — ABNORMAL HIGH (ref 70–99)

## 2022-04-29 LAB — CBC
HCT: 26.9 % — ABNORMAL LOW (ref 39.0–52.0)
Hemoglobin: 8.8 g/dL — ABNORMAL LOW (ref 13.0–17.0)
MCH: 31.9 pg (ref 26.0–34.0)
MCHC: 32.7 g/dL (ref 30.0–36.0)
MCV: 97.5 fL (ref 80.0–100.0)
Platelets: 473 10*3/uL — ABNORMAL HIGH (ref 150–400)
RBC: 2.76 MIL/uL — ABNORMAL LOW (ref 4.22–5.81)
RDW: 15.7 % — ABNORMAL HIGH (ref 11.5–15.5)
WBC: 7.9 10*3/uL (ref 4.0–10.5)
nRBC: 0.6 % — ABNORMAL HIGH (ref 0.0–0.2)

## 2022-04-29 MED ORDER — POTASSIUM CHLORIDE CRYS ER 20 MEQ PO TBCR
40.0000 meq | EXTENDED_RELEASE_TABLET | Freq: Once | ORAL | Status: AC
  Administered 2022-04-29: 40 meq via ORAL
  Filled 2022-04-29: qty 2

## 2022-04-29 MED ORDER — HEPARIN SODIUM (PORCINE) 5000 UNIT/ML IJ SOLN
5000.0000 [IU] | Freq: Three times a day (TID) | INTRAMUSCULAR | Status: DC
  Administered 2022-04-29 – 2022-05-04 (×15): 5000 [IU] via SUBCUTANEOUS
  Filled 2022-04-29 (×15): qty 1

## 2022-04-29 NOTE — Consult Note (Signed)
Palliative Care Consult Note                                  Date: 04/29/2022   Patient Name: Patrick Rocha  DOB: [birth year]  MRN: [id number]  Age / Sex: 77 y.o., male  PCP: Elliot Dally, MD Referring Physician: Little Ishikawa, MD  Reason for Consultation: Establishing goals of care  HPI/Patient Profile: "Patrick Rocha is a 77 y.o. male  with past medical history of A-fib status post ablation, type 2 diabetes, hypothyroidism, hypertension, and recently discovered brain mass who presented to [facility] ED on 04/26/2022 with syncope. He was recently hospitalized on 04/05/22 with a similar episode thought to be secondary to hypotension and RSV infection.  He was discharged to SNF for rehab on 04/09/22. After completing rehab, he transferred to ALF on 04/26/22 and on arrival began having multiple syncopal episodes and had collapsed on the floor.  He was again found to be hypotensive on presentation.  Family indicates poor p.o. intake over the past few days.  Patient was admitted for further evaluation of syncope and failure to thrive.  Palliative Medicine was consulted for goals of care.  Subjective:   I have reviewed medical records including progress notes, labs and imaging, discussed with Dr. Avon Gully, and met with patient at bedside to discuss diagnosis, prognosis, and goals of care.   I introduced Palliative Medicine as specialized medical care for people living with serious illness. It focuses on providing relief from the symptoms and stress of a serious illness.   A brief life review was discussed. 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  His wife passed away 8 years ago.  He has 2 sons - Mortimer Fries lives in [place] and Hayti lives in [place], [place].   Created space and opportunity for patient to express thoughts and feelings regarding his current medical situation. Values and goals of care were attempted to be elicited.  Patrick Rocha  tells me that the past few months have been "stressful and bad".  He tells me that he has a "brain tumor" but has not yet been well enough to receive treatment (radiation).  He states his goal is to be able to walk and "do some things for himself".  I attempted to discuss advanced directives - concepts specific to code status, artificial feeding/hydration, and rehospitalization.  Patrick Rocha is quick to state that his daughter-in-law Herschel Senegal is his healthcare agent.  He defers further GOC discussion to [facility]. _____________________________________________________________________________________  Per patient request, I spoke with his daughter-in-law Herschel Senegal by phone.  I introduced myself and the role of Palliative Medicine.  Herschel Senegal confirms that she is Mudlogger.  We discussed patient's current medical situation and what it means in the larger context of his ongoing co-morbidities. Current clinical status was reviewed. Natural disease trajectory of chronic illness was discussed.  Herschel Senegal reports that Patrick Rocha has significantly and rapidly declined in the last few months.  She verbalizes understanding of the seriousness of his current medical condition.  Discussed different paths of care - full scope versus limited interventions versus comfort care.  Explained and offered outpatient palliative and hospice services.  Plan for follow-up [place] discussion tomorrow with patient, son, and daughter-in-law.  I have requested that she bring a copy of patient's advance directive documents for review.   Review of Systems  Neurological:  Positive for syncope, weakness, light-headedness and headaches.    Objective:  Primary Diagnoses: Present on Admission:  Failure to thrive in adult  Atrial fibrillation with RVR [facility])   Physical Exam Vitals reviewed.  Constitutional:      General: He is not in acute distress.    Appearance: He is ill-appearing.  Pulmonary:     Effort: Pulmonary  effort is normal.  Neurological:     Mental Status: He is alert and oriented to person, place, and time.     Motor: Weakness present.  Psychiatric:        Mood and Affect: Affect is flat.     Vital Signs:  BP 131/72 (BP Location: Right Arm)   Pulse 97   Temp 98.4 F (36.9 C) (Oral)   Resp 16   Ht 5\' 9"  (1.753 m)   Wt 114.7 kg   SpO2 94%   BMI 37.34 kg/m   Palliative Assessment/Data: PPS 40%     Assessment & Plan:   SUMMARY OF RECOMMENDATIONS   Full code full scope for now Meeting with patient, son, and daughter-in-law tomorrow at 93 AM Family is asked to bring a copy of patient's advanced directives for review  Primary Decision Maker: PATIENT Health care agent is his daughter-in-law Herschel Senegal  Prognosis:  Unable to determine  Discharge Planning:  To Be Determined    Thank you for allowing [place] to participate in the care of [place]   Signed by: Elie Confer, NP Palliative Medicine Team  Team Phone # [phone]  For individual providers, please see AMION

## 2022-04-29 NOTE — Progress Notes (Addendum)
Rounding Note    Patient Name: Patrick Rocha Date of Encounter: 04/29/2022  [facility] Cardiologist: New  Subjective   Patient remains in rate controlled Afib. He is feeling about the same. He has been up and about and has some lightheadedness with this. Hgb 7.6, AM labs pending. Bps today overall improved.   Inpatient Medications    Scheduled Meds:  atorvastatin  40 mg Oral Daily   Chlorhexidine Gluconate Cloth  6 each Topical QHS   diltiazem  120 mg Oral Q12H   escitalopram  10 mg Oral Daily   insulin aspart  0-24 Units Subcutaneous TID WC   insulin glargine-yfgn  12 Units Subcutaneous QHS   levothyroxine  150 mcg Oral QAC breakfast   metoprolol tartrate  25 mg Oral BID   pantoprazole  40 mg Oral BID   potassium chloride  40 mEq Oral Once   tamsulosin  0.4 mg Oral Daily   Continuous Infusions:  diltiazem (CARDIZEM) infusion Stopped (04/27/22 1551)   PRN Meds: acetaminophen, ibuprofen, mouth rinse   Vital Signs    Vitals:   04/28/22 1200 04/28/22 1259 04/28/22 2137 04/29/22 0539  BP: 114/79 (!) 90/58 122/66 124/71  Pulse: 100 79 89 95  Resp: 19 17 19 17   Temp:  97.6 F (36.4 C)  97.8 F (36.6 C)  TempSrc:  Oral  Oral  SpO2: 92% 93% 92% 93%  Weight:      Height:        Intake/Output Summary (Last 24 hours) at 04/29/2022 0821 Last data filed at 04/29/2022 0600 Gross per 24 hour  Intake 430 ml  Output 200 ml  Net 230 ml      04/27/2022    5:00 AM 04/26/2022    9:00 PM 04/26/2022    1:26 PM  Last 3 Weights  Weight (lbs) 252 lb 13.9 oz 259 lb 11.2 oz 264 lb 8.8 oz  Weight (kg) 114.7 kg 117.8 kg 120 kg      Telemetry    Afib HR 80-90s, PVCs - Personally Reviewed  ECG    No new - Personally Reviewed  Physical Exam   GEN: No acute distress.   Neck: No JVD Cardiac: Irreg Irreg, no murmurs, rubs, or gallops.  Respiratory: Clear to auscultation bilaterally. GI: Soft, nontender, non-distended  MS: minimal lower leg edema; No  deformity. Neuro:  Nonfocal  Psych: Normal affect   Labs    High Sensitivity Troponin:   Recent Labs  Lab 04/26/22 1410 04/26/22 1623  TROPONINIHS 38* 40*     Chemistry Recent Labs  Lab 04/26/22 1410 04/26/22 1623 04/28/22 0252 04/29/22 0557  NA 138  --  135 136  K 2.7*  --  3.2* 3.4*  CL 102  --  101 103  CO2 23  --  27 22  GLUCOSE 151*  --  254* 150*  BUN 21  --  24* 20  CREATININE 2.08* 2.02* 1.83* 1.75*  CALCIUM 6.5*  --  6.9* 7.2*  MG 1.1*  --  2.5*  --   PROT 5.5*  --   --   --   ALBUMIN 1.9*  --   --   --   AST 35  --   --   --   ALT 25  --   --   --   ALKPHOS 57  --   --   --   BILITOT 1.3*  --   --   --   GFRNONAA 33* 34*  38* 40*  ANIONGAP 13  --  7 11    Lipids No results for input(s): "CHOL", "TRIG", "HDL", "LABVLDL", "LDLCALC", "CHOLHDL" in the last 168 hours.  Hematology Recent Labs  Lab 04/26/22 1410 04/26/22 1623 04/28/22 0252  WBC 7.8 8.1 7.3  RBC 2.78* 2.80* 2.36*  HGB 8.7* 8.8* 7.6*  HCT 27.7* 27.8* 23.4*  MCV 99.6 99.3 99.2  MCH 31.3 31.4 32.2  MCHC 31.4 31.7 32.5  RDW 15.6* 15.4 15.8*  PLT 415* 451* 405*   Thyroid No results for input(s): "TSH", "FREET4" in the last 168 hours.  BNP Recent Labs  Lab 04/26/22 1410  BNP 232.1*    DDimer No results for input(s): "DDIMER" in the last 168 hours.   Radiology    No results found.  Cardiac Studies   Echocardiogram 04/06/2022 1. Left ventricular ejection fraction, by estimation, is 55 to 60%. The  left ventricle has normal function. The left ventricle has no regional  wall motion abnormalities. There is mild concentric left ventricular  hypertrophy. Left ventricular diastolic  parameters are indeterminate.   2. Right ventricular systolic function is normal. The right ventricular  size is normal.   3. Left atrial size was mildly dilated.   4. Right atrial size was mildly dilated.   5. The mitral valve is normal in structure. Trivial mitral valve  regurgitation. No evidence of  mitral stenosis.   6. The aortic valve is tricuspid. There is mild calcification of the  aortic valve. Aortic valve regurgitation is trivial. No aortic stenosis is  present.   7. The inferior vena cava is dilated in size with >50% respiratory  variability, suggesting right atrial pressure of 8 mmHg.   Patient Profile     77 y.o. male with a h/o persistent Afib s/p ablation, HTN, hypothyroidism, DM2, depression, OSA on CPAP who is being seen for Afib.   Assessment & Plan    Persistent Afib - h/o afib with ablation in 2004 on diltiazem and Coreg - presented with recurrent syncope found to be hypotensive with paroxysmal Afib with RVR started on IV dilt which was subsequently discontinued.  - he remains in afib with rates 80-90s - he is taking diltiazem 120mg  BID and metoprolol tartrate 25mg  BID - Bps soft overnight, but overall stable - Hgb down to 7.6 and Xarelto was held. Hgb improved today to 8.8.  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, recommend having neurosurgery/oncology weigh in about safety of anticoagulation moving forward   Syncope Orthostatic Hypotension - presented with syncope found to be hypotensive with SBP into the 80s and rapid afib, given IVF - orthostatics were positive - Bps overall better - Hgb low at 7.6 on admission, improved to 8.8 today - suspect syncope/orthostasis from diuresis exacerbated by anemia - lasix held and kidney function is improving - re-check orthostatics  Lower leg edema - PTA lasix 40mg  daily>held as above - echo 03/2022 showed LVEF 55-60%, mild LVH - albumin 1.9, third spacing contributing.  Likely also venous insufficiency - recommend compression stockings  For questions or updates, please contact [place] Please consult [email or web address] for contact info under      Signed, Cadence Ninfa Meeker, PA-C  04/29/2022, 8:21 AM     Patient seen and examined.  Agree with above documentation.  On exam, patient is  alert and oriented, tachycardic, irregular rhythm, no murmurs, lungs CTAB,trace LE edema.  Afib rates reasonably controlled, continue diltiazem and metoprolol.  Xarelto was held given hemoglobin down to  7.6 yesterday.  Improved to 8.8 today.  Would hold off on restarting until cleared by neurosurgery/oncology given brain mass.  BP appears improved, will recheck orthostatics.  Donato Heinz, MD

## 2022-04-29 NOTE — Care Management Important Message (Signed)
Important Message  Patient Details IM Letter given. Name: Patrick Rocha MRN: [id number] Date of Birth: 1945/06/08   Medicare Important Message Given:  Yes     Kerin Salen 04/29/2022, 11:51 AM

## 2022-04-29 NOTE — Progress Notes (Signed)
Physical Therapy Treatment Patient Details Name: Patrick Rocha MRN: [id number] DOB: 12-09-1945 Today's Date: 04/29/2022   History of Present Illness Patrick Rocha is a 77 y.o. male with medical history significant of A-fib status post ablation, type 2 diabetes, hypothyroidism, hypertension brain mass status post biopsy who presents emergency department from his assisted living facility.  Patient was discharged from a skilled nursing facility the day of admission.  Upon presenting to assisted living facility patient had multiple episodes of syncope, lightheadedness, unsteadiness and collapsed to the floor. Recently in hospital for similar  episode.    PT Comments    General Comments: AxO x 3 very pleasant but feeling VERY "tired" and "dizzy" sitting upright in recliner having just used BSC with NT.  "I need to rest a few minutes" before attempting anything else.BP in recliner sitting upright with B LE on floor was 93/55, HR range 71 - 92 (A Fib) and RA 98%.  Assisted back to bed.  EOB BP was 87/60, HR range 84 - 96 (A Fib) and RA 95%.  Was unable to amb.  General bed mobility comments: Min Assist to support B LE up onto bed due to MAX c/o fatigue.  Positioned to comfort. Palliative Provider entered room.   Recommendations for follow up therapy are one component of a multi-disciplinary discharge planning process, led by the attending physician.  Recommendations may be updated based on patient status, additional functional criteria and insurance authorization.  Follow Up Recommendations  Home health PT Can patient physically be transported by private vehicle: No   Assistance Recommended at Discharge Frequent or constant Supervision/Assistance  Patient can return home with the following A lot of help with walking and/or transfers;A lot of help with bathing/dressing/bathroom;Assistance with cooking/housework;Assist for transportation;Help with stairs or ramp for entrance    Equipment Recommendations  None recommended by PT    Recommendations for Other Services       Precautions / Restrictions Precautions Precautions: Fall;Other (comment) (orthostatic) Precaution Comments: monitor BP, reports dizziness, TEDS Restrictions Weight Bearing Restrictions: No     Mobility  Bed Mobility Overal bed mobility: Needs Assistance Bed Mobility: Sit to Supine     Supine to sit: Min assist     General bed mobility comments: Min Assist to support B LE up onto bed due to MAX c/o fatigue.    Transfers Overall transfer level: Needs assistance Equipment used: Rolling walker (2 wheels) Transfers: Sit to/from Stand Sit to Stand: Min guard, Min assist           General transfer comment: pt able to self stand and pivot from recliner to EOB without any AD rather quickly due to MAX c/o fatigue.    Ambulation/Gait               General Gait Details: unable to attempt due to MAX c/o dizziness   Stairs             Wheelchair Mobility    Modified Rankin (Stroke Patients Only)       Balance                                            Cognition  General Comments: AxO x 3 very pleasant but feeling VERY "tired" and "dizzy" sitting upright in recliner having just used BSC with NT.  "I need to rest a few minutes" before attempting anything else.        Exercises      General Comments        Pertinent Vitals/Pain Pain Assessment Pain Assessment: No/denies pain    Home Living                          Prior Function            PT Goals (current goals can now be found in the care plan section) Progress towards PT goals: Progressing toward goals    Frequency    Min 3X/week      PT Plan Current plan remains appropriate    Co-evaluation              AM-PAC PT "6 Clicks" Mobility   Outcome Measure  Help needed turning from your back  to your side while in a flat bed without using bedrails?: A Little Help needed moving from lying on your back to sitting on the side of a flat bed without using bedrails?: A Little Help needed moving to and from a bed to a chair (including a wheelchair)?: A Little Help needed standing up from a chair using your arms (e.g., wheelchair or bedside chair)?: A Little Help needed to walk in hospital room?: A Little Help needed climbing 3-5 steps with a railing? : A Little 6 Click Score: 18    End of Session Equipment Utilized During Treatment: Gait belt Activity Tolerance: Patient limited by fatigue;Other (comment) (MAX c/o dizziness) Patient left: in bed;with call bell/phone within reach;with bed alarm set Nurse Communication: Mobility status PT Visit Diagnosis: Difficulty in walking, not elsewhere classified (R26.2)     Time: [id number] PT Time Calculation (min) (ACUTE ONLY): 15 min  Charges:  $Therapeutic Activity: 8-22 mins                     Rica Koyanagi  PTA [place] [facility] M-F          [phone] Weekend pager [phone]

## 2022-04-29 NOTE — Progress Notes (Addendum)
PROGRESS NOTE    Patrick Rocha  [id number] DOB: 08/18/1945 DOA: 04/26/2022  PCP: Elliot Dally, MD  Brief Narrative:  Patrick Rocha is a 77 y.o. male with medical history significant of A-fib status post ablation, type 2 diabetes, hypothyroidism, hypertension brain mass status post biopsy who presents emergency department from his assisted living facility.  Patient was discharged from a skilled nursing facility the day of admission.  Upon presenting to assisted living facility patient had multiple episodes of syncope, lightheadedness, unsteadiness and collapsed to the floor.  Patient had similar episode 04/05/2022 secondary to hypotension and RSV infection.  Patient again was found to be hypotensive at intake, patient and family indicate poor p.o. intake over the past few days to week.  Patient admitted for further evaluation and workup.  Assessment & Plan:   Principal Problem:   Failure to thrive in adult Active Problems:   Atrial fibrillation with RVR (HCC)   Syncope and collapse   Leg edema  Acute on recurrent episodes of syncope, likely orthostatic  -Patient continues to have episodes of orthostatic hypotension which are moderately symptomatic with dizziness and loss of balance.  He was not syncopal or presyncopal today however. -Resume compression stockings- Unable to tolerate previously due to edema -Echo previously performed was without indication of heart failure -PT OT ongoing current recommendations for home health PT -Appreciate cardiology's help managing BP, Afib, and orthostatic hypotension. Patient has had multiple medication changes over the past month between different providers/facilities  A-fib with RVR, asymptomatic, ongoing, rate improved -Known history of A-fib on carvedilol 12.5 and diltiazem 240 daily -Diltiazem drip discontinued - titrating diltiazem as appropriate -Consider amiodarone per cardiology if rate control continues to be an  issue -Appreciate cardiology's intervention given his multiple providers/admissions/medication changes for definitive management of volume status/BP/afibRVR. -Xarelto currently on hold, discussed with cardiology, I do feel that patient's risk of bleeding in the setting of brain mass and fall risk do outweigh his risk of acute stroke in the setting of A-fib.  Certainly this conversation can be held with patient's primary care team outpatient however at this time especially during his critical illness we will discontinue the Xarelto.   Anemia, somewhat acute, unclear etiology  -Baseline hemoglobin 4 months ago was normal at 15 -Hemoglobin has down trended over the past 3 to 4 months currently 7-8.  No signs or symptoms of bleeding at this time, will order Hemoccult testing for stool given patient notes he "sometimes has dark stool" but denies any black tarry stool and certainly no bright red blood per rectum. -Concern this may be advancing chronic anemia of chronic disease given patient's multiple comorbidities including CKD 3B. -Will follow iron panel with morning labs -Hold off transfusion at this time unless hemoglobin less than 7, he becomes symptomatic, or overt bleeding noted.  Polypharmacy, high risk -Patient has had multiple hospitalizations transition to SNF and back to assisted living with apparently multiple episodes of medication changes so much so now that the patient and family are worried patient is receiving the wrong medications at facility given his medication list presented here is different than his prior. Patient is no longer taking benazepril, he has not been given his Synthroid in 2 weeks despite it being active on his med list. - There is concern multiple of patient's medications are being inappropriately held or administered.  Will go over med list with patient and family very closely at discharge.  Recommend no other medication changes after discharge until he  follows up with  his PCP who is well aware of the situation.  Brain mass, recently discovered  -Noted during December 2023 hospitalization.  Patient on steroids at the time, no indication for procedure at that time. -Unclear if patient's mass with 3 mm midline shift is associated with episodes of syncope as above although given orthostatics/hypotension this is unlikely.  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    Hypomagnesemia- -replaced, follow repeat labs   Hyperlipidemia-continue Lipitor   Depression-continue escitalopram   GERD-continue Protonix   BPH-continue Flomax   Type 2 diabetes, uncontrolled with hyperglycemia -A1c 6.3, continue sliding scale insulin/hypoglycemic protocol  Goals of care -Palliative care consulted - appreciate insight and recommendations -Lengthy discussion today with family over the phone about patient's prognosis.  They understand that given patient's age and multiple comorbidities his risk of morbidity mortality are elevated however previous discussion over hospice is likely somewhat aggressive, would recommend palliative follow-up and further discussion on goals of care as patient remains full code but has discussed with family previously that there were some heroic measures he would not want to undergo, unclear what these were. -Will attempt to have family follow-up with either Living Will or MOST form to further delineate future care.  DVT prophylaxis: Xarelto discontinued, transition to heparin subQ Code Status: Full Family Communication: Son at bedside  Status is: inpt  Dispo: The patient is from: ALF              Anticipated d/c is to: TBD current recommendations: HHPT              Anticipated d/c date is: TBD              Patient currently NOT medically stable for  discharge  Consultants:  Cardiology  Procedures:  None  Antimicrobials:  None   Subjective: No acute issues or events overnight, patient became markedly orthostatic again today while attempting to ambulate to the bedside chair.  He is clearly frustrated with the backtrack as yesterday he was apparently doing quite well into the afternoon with decreased symptoms.  Otherwise denies nausea vomiting diarrhea constipation headache fevers chills or chest pain  Objective: Vitals:   04/28/22 1200 04/28/22 1259 04/28/22 2137 04/29/22 0539  BP: 114/79 (!) 90/58 122/66 124/71  Pulse: 100 79 89 95  Resp: 19 17 19 17   Temp:  97.6 F (36.4 C)  97.8 F (36.6 C)  TempSrc:  Oral  Oral  SpO2: 92% 93% 92% 93%  Weight:      Height:        Intake/Output Summary (Last 24 hours) at 04/29/2022 0820 Last data filed at 04/29/2022 0600 Gross per 24 hour  Intake 430 ml  Output 200 ml  Net 230 ml    Filed Weights   04/26/22 1326 04/26/22 2100 04/27/22 0500  Weight: 120 kg 117.8 kg 114.7 kg    Examination:  General:  Pleasantly resting in bed, No acute distress. HEENT:  Normocephalic atraumatic.  Sclerae nonicteric, noninjected.  Extraocular movements intact bilaterally. Neck:  Without mass or deformity.  Trachea is midline. Lungs:  Clear to auscultate bilaterally without rhonchi, wheeze, or rales. Heart: Irregularly irregular without overt murmurs rubs Abdomen:  Soft, nontender, nondistended.  Without guarding or rebound. Extremities: Without cyanosis, clubbing, scant to 1+ pitting edema bilateral lower extremities  Data Reviewed:  I have personally reviewed following labs and imaging studies  CBC: Recent Labs  Lab 04/26/22 1410 04/26/22 1623 04/28/22 0252  WBC 7.8 8.1 7.3  NEUTROABS 6.0  --   --   HGB 8.7* 8.8* 7.6*  HCT 27.7* 27.8* 23.4*  MCV 99.6 99.3 99.2  PLT 415* 451* 405*    Basic Metabolic Panel: Recent Labs  Lab 04/26/22 1410 04/26/22 1623 04/28/22 0252  04/29/22 0557  NA 138  --  135 136  K 2.7*  --  3.2* 3.4*  CL 102  --  101 103  CO2 23  --  27 22  GLUCOSE 151*  --  254* 150*  BUN 21  --  24* 20  CREATININE 2.08* 2.02* 1.83* 1.75*  CALCIUM 6.5*  --  6.9* 7.2*  MG 1.1*  --  2.5*  --   PHOS 2.9  --   --   --     GFR: Estimated Creatinine Clearance: 45.6 mL/min (A) (by C-G formula based on SCr of 1.75 mg/dL (H)). Liver Function Tests: Recent Labs  Lab 04/26/22 1410  AST 35  ALT 25  ALKPHOS 57  BILITOT 1.3*  PROT 5.5*  ALBUMIN 1.9*    Coagulation Profile: Recent Labs  Lab 04/26/22 1410  INR 1.2    CBG: Recent Labs  Lab 04/28/22 0738 04/28/22 1131 04/28/22 1647 04/28/22 2116 04/29/22 0722  GLUCAP 181* 244* 175* 231* 140*    Sepsis Labs: Recent Labs  Lab 04/26/22 1410 04/26/22 1623  LATICACIDVEN 1.9 1.6     Recent Results (from the past 240 hour(s))  MRSA Next Gen by PCR, Nasal     Status: None   Collection Time: 04/26/22  6:20 PM   Specimen: Nasal Mucosa; Nasal Swab  Result Value Ref Range Status   MRSA by PCR Next Gen NOT DETECTED NOT DETECTED Final    Comment: (NOTE) The GeneXpert MRSA Assay (FDA approved for NASAL specimens only), is one component of a comprehensive MRSA colonization surveillance program. It is not intended to diagnose MRSA infection nor to guide or monitor treatment for MRSA infections. Test performance is not FDA approved in patients less than 64 years old. Performed at [facility], [place] [address] [place], [place] [address]          Radiology Studies: No results found.  Scheduled Meds:  atorvastatin  40 mg Oral Daily   Chlorhexidine Gluconate Cloth  6 each Topical QHS   diltiazem  120 mg Oral Q12H   escitalopram  10 mg Oral Daily   insulin aspart  0-24 Units Subcutaneous TID WC   insulin glargine-yfgn  12 Units Subcutaneous QHS   levothyroxine  150 mcg Oral QAC breakfast   metoprolol tartrate  25 mg Oral BID   pantoprazole  40 mg Oral BID    tamsulosin  0.4 mg Oral Daily   Continuous Infusions:  diltiazem (CARDIZEM) infusion Stopped (04/27/22 1551)     LOS: 3 days   Time spent: 47min  Azura Tufaro C Jaclyne Haverstick, DO [facility]  If 7PM-7AM, please contact night-coverage [email or web address]  04/29/2022, 8:20 AM

## 2022-04-29 NOTE — TOC Progression Note (Addendum)
Transition of Care [facility]) - Progression Note    Patient Details  Name: Patrick Rocha MRN: [id number] Date of Birth: May 11, 1945  Transition of Care [facility]) CM/SW [place], LCSW Phone Number: 04/29/2022, 3:24 PM  Clinical Narrative:    A referral to [facility] made by pt's ALF prior to hospital admission. [facility] following for final determination of hospice vs home health w/ palliative care.   Expected Discharge Plan: Assisted Living Barriers to Discharge: Continued Medical Work up  Expected Discharge Plan and Services In-house Referral: NA Discharge Planning Services: CM Consult Post Acute Care Choice: Resumption of Svcs/PTA Provider Living arrangements for the past 2 months: Assisted Living Facility                 DME Arranged: N/A DME Agency: NA       HH Arranged: NA HH Agency: NA         Social Determinants of Health (SDOH) Interventions SDOH Screenings   Food Insecurity: No Food Insecurity (04/27/2022)  Housing: High Risk (04/27/2022)  Transportation Needs: Unmet Transportation Needs (04/27/2022)  Utilities: Not At Risk (04/27/2022)  Tobacco Use: Medium Risk (04/26/2022)    Readmission Risk Interventions    04/28/2022    2:44 PM  Readmission Risk Prevention Plan  Transportation Screening Complete  PCP or Specialist Appt within 3-5 Days Complete  HRI or [place] Complete  Social Work Consult for [place] Planning/Counseling Complete  Palliative Care Screening Complete  Medication Review [employer]) Complete

## 2022-04-29 NOTE — Telephone Encounter (Signed)
T/C from pt's daughter, Patrick Rocha, stating pt is currently in the hospital and she is asking if Dr Mickeal Skinner can make a referral to Hospice.   A nurse at [facility] where he resides had made the suggestion.Daughter has already spoke to Enid Skeens at [facility] and he said he just needs a referral to begin the process.

## 2022-04-30 DIAGNOSIS — R69 Illness, unspecified: Secondary | ICD-10-CM

## 2022-04-30 DIAGNOSIS — I4819 Other persistent atrial fibrillation: Secondary | ICD-10-CM

## 2022-04-30 DIAGNOSIS — D649 Anemia, unspecified: Secondary | ICD-10-CM

## 2022-04-30 LAB — CBC
HCT: 26 % — ABNORMAL LOW (ref 39.0–52.0)
Hemoglobin: 8 g/dL — ABNORMAL LOW (ref 13.0–17.0)
MCH: 31.3 pg (ref 26.0–34.0)
MCHC: 30.8 g/dL (ref 30.0–36.0)
MCV: 101.6 fL — ABNORMAL HIGH (ref 80.0–100.0)
Platelets: 419 10*3/uL — ABNORMAL HIGH (ref 150–400)
RBC: 2.56 MIL/uL — ABNORMAL LOW (ref 4.22–5.81)
RDW: 15.9 % — ABNORMAL HIGH (ref 11.5–15.5)
WBC: 7.7 10*3/uL (ref 4.0–10.5)
nRBC: 0 % (ref 0.0–0.2)

## 2022-04-30 LAB — BASIC METABOLIC PANEL
Anion gap: 9 (ref 5–15)
BUN: 20 mg/dL (ref 8–23)
CO2: 24 mmol/L (ref 22–32)
Calcium: 7.7 mg/dL — ABNORMAL LOW (ref 8.9–10.3)
Chloride: 105 mmol/L (ref 98–111)
Creatinine, Ser: 1.69 mg/dL — ABNORMAL HIGH (ref 0.61–1.24)
GFR, Estimated: 42 mL/min — ABNORMAL LOW (ref >60)
Glucose, Bld: 107 mg/dL — ABNORMAL HIGH (ref 70–99)
Potassium: 4.1 mmol/L (ref 3.5–5.1)
Sodium: 138 mmol/L (ref 135–145)

## 2022-04-30 LAB — GLUCOSE, CAPILLARY
Glucose-Capillary: 96 mg/dL (ref 70–99)
Glucose-Capillary: 151 mg/dL — ABNORMAL HIGH (ref 70–99)
Glucose-Capillary: 101 mg/dL — ABNORMAL HIGH (ref 70–99)
Glucose-Capillary: 177 mg/dL — ABNORMAL HIGH (ref 70–99)

## 2022-04-30 LAB — IRON AND TIBC
Iron: 63 ug/dL (ref 45–182)
Saturation Ratios: 41 % — ABNORMAL HIGH (ref 17.9–39.5)
TIBC: 155 ug/dL — ABNORMAL LOW (ref 250–450)
UIBC: 92 ug/dL

## 2022-04-30 LAB — FERRITIN: Ferritin: 363 ng/mL — ABNORMAL HIGH (ref 24–336)

## 2022-04-30 NOTE — Progress Notes (Signed)
[facility])  Plan was for patient to be admitted at facility with [facility] hospice services. However, patient was transferred to the ED prior to admission.    Patient is not an active patient with [facility] services. [facility] will continue to follow for any discharge planning needs and to coordinate continuation of hospice care if family chooses to proceed.     Please call with any questions/concerns.    Thank you for the opportunity to participate in this patient's care.   Phillis Haggis, MSW [facility] Liaison  [phone]

## 2022-04-30 NOTE — Progress Notes (Signed)
PROGRESS NOTE    Patrick Rocha  [id number] DOB: 1945/08/12 DOA: 04/26/2022  PCP: Elliot Dally, MD  Brief Narrative:  Patrick Rocha is a 77 y.o. male with medical history significant of A-fib status post ablation, type 2 diabetes, hypothyroidism, hypertension brain mass status post biopsy who presents emergency department from his assisted living facility.  Patient was discharged from a skilled nursing facility the day of admission.  Upon presenting to assisted living facility patient had multiple episodes of syncope, lightheadedness, unsteadiness and collapsed to the floor.  Patient had similar episode 04/05/2022 secondary to hypotension and RSV infection.  Patient again was found to be hypotensive at intake, patient and family indicate poor p.o. intake over the past few days to week.  Patient admitted for further evaluation and workup.  Assessment & Plan:   Principal Problem:   Failure to thrive in adult Active Problems:   Atrial fibrillation with RVR (HCC)   Syncope and collapse   Leg edema  Acute on recurrent episodes of syncope, likely orthostatic  -Patient continues to have episodes of orthostatic hypotension which are moderately symptomatic with dizziness and loss of balance.  He was not syncopal or presyncopal today however. -Resume compression stockings- Unable to tolerate previously due to edema -Echo previously performed was without indication of heart failure -PT OT ongoing current recommendations for home health PT -Patient still having symptoms with lower extremity TED hose, will order thigh-high in hopes this improves his orthostatic symptoms. -Appreciate cardiology's help managing BP, Afib, and orthostatic hypotension.  A-fib with RVR, asymptomatic, ongoing, rate improved -Known history of A-fib on carvedilol 12.5 and diltiazem 240 prior to admission -Current regimen includes metoprolol, diltiazem -Consider amiodarone per cardiology if rate  control continues to be an issue -Appreciate cardiology's intervention given his multiple providers/admissions/medication changes for definitive management of volume status/BP/afibRVR. -Xarelto currently on hold, discussed with cardiology, I do feel that patient's risk of bleeding in the setting of brain mass and fall risk vastly outweigh his risk of acute stroke in the setting of A-fib.  Certainly this conversation can be held with patient's primary care team outpatient however at this time especially during his critical illness we will discontinue the Xarelto.   Anemia, somewhat acute versus subacute, unclear etiology  -Baseline hemoglobin 4 months ago was normal at 13-15 -Hemoglobin has down trended over the past 3 to 4 months currently 7-8.  No signs or symptoms of bleeding at this time -Hemoccult stool test pending -Concern this may be advancing chronic anemia of chronic disease given patient's multiple comorbidities including CKD 3B. -Iron panel low TIBC but elevated saturation, iron within normal limits -Hold off transfusion at this time unless hemoglobin less than 7, he becomes symptomatic, or overt bleeding is discovered.  Polypharmacy, high risk -Patient has had multiple hospitalizations transition to SNF and back to assisted living with apparently multiple episodes of medication changes so much so now that the patient and family are worried patient is receiving the wrong medications at facility given his medication list presented here is different than his prior. Patient is no longer taking benazepril, he has not been given his Synthroid in 2 weeks despite it being active on his med list. - There is concern multiple of patient's medications are being inappropriately held or administered.  Will go over med list with patient and family very closely at discharge.  Recommend no other medication changes after discharge until he follows up with his PCP who is well aware of the situation.  Brain  mass, recently discovered  -Noted during December 2023 hospitalization.  Patient on steroids at the time, no indication for procedure at that time. -Unclear if patient's mass with 3 mm midline shift is associated with episodes of syncope as above although given orthostatics/hypotension this is unlikely.  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    Hypomagnesemia- -replaced, follow repeat labs   Hyperlipidemia-continue Lipitor   Depression-continue escitalopram   GERD-continue Protonix   BPH-continue Flomax   Type 2 diabetes, uncontrolled with hyperglycemia -A1c 6.3, continue sliding scale insulin/hypoglycemic protocol  Goals of care -Palliative care consulted - appreciate insight and recommendations -Lengthy discussion today with family over the phone about patient's prognosis.  They understand that given patient's age and multiple comorbidities his risk of morbidity mortality are elevated however previous discussion over hospice is likely somewhat aggressive, would recommend palliative follow-up and further discussion on goals of care as patient remains full code but has discussed with family previously that there were some heroic measures he would not want to undergo, unclear what these were. -Family following up with palliative care today to go over goals of care with patient and children to ensure we are honoring his wishes and goals moving forward.  DVT prophylaxis: Xarelto discontinued, transition to heparin subQ Code Status: Full Family Communication: Son and daughter at bedside  Status is: inpt  Dispo: The patient is from: ALF              Anticipated d/c is to: TBD current recommendations: HHPT              Anticipated d/c date is: TBD              Patient currently NOT medically stable for  discharge  Consultants:  Cardiology  Procedures:  None  Antimicrobials:  None   Subjective: No acute issues or events overnight, patient continues to have episodes of orthostatic hypotension with profound symptoms of dizziness lightheadedness and near syncope.  He is rightfully frustrated that his improvement is slow going but we discussed he has been in and out of the hospital now for months and this may be something that we will take more than a few days to recover from.  Objective: Vitals:   04/29/22 1800 04/29/22 2124 04/30/22 0513 04/30/22 0514  BP:  123/69 122/71   Pulse:  97 92   Resp:  16 18   Temp:  98 F (36.7 C) (!) 97.5 F (36.4 C)   TempSrc:  Oral Oral   SpO2: 98% 92% (!) 84% 91%  Weight:      Height:        Intake/Output Summary (Last 24 hours) at 04/30/2022 0800 Last data filed at 04/30/2022 0500 Gross per 24 hour  Intake 1330 ml  Output 450 ml  Net 880 ml    Filed Weights   04/26/22 1326 04/26/22 2100 04/27/22 0500  Weight: 120 kg 117.8 kg 114.7 kg    Examination:  General:  Pleasantly resting in bed, No acute distress. HEENT:  Normocephalic atraumatic.  Sclerae nonicteric, noninjected.  Extraocular movements intact bilaterally. Neck:  Without mass or deformity.  Trachea is midline. Lungs:  Clear to auscultate bilaterally without rhonchi, wheeze, or rales. Heart: Irregularly irregular without overt murmurs rubs Abdomen:  Soft, nontender, nondistended.  Without guarding or rebound. Extremities: Without cyanosis, clubbing,  TED hose bilateral lower extremities clean dry intact, without overt pitting edema more proximally; bilateral upper extremity 1+ pitting edema noted  Data Reviewed: I have personally reviewed following labs and imaging studies  CBC: Recent Labs  Lab 04/26/22 1410 04/26/22 1623 04/28/22 0252 04/29/22 0845 04/30/22 0542  WBC 7.8 8.1 7.3 7.9 7.7  NEUTROABS 6.0  --   --   --   --   HGB 8.7* 8.8* 7.6* 8.8* 8.0*  HCT 27.7*  27.8* 23.4* 26.9* 26.0*  MCV 99.6 99.3 99.2 97.5 101.6*  PLT 415* 451* 405* 473* 419*    Basic Metabolic Panel: Recent Labs  Lab 04/26/22 1410 04/26/22 1623 04/28/22 0252 04/29/22 0557 04/30/22 0542  NA 138  --  135 136 138  K 2.7*  --  3.2* 3.4* 4.1  CL 102  --  101 103 105  CO2 23  --  27 22 24   GLUCOSE 151*  --  254* 150* 107*  BUN 21  --  24* 20 20  CREATININE 2.08* 2.02* 1.83* 1.75* 1.69*  CALCIUM 6.5*  --  6.9* 7.2* 7.7*  MG 1.1*  --  2.5*  --   --   PHOS 2.9  --   --   --   --     GFR: Estimated Creatinine Clearance: 47.2 mL/min (A) (by C-G formula based on SCr of 1.69 mg/dL (H)).  Liver Function Tests: Recent Labs  Lab 04/26/22 1410  AST 35  ALT 25  ALKPHOS 57  BILITOT 1.3*  PROT 5.5*  ALBUMIN 1.9*    Coagulation Profile: Recent Labs  Lab 04/26/22 1410  INR 1.2    CBG: Recent Labs  Lab 04/29/22 0722 04/29/22 1155 04/29/22 1703 04/29/22 2126 04/30/22 0732  GLUCAP 140* 251* 169* 215* 96    Sepsis Labs: Recent Labs  Lab 04/26/22 1410 04/26/22 1623  LATICACIDVEN 1.9 1.6     Recent Results (from the past 240 hour(s))  MRSA Next Gen by PCR, Nasal     Status: None   Collection Time: 04/26/22  6:20 PM   Specimen: Nasal Mucosa; Nasal Swab  Result Value Ref Range Status   MRSA by PCR Next Gen NOT DETECTED NOT DETECTED Final    Comment: (NOTE) The GeneXpert MRSA Assay (FDA approved for NASAL specimens only), is one component of a comprehensive MRSA colonization surveillance program. It is not intended to diagnose MRSA infection nor to guide or monitor treatment for MRSA infections. Test performance is not FDA approved in patients less than 3 years old. Performed at [facility], [place] [address], [place], [place] [address]     Radiology Studies: No results found.  Scheduled Meds:  atorvastatin  40 mg Oral Daily   Chlorhexidine Gluconate Cloth  6 each Topical QHS   diltiazem  120 mg Oral Q12H   escitalopram  10 mg  Oral Daily   heparin  5,000 Units Subcutaneous Q8H   insulin aspart  0-24 Units Subcutaneous TID WC   insulin glargine-yfgn  12 Units Subcutaneous QHS   levothyroxine  150 mcg Oral QAC breakfast   metoprolol tartrate  25 mg Oral BID   pantoprazole  40 mg Oral BID   tamsulosin  0.4 mg Oral Daily   Continuous Infusions:  diltiazem (CARDIZEM) infusion Stopped (04/27/22 1551)     LOS: 4 days   Time spent: 22min  Mayukha Symmonds C Jonathon Tan, DO [facility]  If 7PM-7AM, please contact night-coverage [email or web address]  04/30/2022, 8:00 AM

## 2022-04-30 NOTE — Progress Notes (Signed)
[facility])   Plan was for patient to be admitted at facility with [facility] hospice services. However, patient was transferred to the ED prior to admission. Per PMT provider, Claretta Fraise, family has opted to discharge with hospice services once medically cleared for DC.    Patient is not an active patient with [facility] services. [facility] will continue to follow for any discharge planning needs and to coordinate continuation of hospice care if family chooses to proceed.     Please call with any questions/concerns.    Thank you for the opportunity to participate in this patient's care.   Phillis Haggis, MSW [facility] Liaison  [phone]

## 2022-04-30 NOTE — Progress Notes (Signed)
Progress Note  Patient Name: Patrick Rocha Date of Encounter: 04/30/2022  Primary Cardiologist: New to [facility]  Interval Summary   Chart reviewed.  Patient seen by palliative care team with plan for further discussion today including family members.  He states that he feels weak as before, still some weeping from his skin.  No sense of palpitations however.  Has not been passing urine.  Vital Signs    Vitals:   04/29/22 1800 04/29/22 2124 04/30/22 0513 04/30/22 0514  BP:  123/69 122/71   Pulse:  97 92   Resp:  16 18   Temp:  98 F (36.7 C) (!) 97.5 F (36.4 C)   TempSrc:  Oral Oral   SpO2: 98% 92% (!) 84% 91%  Weight:      Height:        Intake/Output Summary (Last 24 hours) at 04/30/2022 0831 Last data filed at 04/30/2022 0500 Gross per 24 hour  Intake 1330 ml  Output 450 ml  Net 880 ml   Filed Weights   04/26/22 1326 04/26/22 2100 04/27/22 0500  Weight: 120 kg 117.8 kg 114.7 kg    Physical Exam   GEN: No acute distress..   Neck: No JVD. Cardiac: Irregularly irregular without gallop.  Respiratory: Nonlabored.  Decreased breath sounds without wheezing. MS: Body wall edema.  ECG/Telemetry    Telemetry shows atrial fibrillation with heart rate under 100 bpm at rest.  Labs    Chemistry Recent Labs  Lab 04/26/22 1410 04/26/22 1623 04/28/22 0252 04/29/22 0557 04/30/22 0542  NA 138  --  135 136 138  K 2.7*  --  3.2* 3.4* 4.1  CL 102  --  101 103 105  CO2 23  --  27 22 24   GLUCOSE 151*  --  254* 150* 107*  BUN 21  --  24* 20 20  CREATININE 2.08*   < > 1.83* 1.75* 1.69*  CALCIUM 6.5*  --  6.9* 7.2* 7.7*  PROT 5.5*  --   --   --   --   ALBUMIN 1.9*  --   --   --   --   AST 35  --   --   --   --   ALT 25  --   --   --   --   ALKPHOS 57  --   --   --   --   BILITOT 1.3*  --   --   --   --   GFRNONAA 33*   < > 38* 40* 42*  ANIONGAP 13  --  7 11 9    < > = values in this interval not displayed.    Hematology Recent Labs  Lab  04/28/22 0252 04/29/22 0845 04/30/22 0542  WBC 7.3 7.9 7.7  RBC 2.36* 2.76* 2.56*  HGB 7.6* 8.8* 8.0*  HCT 23.4* 26.9* 26.0*  MCV 99.2 97.5 101.6*  MCH 32.2 31.9 31.3  MCHC 32.5 32.7 30.8  RDW 15.8* 15.7* 15.9*  PLT 405* 473* 419*   Cardiac Enzymes Recent Labs  Lab 04/26/22 1410 04/26/22 1623  TROPONINIHS 38* 40*   Cardiac Studies   Echocardiogram 04/06/2022:  1. Left ventricular ejection fraction, by estimation, is 55 to 60%. The  left ventricle has normal function. The left ventricle has no regional  wall motion abnormalities. There is mild concentric left ventricular  hypertrophy. Left ventricular diastolic  parameters are indeterminate.   2. Right ventricular systolic function is normal. The right  ventricular  size is normal.   3. Left atrial size was mildly dilated.   4. Right atrial size was mildly dilated.   5. The mitral valve is normal in structure. Trivial mitral valve  regurgitation. No evidence of mitral stenosis.   6. The aortic valve is tricuspid. There is mild calcification of the  aortic valve. Aortic valve regurgitation is trivial. No aortic stenosis is  present.   7. The inferior vena cava is dilated in size with >50% respiratory  variability, suggesting right atrial pressure of 8 mmHg.   Assessment & Plan   1.  Persistent atrial fibrillation presenting with RVR in association with hypotension and suspected volume contraction.  CHA2DS2-VASc score is 4.  Currently off anticoagulation in light of brain mass.  Heart rate control medications currently include Cardizem SR 120 mg twice daily and Lopressor 25 mg twice daily.  Echocardiogram shows LVEF 55 to 60% range with mild biatrial enlargement, no significant valvular disease.  2.  Syncope most likely associated with orthostatic hypotension and some degree of volume contraction complicated by third spacing and low albumin state.  Recent orthostatic vital signs were negative by blood pressure, positive by  heart rate, but trend is better.  Diuretics have been held.  3.  Anemia, possibly mixed etiology, workup per primary team.  Hemoglobin 8.0 down from 8.8 yesterday.  Iron 63 is low end of normal range with saturation 41%.  Does have CKD stage IIIb which could be a contributor as well.  4.  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  This may represent a barrier to resuming anticoagulation, will defer to recommendations from oncology/neurosurgery.   Continue present heart rate control regimen including Cardizem SR 120 mg twice daily and Lopressor 25 mg twice daily.  Continue to hold off on diuretic for now.  Signed, Rozann Lesches, MD  04/30/2022, 8:31 AM

## 2022-04-30 NOTE — Progress Notes (Signed)
Palliative Medicine Progress Note   Patient Name: Patrick Rocha       Date: 04/30/2022 DOB: 1946/01/22  Age: 77 y.o. MRN#: [id number] Attending Physician: Little Ishikawa, MD Primary Care Physician: Elliot Dally, MD Admit Date: 04/26/2022  Reason for Consultation/Follow-up: {Reason for Consult:23484}  HPI/Patient Profile: "Patrick Rocha" Schuff is a 77 y.o. male  with past medical history of A-fib status post ablation, type 2 diabetes, hypothyroidism, hypertension, and recently discovered brain mass who presented to [facility] ED on 04/26/2022 with syncope. He was recently hospitalized on 04/05/22 with a similar episode thought to be secondary to hypotension and RSV infection.  He was discharged to SNF for rehab on 04/09/22. After completing rehab, he transferred to ALF on 04/26/22 and on arrival began having multiple syncopal episodes and had collapsed on the floor.  He was again found to be hypotensive on presentation.  Family indicates poor p.o. intake over the past few days.  Patient was admitted for further evaluation of syncope and failure to thrive.   Palliative Medicine was consulted for goals of care.  Subjective:  Advanced directives, concepts specific to code status, artifical feeding and hydration, and rehospitalization were considered and discussed.   I completed a MOST form today. The patient and family outlined their wishes for the following treatment decisions:  Cardiopulmonary Resuscitation: Do Not Attempt Resuscitation (DNR/No CPR)  Medical Interventions: Limited Additional Interventions: Use medical treatment, IV fluids and cardiac monitoring as indicated, DO NOT USE intubation or mechanical ventilation. May consider use of less invasive airway support such as BiPAP or CPAP.  Also provide comfort measures. Transfer to the hospital if indicated. Avoid intensive care.   Antibiotics: Determine use of limitation of antibiotics when infection occurs  IV Fluids: IV fluids for a defined trial period  Feeding Tube: No feeding tube        Objective:  Physical Exam          Vital Signs: BP 122/71   Pulse 92   Temp (!) 97.5 F (36.4 C) (Oral)   Resp 18   Ht 5\' 9"  (1.753 m)   Wt 114.7 kg   SpO2 91%   BMI 37.34 kg/m  SpO2: SpO2: 91 % O2 Device: O2 Device: Room Air O2 Flow Rate: O2 Flow Rate (L/min): 2 L/min  Intake/output summary:  Intake/Output Summary (  Last 24 hours) at 04/30/2022 1123 Last data filed at 04/30/2022 0800 Gross per 24 hour  Intake 1090 ml  Output 750 ml  Net 340 ml    LBM: Last BM Date : 04/29/22     Palliative Assessment/Data: ***     Palliative Medicine Assessment & Plan   Assessment: Principal Problem:   Failure to thrive in adult Active Problems:   Atrial fibrillation with RVR (HCC)   Syncope and collapse   Leg edema    Recommendations/Plan: Code status changed to DNR/DNI MOST form completed - original given to family and copy made to scan into EMR  Goals of Care and Additional Recommendations: Limitations on Scope of Treatment: {Recommended Scope and Preferences:21019}  Code Status:   Prognosis:  {Palliative Care Prognosis:23504}  Discharge Planning: {Palliative dispostion:23505}  Care plan was discussed with ***  Thank you for allowing the Palliative Medicine Team to assist in the care of this patient.   ***   Lavena Bullion, NP   Please contact Palliative Medicine Team phone at [phone] for questions and concerns.  For individual providers, please see AMION.

## 2022-05-01 DIAGNOSIS — I4819 Other persistent atrial fibrillation: Secondary | ICD-10-CM | POA: Diagnosis not present

## 2022-05-01 LAB — BASIC METABOLIC PANEL
Anion gap: 7 (ref 5–15)
BUN: 20 mg/dL (ref 8–23)
CO2: 25 mmol/L (ref 22–32)
Calcium: 7.8 mg/dL — ABNORMAL LOW (ref 8.9–10.3)
Chloride: 104 mmol/L (ref 98–111)
Creatinine, Ser: 1.63 mg/dL — ABNORMAL HIGH (ref 0.61–1.24)
GFR, Estimated: 44 mL/min — ABNORMAL LOW (ref >60)
Glucose, Bld: 170 mg/dL — ABNORMAL HIGH (ref 70–99)
Potassium: 4.1 mmol/L (ref 3.5–5.1)
Sodium: 136 mmol/L (ref 135–145)

## 2022-05-01 LAB — CBC
HCT: 24 % — ABNORMAL LOW (ref 39.0–52.0)
Hemoglobin: 7.6 g/dL — ABNORMAL LOW (ref 13.0–17.0)
MCH: 31.5 pg (ref 26.0–34.0)
MCHC: 31.7 g/dL (ref 30.0–36.0)
MCV: 99.6 fL (ref 80.0–100.0)
Platelets: 362 10*3/uL (ref 150–400)
RBC: 2.41 MIL/uL — ABNORMAL LOW (ref 4.22–5.81)
RDW: 16.2 % — ABNORMAL HIGH (ref 11.5–15.5)
WBC: 6.5 10*3/uL (ref 4.0–10.5)
nRBC: 0.3 % — ABNORMAL HIGH (ref 0.0–0.2)

## 2022-05-01 LAB — GLUCOSE, CAPILLARY
Glucose-Capillary: 138 mg/dL — ABNORMAL HIGH (ref 70–99)
Glucose-Capillary: 162 mg/dL — ABNORMAL HIGH (ref 70–99)
Glucose-Capillary: 129 mg/dL — ABNORMAL HIGH (ref 70–99)
Glucose-Capillary: 258 mg/dL — ABNORMAL HIGH (ref 70–99)

## 2022-05-01 MED ORDER — POLYVINYL ALCOHOL 1.4 % OP SOLN
1.0000 [drp] | OPHTHALMIC | Status: DC | PRN
  Administered 2022-05-01 – 2022-05-04 (×7): 1 [drp] via OPHTHALMIC
  Filled 2022-05-01: qty 15

## 2022-05-01 NOTE — Progress Notes (Signed)
PROGRESS NOTE    Patrick Rocha  [id number] DOB: 14-Mar-1945 DOA: 04/26/2022  PCP: Elliot Dally, MD  Brief Narrative:  Patrick Rocha is a 77 y.o. male with medical history significant of A-fib status post ablation, type 2 diabetes, hypothyroidism, hypertension brain mass status post biopsy who presents emergency department from his assisted living facility.  Patient was discharged from a skilled nursing facility the day of admission.  Upon presenting to assisted living facility patient had multiple episodes of syncope, lightheadedness, unsteadiness and collapsed to the floor.  Patient had similar episode 04/05/2022 secondary to hypotension and RSV infection.  Patient again was found to be hypotensive at intake, patient and family indicate poor p.o. intake over the past few days to week.  Patient admitted for further evaluation and workup.  Patient slowly improving over the past 72 hours, his initial syncope versus presyncope with orthostatics has resolved, able to ambulate yesterday with minimal to no change in orthostatic vital signs or symptoms.  Will continue to ambulate patient with physical therapy for further discussion on disposition given his recent SNF admission having just been discharged to assisted living for 24 hours prior to readmission to the hospital.  Assessment & Plan:   Principal Problem:   Failure to thrive in adult Active Problems:   Atrial fibrillation with RVR (HCC)   Syncope and collapse   Leg edema  Acute on recurrent episodes of syncope, likely orthostatic  -Patient continues to have episodes of orthostatic hypotension which are moderately symptomatic with dizziness and loss of balance.  He was not syncopal or presyncopal today however. -Echo previously performed was without indication of heart failure -PT OT ongoing current recommendations for home health PT -Patient finally improving somewhat over the past 24 hours, above the knee TED  hose to be placed today.  Would consider low-dose midodrine if symptoms do not continue to improve, may require permissive hypertension while supine to ensure no further episodes or symptoms of syncope. -Appreciate cardiology's help managing BP, Afib, and orthostatic hypotension. -Bilateral upper extremity edema -likely venous stasis, holding Lasix given above  A-fib with RVR, asymptomatic, ongoing, rate improved -Known history of A-fib on carvedilol 12.5 and diltiazem 240 prior to admission -Current regimen includes metoprolol, diltiazem -Consider amiodarone per cardiology if rate control continues to be an issue -Appreciate cardiology's intervention given his multiple providers/admissions/medication changes for definitive management of volume status/BP/afibRVR. -Xarelto currently on hold, discussed with cardiology, I do feel that patient's risk of bleeding in the setting of brain mass and fall risk vastly outweigh his risk of acute stroke in the setting of A-fib.  Certainly this conversation can be held with patient's primary care team outpatient however at this time especially during his critical illness we will discontinue the Xarelto.   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 Hemoccult stool test negative, no signs or symptoms of bleeding -Concern this may be advancing chronic anemia of chronic disease given patient's multiple comorbidities including CKD 3B. -Iron panel: low TIBC but elevated saturation, iron within normal limits -Hold off transfusion at this time unless hemoglobin less than 7, he becomes symptomatic, or overt bleeding is discovered.  Polypharmacy, high risk -Patient has had multiple hospitalizations transition to SNF and back to assisted living with apparently multiple episodes of medication  changes so much so now that the patient  and family are worried patient is receiving the wrong medications at facility given his medication list presented here is different than his prior. Patient is no longer taking benazepril, he has not been given his Synthroid in 2 weeks despite it being active on his previous med list. - There is concern multiple of patient's medications are being inappropriately held or administered.  Will go over med list with patient and family very closely again at discharge.  Recommend no other medication changes after discharge until he follows up with his PCP who is well aware of the situation.  Brain mass, recently discovered  -Noted during December 2023 hospitalization.  Patient on steroids at the time, no indication for procedure at that time. -Unclear if patient's mass with 3 mm midline shift is associated with episodes of syncope as above although given orthostatics/hypotension this is unlikely. 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   Hypomagnesemia- -replaced, follow repeat labs Hyperlipidemia-continue Lipitor Depression-continue escitalopram GERD-continue Protonix BPH-continue Flomax   Type 2 diabetes, uncontrolled with hyperglycemia -A1c 6.3, continue sliding scale insulin/hypoglycemic protocol  Goals of care -Palliative care consulted - appreciate insight and recommendations -Lengthy discussion today with family over the phone about patient's prognosis.  They understand that given patient's age and multiple comorbidities his risk of morbidity mortality are elevated however previous discussion over hospice is likely somewhat aggressive, would  recommend palliative follow-up and further discussion on goals of care as patient remains full code but has discussed with family previously that there were some heroic measures he would not want to undergo, unclear what these were. -Family and patient working with palliative care to further discuss future goals of care.  We explained again to patient today that this was meant to be preemptive as well as to plan for the future, he is worried that he is currently and actively dying which we discussed was not our intention with this discussion..  DVT prophylaxis: Xarelto discontinued, transition to heparin subQ Code Status: Full Family Communication: Son and daughter updated at bedside  Status is: inpt  Dispo: The patient is from: ALF              Anticipated d/c is to: TBD current recommendations: Back to ALF with HHPT              Anticipated d/c date is: 24-48 pending orthostatic symptoms/volume management              Patient currently NOT medically stable for discharge  Consultants:  Cardiology  Procedures:  None  Antimicrobials:  None   Subjective: No acute issues or events overnight, patient able to ambulate somewhat yesterday with staff with minimal symptoms,, he remains somewhat depressed given his recurrent hospitalizations.  He is also somewhat anxious given yesterday's discussion with palliative care as he was under the impression that he is actively dying which we discussed was not the case.  He certainly has multiple comorbid conditions but appears to be somewhat improving at this time and will likely discharge home in the next few days.  Objective: Vitals:   04/30/22 1952 04/30/22 2122 05/01/22 0503 05/01/22 0529  BP: (!) 121/56 134/62  120/75  Pulse: (!) 101 99 81 86  Resp: 18  18 18  Temp: (!) 97.3 F (36.3 C)  (!) 97.4 F (36.3 C) (!) 97.4 F (36.3 C)  TempSrc: Oral  Oral Oral  SpO2: 96%  100% 93%  Weight:      Height:        Intake/Output Summary (Last 24  hours) at 05/01/2022 0803 Last data filed at 05/01/2022 0600 Gross per 24 hour  Intake --  Output 800 ml  Net -800 ml    Filed Weights   04/26/22 1326 04/26/22 2100 04/27/22 0500  Weight: 120 kg 117.8 kg 114.7 kg    Examination:  General:  Pleasantly resting in bed, No acute distress. HEENT:  Normocephalic atraumatic.  Sclerae nonicteric, noninjected.  Extraocular movements intact bilaterally. Neck:  Without mass or deformity.  Trachea is midline. Lungs:  Clear to auscultate bilaterally without rhonchi, wheeze, or rales. Heart: Irregularly irregular without overt murmurs rubs Abdomen:  Soft, nontender, nondistended.  Without guarding or rebound. Extremities: Without cyanosis, clubbing, TED hose bilateral lower extremities clean dry intact, 2+ bilateral upper extremity pitting edema, generalized weeping in the distal arms  Data Reviewed: I have personally reviewed following labs and imaging studies  CBC: Recent Labs  Lab 04/26/22 1410 04/26/22 1623 04/28/22 0252 04/29/22 0845 04/30/22 0542 05/01/22 0500  WBC 7.8 8.1 7.3 7.9 7.7 6.5  NEUTROABS 6.0  --   --   --   --   --   HGB 8.7* 8.8* 7.6* 8.8* 8.0* 7.6*  HCT 27.7* 27.8* 23.4* 26.9* 26.0* 24.0*  MCV 99.6 99.3 99.2 97.5 101.6* 99.6  PLT 415* 451* 405* 473* 419* [id number]    Basic Metabolic Panel: Recent Labs  Lab 04/26/22 1410 04/26/22 1623 04/28/22 0252 04/29/22 0557 04/30/22 0542 05/01/22 0500  NA 138  --  135 136 138 136  K 2.7*  --  3.2* 3.4* 4.1 4.1  CL 102  --  101 103 105 104  CO2 23  --  27 22 24 25   GLUCOSE 151*  --  254* 150* 107* 170*  BUN 21  --  24* 20 20 20   CREATININE 2.08* 2.02* 1.83* 1.75* 1.69* 1.63*  CALCIUM 6.5*  --  6.9* 7.2* 7.7* 7.8*  MG 1.1*  --  2.5*  --   --   --   PHOS 2.9  --   --   --   --   --     GFR: Estimated Creatinine Clearance: 48.9 mL/min (A) (by C-G formula based on SCr of 1.63 mg/dL (H)).  Liver Function Tests: Recent Labs  Lab 04/26/22 1410  AST 35  ALT 25  ALKPHOS  57  BILITOT 1.3*  PROT 5.5*  ALBUMIN 1.9*    Coagulation Profile: Recent Labs  Lab 04/26/22 1410  INR 1.2    CBG: Recent Labs  Lab 04/29/22 2126 04/30/22 0732 04/30/22 1147 04/30/22 1648 04/30/22 2030  GLUCAP 215* 96 151* 101* 177*    Sepsis Labs: Recent Labs  Lab 04/26/22 1410 04/26/22 1623  LATICACIDVEN 1.9 1.6     Recent Results (from the past 240 hour(s))  MRSA Next Gen by PCR, Nasal     Status: None   Collection Time: 04/26/22  6:20 PM   Specimen: Nasal Mucosa; Nasal Swab  Result Value Ref Range Status   MRSA by PCR Next Gen NOT DETECTED NOT DETECTED Final    Comment: (NOTE) The GeneXpert MRSA Assay (FDA approved for NASAL specimens only), is one component of a comprehensive MRSA colonization surveillance program. It is not intended  to diagnose MRSA infection nor to guide or monitor treatment for MRSA infections. Test performance is not FDA approved in patients less than 60 years old. Performed at [facility], [place] [address] [place], [place] [address]     Radiology Studies: No results found.  Scheduled Meds:  atorvastatin  40 mg Oral Daily   Chlorhexidine Gluconate Cloth  6 each Topical QHS   diltiazem  120 mg Oral Q12H   escitalopram  10 mg Oral Daily   heparin  5,000 Units Subcutaneous Q8H   insulin aspart  0-24 Units Subcutaneous TID WC   insulin glargine-yfgn  12 Units Subcutaneous QHS   levothyroxine  150 mcg Oral QAC breakfast   metoprolol tartrate  25 mg Oral BID   pantoprazole  40 mg Oral BID   tamsulosin  0.4 mg Oral Daily   Continuous Infusions:  diltiazem (CARDIZEM) infusion Stopped (04/27/22 1551)     LOS: 5 days   Time spent: 17min  Mallerie Blok C Rane Dumm, DO [facility]  If 7PM-7AM, please contact night-coverage [email or web address]  05/01/2022, 8:03 AM

## 2022-05-01 NOTE — Progress Notes (Signed)
Progress Note  Patient Name: Patrick Rocha Date of Encounter: 05/01/2022  Primary Cardiologist: New to [facility]  Interval Summary   Chart reviewed since rounding note yesterday.  Plan is ultimately for hospice care following discharge.  Orthostatics yesterday positive by heart rate (in atrial fibrillation), but no frank hypotension.  Vital Signs    Vitals:   04/30/22 1952 04/30/22 2122 05/01/22 0503 05/01/22 0529  BP: (!) 121/56 134/62  120/75  Pulse: (!) 101 99 81 86  Resp: 18  18 18   Temp: (!) 97.3 F (36.3 C)  (!) 97.4 F (36.3 C) (!) 97.4 F (36.3 C)  TempSrc: Oral  Oral Oral  SpO2: 96%  100% 93%  Weight:      Height:        Intake/Output Summary (Last 24 hours) at 05/01/2022 0857 Last data filed at 05/01/2022 0600 Gross per 24 hour  Intake --  Output 800 ml  Net -800 ml   Filed Weights   04/26/22 1326 04/26/22 2100 04/27/22 0500  Weight: 120 kg 117.8 kg 114.7 kg   ECG/Telemetry    Telemetry reviewed and shows atrial fibrillation with heart rate 90-110.  Labs    Chemistry Recent Labs  Lab 04/26/22 1410 04/26/22 1623 04/29/22 0557 04/30/22 0542 05/01/22 0500  NA 138   < > 136 138 136  K 2.7*   < > 3.4* 4.1 4.1  CL 102   < > 103 105 104  CO2 23   < > 22 24 25   GLUCOSE 151*   < > 150* 107* 170*  BUN 21   < > 20 20 20   CREATININE 2.08*   < > 1.75* 1.69* 1.63*  CALCIUM 6.5*   < > 7.2* 7.7* 7.8*  PROT 5.5*  --   --   --   --   ALBUMIN 1.9*  --   --   --   --   AST 35  --   --   --   --   ALT 25  --   --   --   --   ALKPHOS 57  --   --   --   --   BILITOT 1.3*  --   --   --   --   GFRNONAA 33*   < > 40* 42* 44*  ANIONGAP 13   < > 11 9 7    < > = values in this interval not displayed.    Hematology Recent Labs  Lab 04/29/22 0845 04/30/22 0542 05/01/22 0500  WBC 7.9 7.7 6.5  RBC 2.76* 2.56* 2.41*  HGB 8.8* 8.0* 7.6*  HCT 26.9* 26.0* 24.0*  MCV 97.5 101.6* 99.6  MCH 31.9 31.3 31.5  MCHC 32.7 30.8 31.7  RDW 15.7*  15.9* 16.2*  PLT 473* 419* 362   Cardiac Enzymes Recent Labs  Lab 04/26/22 1410 04/26/22 1623  TROPONINIHS 38* 40*   Cardiac Studies   Echocardiogram 04/06/2022:  1. Left ventricular ejection fraction, by estimation, is 55 to 60%. The  left ventricle has normal function. The left ventricle has no regional  wall motion abnormalities. There is mild concentric left ventricular  hypertrophy. Left ventricular diastolic  parameters are indeterminate.   2. Right ventricular systolic function is normal. The right ventricular  size is normal.   3. Left atrial size was mildly dilated.   4. Right atrial size was mildly dilated.   5. The mitral valve is normal in structure. Trivial mitral valve  regurgitation.  No evidence of mitral stenosis.   6. The aortic valve is tricuspid. There is mild calcification of the  aortic valve. Aortic valve regurgitation is trivial. No aortic stenosis is  present.   7. The inferior vena cava is dilated in size with >50% respiratory  variability, suggesting right atrial pressure of 8 mmHg.   Assessment & Plan   1.  Persistent atrial fibrillation presenting with RVR in association with hypotension and suspected volume contraction.  CHA2DS2-VASc score is 4.  Currently off anticoagulation in light of brain mass.  Heart rate control medications currently include Cardizem SR 120 mg twice daily and Lopressor 25 mg twice daily.  Echocardiogram shows LVEF 55 to 60% range with mild biatrial enlargement, no significant valvular disease.  2.  Syncope most likely associated with orthostatic hypotension and some degree of volume contraction complicated by third spacing and low albumin state.  Follow-up orthostatics yesterday were positive by heart rate in the setting of atrial fibrillation, but no frank hypotension.  3.  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  This likely represents a barrier to resuming anticoagulation, will defer to recommendations from  oncology/neurosurgery.  Continue with current heart rate control regimen including Cardizem SR 120 mg twice daily and Lopressor 25 mg twice daily.  Could consider addition of low-dose midodrine 2.5 mg twice daily if hypotension remains a recurring problem or limits regular medications.  For questions or updates, please contact [place] Please consult [email or web address] for contact info under   Signed, Rozann Lesches, MD  05/01/2022, 8:57 AM

## 2022-05-02 ENCOUNTER — Other Ambulatory Visit: Payer: Self-pay | Admitting: Internal Medicine

## 2022-05-02 DIAGNOSIS — R627 Adult failure to thrive: Secondary | ICD-10-CM | POA: Diagnosis not present

## 2022-05-02 DIAGNOSIS — C719 Malignant neoplasm of brain, unspecified: Secondary | ICD-10-CM

## 2022-05-02 LAB — BASIC METABOLIC PANEL
Anion gap: 9 (ref 5–15)
BUN: 20 mg/dL (ref 8–23)
CO2: 25 mmol/L (ref 22–32)
Calcium: 7.7 mg/dL — ABNORMAL LOW (ref 8.9–10.3)
Chloride: 102 mmol/L (ref 98–111)
Creatinine, Ser: 1.73 mg/dL — ABNORMAL HIGH (ref 0.61–1.24)
GFR, Estimated: 41 mL/min — ABNORMAL LOW (ref >60)
Glucose, Bld: 183 mg/dL — ABNORMAL HIGH (ref 70–99)
Potassium: 4.1 mmol/L (ref 3.5–5.1)
Sodium: 136 mmol/L (ref 135–145)

## 2022-05-02 LAB — CBC
HCT: 25.4 % — ABNORMAL LOW (ref 39.0–52.0)
Hemoglobin: 8.1 g/dL — ABNORMAL LOW (ref 13.0–17.0)
MCH: 31.4 pg (ref 26.0–34.0)
MCHC: 31.9 g/dL (ref 30.0–36.0)
MCV: 98.4 fL (ref 80.0–100.0)
Platelets: 335 10*3/uL (ref 150–400)
RBC: 2.58 MIL/uL — ABNORMAL LOW (ref 4.22–5.81)
RDW: 16.1 % — ABNORMAL HIGH (ref 11.5–15.5)
WBC: 6.1 10*3/uL (ref 4.0–10.5)
nRBC: 0.3 % — ABNORMAL HIGH (ref 0.0–0.2)

## 2022-05-02 LAB — GLUCOSE, CAPILLARY
Glucose-Capillary: 165 mg/dL — ABNORMAL HIGH (ref 70–99)
Glucose-Capillary: 136 mg/dL — ABNORMAL HIGH (ref 70–99)
Glucose-Capillary: 223 mg/dL — ABNORMAL HIGH (ref 70–99)
Glucose-Capillary: 118 mg/dL — ABNORMAL HIGH (ref 70–99)

## 2022-05-02 MED ORDER — MIDODRINE HCL 5 MG PO TABS
5.0000 mg | ORAL_TABLET | Freq: Three times a day (TID) | ORAL | Status: DC
  Administered 2022-05-02 – 2022-05-04 (×7): 5 mg via ORAL
  Filled 2022-05-02 (×7): qty 1

## 2022-05-02 NOTE — NC FL2 (Signed)
[place] LEVEL OF CARE FORM     IDENTIFICATION  Patient Name: Patrick Rocha Birthdate: 26-May-1945 Sex: male Admission Date (Current Location): 04/26/2022  [facility] and [place] Number:  Herbalist and Address:  [facility],  [place], [place]      Provider Number: [id number]  Attending Physician Name and Address:  Duard Brady, MD  Relative Name and Phone Number:  Dian Situ [phone] [facility])    Current Level of Care: Hospital Recommended Level of Care: Assisted Living Facility Prior Approval Number:    Date Approved/Denied:   PASRR Number:    Discharge Plan: Other (Comment) (ALF)    Current Diagnoses: Patient Active Problem List   Diagnosis Date Noted   Syncope and collapse 04/28/2022   Leg edema 04/28/2022   Failure to thrive in adult 04/26/2022   Atrial fibrillation with RVR ([place]) 04/26/2022   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   RSV (respiratory syncytial virus pneumonia) 04/05/2022   Acute renal failure ([place]) 02/28/2022   Paroxysmal atrial fibrillation ([place]) 02/28/2022   Hypothyroidism 02/28/2022   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/2022   Type 2 diabetes mellitus with chronic kidney disease, with long-term current use of insulin ([place]) [id number]   Acute metabolic encephalopathy [id number]   Urinary tract infection without hematuria 02/28/2022   Hypotension 02/27/2022   Dehydration 02/27/2022   Brain mass 01/29/2022   Morbid (severe) obesity due to excess calories ([place]) 01/29/2022   Hypertensive emergency 01/29/2022   Vertigo 01/29/2022   Stage 3b chronic kidney disease (CKD) ([place]) - baseline SCr 1.7-1.9 01/29/2022   COPD (chronic obstructive pulmonary disease) ([place]) 01/16/2018   Left renal mass 03/18/2016   OSA on CPAP 12/10/2015   Benign prostatic hyperplasia 10/20/2010   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   Controlled type 2 diabetes mellitus with stage 3 chronic kidney disease,  without long-term current use of insulin ([place]) 07/20/2010   Hypertension 07/20/2010   Persistent atrial fibrillation ([place]) 07/20/2010   Esophageal reflux 12/06/2006    Orientation RESPIRATION BLADDER Height & Weight     Self, Time, Situation, Place  Normal Continent Weight: 252 lb 13.9 oz (114.7 kg) Height:  5\' 9"  (175.3 cm)  BEHAVIORAL SYMPTOMS/MOOD NEUROLOGICAL BOWEL NUTRITION STATUS      Continent Diet (Heart Healthy)  AMBULATORY STATUS COMMUNICATION OF NEEDS Skin   Limited Assist Verbally Normal                       Personal Care Assistance Level of Assistance  Bathing, Feeding, Dressing Bathing Assistance: Limited assistance Feeding assistance: Independent Dressing Assistance: Limited assistance     Functional Limitations Info  Sight, Hearing, Speech Sight Info: Adequate Hearing Info: Impaired Speech Info: Adequate    SPECIAL CARE FACTORS FREQUENCY                       Contractures Contractures Info: Not present    Additional Factors Info  Code Status, Allergies Code Status Info: DNR Allergies Info: Decadron (Dexamethasone)           Current Medications (05/02/2022):  This is the current hospital active medication list Current Facility-Administered Medications  Medication Dose Route Frequency Provider Last Rate Last Admin   acetaminophen (TYLENOL) tablet 325-650 mg  325-650 mg Oral Q6H PRN Little Ishikawa, MD   650 mg at 04/30/22 2122   atorvastatin (LIPITOR) tablet 40 mg  40 mg Oral Daily Emilee Hero, MD   40 mg at 05/02/22 [phone]  Chlorhexidine Gluconate Cloth 2 % PADS 6 each  6 each Topical QHS Little Ishikawa, MD   6 each at 05/01/22 2147   diltiazem (CARDIZEM SR) 12 hr capsule 120 mg  120 mg Oral Q12H Little Ishikawa, MD   120 mg at 05/02/22 0932   diltiazem (CARDIZEM) 125 mg in dextrose 5% 125 mL (1 mg/mL) infusion  5-15 mg/hr Intravenous Continuous Emilee Hero, MD   Stopped at 04/27/22 1551   escitalopram (LEXAPRO)  tablet 10 mg  10 mg Oral Daily Emilee Hero, MD   10 mg at 05/02/22 0903   heparin injection 5,000 Units  5,000 Units Subcutaneous Q8H Little Ishikawa, MD   5,000 Units at 05/02/22 1349   ibuprofen (ADVIL) tablet 600 mg  600 mg Oral Q4H PRN Little Ishikawa, MD   600 mg at 04/28/22 0641   insulin aspart (novoLOG) injection 0-24 Units  0-24 Units Subcutaneous TID WC Emilee Hero, MD   2 Units at 05/02/22 1229   insulin glargine-yfgn (SEMGLEE) injection 12 Units  12 Units Subcutaneous QHS Emilee Hero, MD   12 Units at 05/01/22 2215   levothyroxine (SYNTHROID) tablet 150 mcg  150 mcg Oral QAC breakfast Emilee Hero, MD   150 mcg at 05/02/22 [id number]   metoprolol tartrate (LOPRESSOR) tablet 25 mg  25 mg Oral BID Emilee Hero, MD   25 mg at 05/02/22 0903   midodrine (PROAMATINE) tablet 5 mg  5 mg Oral TID WC Nahser, Wonda Cheng, MD   5 mg at 05/02/22 1229   Oral care mouth rinse  15 mL Mouth Rinse PRN Dorrell, Herbie [place], MD       pantoprazole (PROTONIX) EC tablet 40 mg  40 mg Oral BID Emilee Hero, MD   40 mg at 05/02/22 [id number]   polyvinyl alcohol (LIQUIFILM TEARS) 1.4 % ophthalmic solution 1 drop  1 drop Both Eyes PRN Little Ishikawa, MD   1 drop at 05/02/22 1526   tamsulosin (FLOMAX) capsule 0.4 mg  0.4 mg Oral Daily Emilee Hero, MD   0.4 mg at 05/02/22 [id number]     Discharge Medications: Please see discharge summary for a list of discharge medications.  Relevant Imaging Results:  Relevant Lab Results:   Additional Information SSN [id number]  Vassie Moselle, LCSW

## 2022-05-02 NOTE — Progress Notes (Addendum)
[facility]) Hospice hospital liaison note  Plan was for patient to be admitted at facility with [facility] hospice services. However, patient was transferred to the ED prior to admission. Referral received from [facility] for hospice services upon discharge back to facility. Liaison will continue to follow for discharge planning needs.     Please call with any questions/concerns.    Thank you for the opportunity to participate in this patient's care.  Jhonnie Garner, BSN, RN [facility] hospital liaison [phone]

## 2022-05-02 NOTE — TOC Progression Note (Addendum)
Transition of Care [facility]) - Progression Note    Patient Details  Name: Patrick Rocha MRN: [id number] Date of Birth: [birth year]  Transition [facility]) CM/SW [place], [place] Phone Number: 05/02/2022, 2:40 PM  Clinical Narrative:    Spoke with pt's DIL via t/c to discuss recommendation for hospice services at ALF. CSW informed pt's DIL that insurance will not cover home health and hospice at the same time. DIL is wanting pt to receiving home health OT/PT. CSW discussed option of pt returning to ALF with palliative care and home health with ability to progress to hospice following home health. Pt's DIL is to discuss with her spouse prior to making decision.   Update 3:05pm- Received call from pt's DIL who shares that family would like to proceed with hospice services at discharge. Contacted [facility] with families decision.    Expected Discharge Plan: Assisted Living Barriers to Discharge: Continued Medical Work up  Expected Discharge Plan and Services In-house Referral: NA Discharge Planning Services: CM Consult Post Acute Care Choice: Resumption of Svcs/PTA Provider Living arrangements for the past 2 months: Assisted Living Facility                 DME Arranged: N/A DME Agency: NA       HH Arranged: NA HH Agency: NA         Social Determinants of Health (SDOH) Interventions SDOH Screenings   Food Insecurity: No Food Insecurity (04/27/2022)  Housing: High Risk (04/27/2022)  Transportation Needs: Unmet Transportation Needs (04/27/2022)  Utilities: Not At Risk (04/27/2022)  Tobacco Use: Medium Risk (04/26/2022)    Readmission Risk Interventions    04/28/2022    2:44 PM  Readmission Risk Prevention Plan  Transportation Screening Complete  PCP or Specialist Appt within 3-5 Days Complete  HRI or [place] Complete  Social Work Consult for [place] Planning/Counseling Complete  Palliative Care Screening Complete  Medication  Review Press photographer) Complete

## 2022-05-02 NOTE — Progress Notes (Signed)
Physical Therapy Treatment Patient Details Name: Patrick Rocha MRN: [id number] DOB: April 30, 1945 Today's Date: 05/02/2022   History of Present Illness Patrick Rocha is a 77 y.o. male with medical history significant of A-fib status post ablation, type 2 diabetes, hypothyroidism, hypertension brain mass status post biopsy who presents emergency department from his assisted living facility.  Patient was discharged from a skilled nursing facility the day of admission.  Upon presenting to assisted living facility patient had multiple episodes of syncope, lightheadedness, unsteadiness and collapsed to the floor. Recently in hospital for similar  episode.    PT Comments    General Comments: AxO x 3 pleasant and cooperative.  OOB in recliner B LE elevated.  TEDS on. Assisted out of recliner and monitor BP.  General transfer comment: pt required Min/Mod Assist to rise from recliner but with instant c/o dizziness upond standing.  BP dropped from 120/64(80) to 84/46(58).  RA 94%.  Pt was unable to attempt amb due to hypotension associated with dizziness.   Pt will need ST Rehab at SNF to address mobility and functional decline prior to safely returning home.   Recommendations for follow up therapy are one component of a multi-disciplinary discharge planning process, led by the attending physician.  Recommendations may be updated based on patient status, additional functional criteria and insurance authorization.  Follow Up Recommendations  Can patient physically be transported by private vehicle: No    Assistance Recommended at Discharge Frequent or constant Supervision/Assistance  Patient can return home with the following A lot of help with walking and/or transfers;A lot of help with bathing/dressing/bathroom;Assistance with cooking/housework;Assist for transportation;Help with stairs or ramp for entrance   Equipment Recommendations  None recommended by PT    Recommendations for  Other Services       Precautions / Restrictions Precautions Precautions: Fall Precaution Comments: monitor BP, reports dizziness, TEDS Restrictions Weight Bearing Restrictions: No     Mobility  Bed Mobility               General bed mobility comments: OOB in recliner    Transfers Overall transfer level: Needs assistance Equipment used: Rolling walker (2 wheels) Transfers: Sit to/from Stand Sit to Stand: Min assist, Mod assist, +2 safety/equipment, +2 physical assistance           General transfer comment: pt required Min/Mod Assist to rise from recliner but with instant c/o dizziness upond standing.  BP dropped from 120/64(80) to 84/46(58).    Ambulation/Gait               General Gait Details: unable to attempt due to MAX c/o dizziness and Orthostatic   Stairs             Wheelchair Mobility    Modified Rankin (Stroke Patients Only)       Balance                                            Cognition Arousal/Alertness: Awake/alert Behavior During Therapy: Flat affect, WFL for tasks assessed/performed Overall Cognitive Status: Within Functional Limits for tasks assessed                                 General Comments: AxO x 3 pleasant and cooperative.  OOB in recliner B LE elevated.  TEDS on.  Exercises      General Comments        Pertinent Vitals/Pain Pain Assessment Pain Assessment: No/denies pain    Home Living                          Prior Function            PT Goals (current goals can now be found in the care plan section) Progress towards PT goals: Progressing toward goals    Frequency    Min 3X/week      PT Plan Current plan remains appropriate    Co-evaluation              AM-PAC PT "6 Clicks" Mobility   Outcome Measure  Help needed turning from your back to your side while in a flat bed without using bedrails?: A Lot Help needed moving from  lying on your back to sitting on the side of a flat bed without using bedrails?: A Lot Help needed moving to and from a bed to a chair (including a wheelchair)?: A Lot Help needed standing up from a chair using your arms (e.g., wheelchair or bedside chair)?: A Lot Help needed to walk in hospital room?: A Lot Help needed climbing 3-5 steps with a railing? : Total 6 Click Score: 11    End of Session Equipment Utilized During Treatment: Gait belt Activity Tolerance: Other (comment) (Orthostatic) Patient left: in chair;with call bell/phone within reach;with chair alarm set Nurse Communication: Mobility status PT Visit Diagnosis: Difficulty in walking, not elsewhere classified (R26.2)     Time: 1100-1117 PT Time Calculation (min) (ACUTE ONLY): 17 min  Charges:  $Therapeutic Activity: 8-22 mins                     {Marcedes Tech  PTA Acute  Rehabilitation Services Office M-F          [phone] Weekend pager [phone]

## 2022-05-02 NOTE — Progress Notes (Signed)
Occupational Therapy Treatment Patient Details Name: Patrick Rocha MRN: [id number] DOB: [birth year] Today's Date: 05/02/2022   History of present illness Patrick Rocha is a 77 y.o. male with medical history significant of A-fib status post ablation, type 2 diabetes, hypothyroidism, hypertension brain mass status post biopsy who presents emergency department from his assisted living facility.  Patient was discharged from a skilled nursing facility the day of admission.  Upon presenting to assisted living facility patient had multiple episodes of syncope, lightheadedness, unsteadiness and collapsed to the floor. Recently in hospital for similar  episode.   OT comments  Patient progressing and showed improved tolerance to EOB grooming, compared to previous session but still quick to fatigue with RPE (Rate of Perceived Exertion) score of 7/10 (moderate to heavy) after about 8 min of EOB ADLs with audible wheezing and SHOB.  BP and HR changes with pt in A-Fib also likely contributing to pt's decreased endurance. Vitals recorded below.   Patient remains limited by continued pain behind his RT eye, body habitus, generalized weakness and decreased activity tolerance along with deficits noted below. Pt continues to demonstrate fair rehab potential and would benefit from continued skilled OT to increase safety and independence with ADLs and functional transfers to allow pt to return home safely and reduce caregiver burden and fall risk.    Recommendations for follow up therapy are one component of a multi-disciplinary discharge planning process, led by the attending physician.  Recommendations may be updated based on patient status, additional functional criteria and insurance authorization.    Assistance Recommended at Discharge Frequent or constant Supervision/Assistance  Patient can return home with the following  A little help with walking and/or transfers;A lot of help with  bathing/dressing/bathroom;Assist for transportation;Assistance with cooking/housework   Equipment Recommendations  None recommended by OT    Recommendations for Other Services      Precautions / Restrictions Precautions Precautions: Fall;Other (comment) Precaution Comments: monitor BP, reports dizziness, TEDS Restrictions Weight Bearing Restrictions: No       Mobility Bed Mobility Overal bed mobility: Needs Assistance Bed Mobility: Sit to Supine     Supine to sit: Supervision, HOB elevated          Transfers                         Balance Overall balance assessment: Needs assistance Sitting-balance support: Feet supported Sitting balance-Leahy Scale: Good     Standing balance support: Reliant on assistive device for balance Standing balance-Leahy Scale: Poor                             ADL either performed or assessed with clinical judgement   ADL Overall ADL's : Needs assistance/impaired     Grooming: Set up;Sitting;Wash/dry face;Oral care Grooming Details (indicate cue type and reason): Pt sat EOB and performed grooming. Noted pt became [facility] with SpO2 staying 92% or higher on RA, but HR as high as 140. Pt asked to take rest break and breath with HR recovery to 70s. Pt rated EOB grooming as 7/10 on RPE scale.                 Toilet Transfer: Min guard;Supervision/safety;Rolling walker (2 wheels);Stand-pivot;Cueing for safety Toilet Transfer Details (indicate cue type and reason): Pt stood from EOB with Min guard assist and used RW to pivot to recliner with close supervision. Cues for hand placement when sitting to control  descent.   Toileting - Clothing Manipulation Details (indicate cue type and reason): Pt on pure wick     Functional mobility during ADLs: Min guard;Rolling walker (2 wheels);Supervision/safety General ADL Comments: BP reads: Supine 131/66, HR: 103.  EOB: 120/54, HR: 104.  EOB after 8 min of grooming: 117/71, HR:  76.  After pivot to recliner: BP: 104/72, HR: 109. After recliner for 3 min post bicep curls and ankle pumps: 126/59.  Cariologist in room and noting BP changes.    Extremity/Trunk Assessment Upper Extremity Assessment Upper Extremity Assessment: Generalized weakness;RUE deficits/detail;LUE deficits/detail RUE Deficits / Details: Forearm in kerlex due to "Weeping" per pt. +edema to hand. LUE Deficits / Details: edema dorsal hand            Vision Baseline Vision/History: 1 Wears glasses Additional Comments: Reports RT eye "pressure" similar to headache. Declined pain meds.   Perception     Praxis      Cognition Arousal/Alertness: Awake/alert Behavior During Therapy: Flat affect, WFL for tasks assessed/performed Overall Cognitive Status: Within Functional Limits for tasks assessed                                          Exercises Other Exercises Other Exercises: Pt cued and performing EOB and recliner level bicep curls, ankle pumps and at EOB: marching in place and LAQs during breaks from BP monitoring to allow for BP recovery.    Shoulder Instructions       General Comments      Pertinent Vitals/ Pain       Pain Assessment Pain Assessment: 0-10 Pain Score: 3  Pain Location: behind R eye Pain Descriptors / Indicators: Aching Pain Intervention(s): Monitored during session, Repositioned  Home Living                                          Prior Functioning/Environment              Frequency  Min 2X/week        Progress Toward Goals  OT Goals(current goals can now be found in the care plan section)  Progress towards OT goals: Progressing toward goals     Plan Discharge plan remains appropriate    Co-evaluation                 AM-PAC OT "6 Clicks" Daily Activity     Outcome Measure   Help from another person eating meals?: None Help from another person taking care of personal grooming?: A Little Help  from another person toileting, which includes using toliet, bedpan, or urinal?: A Little Help from another person bathing (including washing, rinsing, drying)?: A Lot Help from another person to put on and taking off regular upper body clothing?: A Little Help from another person to put on and taking off regular lower body clothing?: A Lot 6 Click Score: 17    End of Session Equipment Utilized During Treatment: Rolling walker (2 wheels)  OT Visit Diagnosis: Muscle weakness (generalized) (M62.81);Dizziness and giddiness (R42)   Activity Tolerance Patient limited by fatigue   Patient Left in chair;with call bell/phone within reach;with chair alarm set (MD in room)   Nurse Communication Other (comment) (MD: BP changes)        [id number] OT Time Calculation (min):  31 min  Charges: OT General Charges $OT Visit: 1 Visit OT Treatments $Self Care/Home Management : 8-22 mins $Therapeutic Activity: 8-22 mins  Anderson Malta, OT Acute Rehab Services Office: [phone] 05/02/2022  Julien Girt 05/02/2022, 10:57 AM

## 2022-05-02 NOTE — Progress Notes (Signed)
Rounding Note    Patient Name: Patrick Rocha Date of Encounter: 05/02/2022  [facility] Cardiologist: Domenic Polite   Subjective   77 yo with PAF, Afib with RVR in association with volume depletion and hypotension, brain mass  Has hyponatremia ( presumable from his head mass ??)  and so he has been put on fluid restriction  He has been orthostatic since being on fluid restrictions   Has just been seen by OT BP has been stable while sitting up  Has not been able to stand , walk due to profound orthostasis Will add midodrine    Inpatient Medications    Scheduled Meds:  atorvastatin  40 mg Oral Daily   Chlorhexidine Gluconate Cloth  6 each Topical QHS   diltiazem  120 mg Oral Q12H   escitalopram  10 mg Oral Daily   heparin  5,000 Units Subcutaneous Q8H   insulin aspart  0-24 Units Subcutaneous TID WC   insulin glargine-yfgn  12 Units Subcutaneous QHS   levothyroxine  150 mcg Oral QAC breakfast   metoprolol tartrate  25 mg Oral BID   pantoprazole  40 mg Oral BID   tamsulosin  0.4 mg Oral Daily   Continuous Infusions:  diltiazem (CARDIZEM) infusion Stopped (04/27/22 1551)   PRN Meds: acetaminophen, ibuprofen, mouth rinse, polyvinyl alcohol   Vital Signs    Vitals:   05/02/22 0507 05/02/22 0903 05/02/22 0904 05/02/22 0937  BP: 120/70 120/85 120/85 (!) 120/59  Pulse: (!) 107 (!) 135 (!) 132 (!) 115  Resp: 18     Temp: (!) 97.5 F (36.4 C)   97.9 F (36.6 C)  TempSrc: Oral   Oral  SpO2: (!) 86%  97%   Weight:      Height:        Intake/Output Summary (Last 24 hours) at 05/02/2022 1032 Last data filed at 05/01/2022 1800 Gross per 24 hour  Intake 100 ml  Output 400 ml  Net -300 ml      04/27/2022    5:00 AM 04/26/2022    9:00 PM 04/26/2022    1:26 PM  Last 3 Weights  Weight (lbs) 252 lb 13.9 oz 259 lb 11.2 oz 264 lb 8.8 oz  Weight (kg) 114.7 kg 117.8 kg 120 kg      Telemetry    Afib with V rate 115. - Personally Reviewed  ECG     -  Personally Reviewed  Physical Exam   GEN: elderly male,  looks somewhat pale ,  in NAD  Neck: No JVD Cardiac: irreg. Irreg.  Respiratory: few rales  GI: Soft, nontender, non-distended  MS: No edema; No deformity. Neuro:  Nonfocal  Psych: Normal affect   Labs    High Sensitivity Troponin:   Recent Labs  Lab 04/26/22 1410 04/26/22 1623  TROPONINIHS 38* 40*     Chemistry Recent Labs  Lab 04/26/22 1410 04/26/22 1623 04/28/22 0252 04/29/22 0557 04/30/22 0542 05/01/22 0500 05/02/22 0455  NA 138  --  135   < > 138 136 136  K 2.7*  --  3.2*   < > 4.1 4.1 4.1  CL 102  --  101   < > 105 104 102  CO2 23  --  27   < > 24 25 25   GLUCOSE 151*  --  254*   < > 107* 170* 183*  BUN 21  --  24*   < > 20 20 20   CREATININE 2.08*   < >  1.83*   < > 1.69* 1.63* 1.73*  CALCIUM 6.5*  --  6.9*   < > 7.7* 7.8* 7.7*  MG 1.1*  --  2.5*  --   --   --   --   PROT 5.5*  --   --   --   --   --   --   ALBUMIN 1.9*  --   --   --   --   --   --   AST 35  --   --   --   --   --   --   ALT 25  --   --   --   --   --   --   ALKPHOS 57  --   --   --   --   --   --   BILITOT 1.3*  --   --   --   --   --   --   GFRNONAA 33*   < > 38*   < > 42* 44* 41*  ANIONGAP 13  --  7   < > 9 7 9    < > = values in this interval not displayed.    Lipids No results for input(s): "CHOL", "TRIG", "HDL", "LABVLDL", "LDLCALC", "CHOLHDL" in the last 168 hours.  Hematology Recent Labs  Lab 04/30/22 0542 05/01/22 0500 05/02/22 0455  WBC 7.7 6.5 6.1  RBC 2.56* 2.41* 2.58*  HGB 8.0* 7.6* 8.1*  HCT 26.0* 24.0* 25.4*  MCV 101.6* 99.6 98.4  MCH 31.3 31.5 31.4  MCHC 30.8 31.7 31.9  RDW 15.9* 16.2* 16.1*  PLT 419* 362 335   Thyroid No results for input(s): "TSH", "FREET4" in the last 168 hours.  BNP Recent Labs  Lab 04/26/22 1410  BNP 232.1*    DDimer No results for input(s): "DDIMER" in the last 168 hours.   Radiology    No results found.  Cardiac Studies      Patient Profile     77 y.o. male     Assessment & Plan     1.  Orthostasis:   BP is still marginal.  Will add Midodrine 5 mg TID .   If he develops hypertension, we could reduce his dose to 2.5 mg TID  2.  Atrial fib:   Rate is bad but is slighly tachycardic .  His heart rate may improve after the addition of midodrine.    [place] will sign off.   Medication Recommendations:  cont meds  Other recommendations (labs, testing, etc)   Follow up as an outpatient:  the plan is for home hospice after discharge.  For questions or updates, please contact [place] Please consult [email or web address] for contact info under        Signed, Mertie Moores, MD  05/02/2022, 10:32 AM

## 2022-05-02 NOTE — Progress Notes (Signed)
MEWS Progress Note  Patient Details Name: Patrick Rocha MRN: [id number] DOB: August 22, 1945 Today's Date: 05/02/2022   MEWS Flowsheet Documentation:  Assess: MEWS Score Temp: 97.9 F (36.6 C) BP: (!) 120/59 MAP (mmHg): 79 Pulse Rate: (!) 115 ECG Heart Rate: 93 Resp: 18 Level of Consciousness: Alert SpO2: 95 % O2 Device: Room Air Patient Activity (if Appropriate):  (bed side commode) O2 Flow Rate (L/min): 2 L/min FiO2 (%): 21 % Assess: MEWS Score MEWS Temp: 0 MEWS Systolic: 0 MEWS Pulse: 2 MEWS RR: 0 MEWS LOC: 0 MEWS Score: 2 MEWS Score Color: Yellow Assess: SIRS CRITERIA SIRS Temperature : 0 SIRS Respirations : 0 SIRS Pulse: 1 SIRS WBC: 0 SIRS Score Sum : 1 SIRS Temperature : 0 SIRS Pulse: 1 SIRS Respirations : 0 SIRS WBC: 0 SIRS Score Sum : 1 Assess: if the MEWS score is Yellow or Red Were vital signs taken at a resting state?: No Focused Assessment: No change from prior assessment Does the patient meet 2 or more of the SIRS criteria?: No Does the patient have a confirmed or suspected source of infection?: No Provider and Rapid Response Notified?: Yes MEWS guidelines implemented : Yes, yellow Treat MEWS Interventions: Considered administering scheduled or prn medications/treatments as ordered Take Vital Signs Increase Vital Sign Frequency : Yellow: Q2hr x1, continue Q4hrs until patient remains green for 12hrs Escalate MEWS: Escalate: Yellow: Discuss with charge nurse and consider notifying provider and/or RRT Notify: Charge Nurse/RN Name of Charge Nurse/RN Notified: Engineer, civil (consulting) Provider Notification Provider Name/Title: Shelly Coss Date Provider Notified: 05/02/22 Time Provider Notified: [id number] Method of Notification: Page Notification Reason: Change in status Provider response: No new orders Date of Provider Response: 05/02/22 Time of Provider Response: [place] 05/02/2022, 10:53 AM

## 2022-05-02 NOTE — Care Management Important Message (Signed)
Important Message  Patient Details IM Letter given. Name: Chuong Vonbank MRN: [id number] Date of Birth: [birth year]   Medicare Important Message Given:  Yes     Kerin Salen 05/02/2022, 11:25 AM

## 2022-05-02 NOTE — Progress Notes (Signed)
PROGRESS NOTE    Patrick Rocha  [id number] DOB: 1945-05-29 DOA: 04/26/2022 PCP: Elliot Dally, MD   Brief Narrative:  This 77 y.o. male with PMH  significant of A-fib status post ablation, type 2 diabetes, hypothyroidism, hypertension,  brain mass status post biopsy who presents to the ED from his assisted living facility.  Patient was discharged from a skilled nursing facility on the day of admission.  Upon presenting to assisted living facility patient had multiple episodes of syncope, lightheadedness, unsteadiness and collapsed to the floor.  Patient had similar episode on 04/05/2022 secondary to hypotension and RSV infection.  Patient again was found to be hypotensive at intake, patient and family indicate poor p.o. intake over the past few days to week.  Patient admitted for further evaluation and workup.   Patient slowly improving over the past 72 hours, his initial syncope versus presyncope with orthostatics has resolved, able to ambulate yesterday with minimal to no change in orthostatic vital signs or symptoms.  Will continue to ambulate patient with physical therapy for further discussion on disposition given his recent SNF admission having just been discharged to assisted living for 24 hours prior to readmission to the hospital.  Assessment & Plan:   Principal Problem:   Failure to thrive in adult Active Problems:   Atrial fibrillation with RVR (HCC)   Syncope and collapse   Leg edema  Acute on recurrent episodes of syncope, likely orthostatic : -Patient continues to have episodes of orthostatic hypotension which are moderately symptomatic with dizziness and loss of balance. He was not syncopal or presyncopal today however. -Echo previously performed was without evidence of heart failure -PT OT ongoing current recommendations for home health PT. -Appreciate cardiology's help managing BP, Afib, and orthostatic hypotension. -Bilateral upper extremity edema -likely  venous stasis, holding Lasix given above   A-fib with RVR, Asymptomatic, ongoing, rate improved -HR well controlled , Continue metoprolol, diltiazem -Consider amiodarone per cardiology if rate control continues to be an issue. -Xarelto currently on hold, discussed with cardiology, I do feel that patient's risk of bleeding in the setting of brain mass and fall risk vastly outweigh his risk of acute stroke in the setting of A-fib.  Certainly this conversation can be held with patient's primary care team outpatient however at this time especially during his critical illness we will discontinue the Xarelto.   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 Hemoccult stool test negative, No signs or symptoms of bleeding -Concern this may be advancing chronic anemia of chronic disease given patient's multiple comorbidities including CKD 3B. -Iron panel: low TIBC but elevated saturation, iron within normal limits -Hold off transfusion at this time unless hemoglobin less than 7, he becomes symptomatic, or overt bleeding is discovered.   Polypharmacy, high risk -Patient has had multiple hospitalizations , transition to SNF and back to assisted living with apparently multiple episodes of medication changes so much so now that the patient and family are worried patient is receiving the wrong medications at facility given his medication list presented here is different than his prior. Patient is no longer taking benazepril, he has not been given his Synthroid in 2 weeks despite it being active on his previous med list. - There is concern multiple of patient's medications are being inappropriately held or administered.  Will go over med list  with patient and family very closely again at discharge.  Recommend no other medication changes  after discharge until he follows up with his PCP who is well aware of the situation.   Brain mass, recently discovered  -Noted during December 2023 hospitalization.  Patient on steroids at the time, no indication for procedure at that time. -Unclear if patient's mass with 3 mm midline shift is associated with episodes of syncope as above although given orthostatics/hypotension this is unlikely. -Holding xarelto in the setting of fall risk as below; brain lesion also concerning for increased bleeding potential   Hypothyroidism -Continue Synthroid patient and family report he has not had his Synthroid in over 2 weeks -likely exacerbating above anemia/orthostatic symptoms.   Hypokalemia -Resolved. 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   Hypomagnesemia- -replaced,Continue to monitor. Hyperlipidemia-continue Lipitor Depression-continue escitalopram GERD-continue Protonix BPH-continue Flomax   Type 2 diabetes, uncontrolled with hyperglycemia: -A1c 6.3, continue sliding scale insulin/hypoglycemic protocol   Goals of care -Palliative care consulted - appreciate insight and recommendations -Lengthy discussion today with family over the phone about patient's prognosis.  They understand that given patient's age and multiple comorbidities his risk of morbidity mortality are elevated however previous discussion over hospice is likely somewhat aggressive, would recommend palliative follow-up and further discussion on goals of care as patient remains full code but has discussed with family previously that there were some heroic measures he would not want to undergo, unclear what these were. -Family and patient working with palliative care to further discuss future goals of care.    DVT prophylaxis: SCDs Code Status: DNR Family Communication: No family at bed side.  Disposition Plan:   Status is: Inpatient Remains inpatient appropriate because: Orthostatic hypotension now requiring midodrine TID.    Consultants:  Cardiology  Procedures:None  Antimicrobials: None  Subjective: Patient was seen and examined at bed side.  No overnight events noted. Patient blood pressure still remains low,  denies any dizziness or palpitations.  Objective: Vitals:   05/02/22 0904 05/02/22 0937 05/02/22 1048 05/02/22 1100  BP: 120/85 (!) 120/59  117/70  Pulse: (!) 132 (!) 115  (!) 109  Resp:    20  Temp:  97.9 F (36.6 C)  98 F (36.7 C)  TempSrc:  Oral  Oral  SpO2: 97%  95% 98%  Weight:      Height:        Intake/Output Summary (Last 24 hours) at 05/02/2022 1423 Last data filed at 05/01/2022 1800 Gross per 24 hour  Intake --  Output 400 ml  Net -400 ml   Filed Weights   04/26/22 1326 04/26/22 2100 04/27/22 0500  Weight: 120 kg 117.8 kg 114.7 kg    Examination:  General exam: Appears calm and comfortable, deconditioned, NAD Respiratory system: Clear to auscultation. Respiratory effort normal.  RR 15 Cardiovascular system: S1 & S2 heard, Irregular rhythm, no murmur. Gastrointestinal system: Abdomen is soft, nontender, nondistended, BS+ Central nervous system: Alert and oriented x 3. No focal neurological deficits. Extremities:  No edema, no cyanosis, no clubbing Skin: No rashes, lesions or ulcers Psychiatry: Judgement and insight appear normal. Mood & affect appropriate.     Data Reviewed: I have personally reviewed following labs and imaging studies  CBC: Recent Labs  Lab 04/26/22 1410 04/26/22 1623 04/28/22 0252 04/29/22 0845 04/30/22 0542 05/01/22 0500 05/02/22 0455  WBC 7.8   < > 7.3 7.9 7.7 6.5 6.1  NEUTROABS 6.0  --   --   --   --   --   --  HGB 8.7*   < > 7.6* 8.8* 8.0* 7.6* 8.1*  HCT 27.7*   < > 23.4* 26.9* 26.0* 24.0* 25.4*  MCV 99.6   < > 99.2 97.5 101.6* 99.6 98.4  PLT 415*   < > 405* 473* 419* 362 335   < > = values in this interval not displayed.   Basic Metabolic Panel: Recent Labs  Lab 04/26/22 1410 04/26/22 1623 04/28/22 0252 04/29/22 0557  04/30/22 0542 05/01/22 0500 05/02/22 0455  NA 138  --  135 136 138 136 136  K 2.7*  --  3.2* 3.4* 4.1 4.1 4.1  CL 102  --  101 103 105 104 102  CO2 23  --  27 22 24 25 25   GLUCOSE 151*  --  254* 150* 107* 170* 183*  BUN 21  --  24* 20 20 20 20   CREATININE 2.08*   < > 1.83* 1.75* 1.69* 1.63* 1.73*  CALCIUM 6.5*  --  6.9* 7.2* 7.7* 7.8* 7.7*  MG 1.1*  --  2.5*  --   --   --   --   PHOS 2.9  --   --   --   --   --   --    < > = values in this interval not displayed.   GFR: Estimated Creatinine Clearance: 46.1 mL/min (A) (by C-G formula based on SCr of 1.73 mg/dL (H)). Liver Function Tests: Recent Labs  Lab 04/26/22 1410  AST 35  ALT 25  ALKPHOS 57  BILITOT 1.3*  PROT 5.5*  ALBUMIN 1.9*   No results for input(s): "LIPASE", "AMYLASE" in the last 168 hours. No results for input(s): "AMMONIA" in the last 168 hours. Coagulation Profile: Recent Labs  Lab 04/26/22 1410  INR 1.2   Cardiac Enzymes: No results for input(s): "CKTOTAL", "CKMB", "CKMBINDEX", "TROPONINI" in the last 168 hours. BNP (last 3 results) No results for input(s): "PROBNP" in the last 8760 hours. HbA1C: No results for input(s): "HGBA1C" in the last 72 hours. CBG: Recent Labs  Lab 05/01/22 1303 05/01/22 1619 05/01/22 2203 05/02/22 0752 05/02/22 1219  GLUCAP 162* 129* 258* 165* 136*   Lipid Profile: No results for input(s): "CHOL", "HDL", "LDLCALC", "TRIG", "CHOLHDL", "LDLDIRECT" in the last 72 hours. Thyroid Function Tests: No results for input(s): "TSH", "T4TOTAL", "FREET4", "T3FREE", "THYROIDAB" in the last 72 hours. Anemia Panel: Recent Labs    04/30/22 0542  FERRITIN 363*  TIBC 155*  IRON 63   Sepsis Labs: Recent Labs  Lab 04/26/22 1410 04/26/22 1623  LATICACIDVEN 1.9 1.6    Recent Results (from the past 240 hour(s))  MRSA Next Gen by PCR, Nasal     Status: None   Collection Time: 04/26/22  6:20 PM   Specimen: Nasal Mucosa; Nasal Swab  Result Value Ref Range Status   MRSA by PCR  Next Gen NOT DETECTED NOT DETECTED Final    Comment: (NOTE) The GeneXpert MRSA Assay (FDA approved for NASAL specimens only), is one component of a comprehensive MRSA colonization surveillance program. It is not intended to diagnose MRSA infection nor to guide or monitor treatment for MRSA infections. Test performance is not FDA approved in patients less than 6 years old. Performed at [facility], [place] [address] [place], [place] [address]     Radiology Studies: No results found.  Scheduled Meds:  atorvastatin  40 mg Oral Daily   Chlorhexidine Gluconate Cloth  6 each Topical QHS   diltiazem  120 mg Oral Q12H  escitalopram  10 mg Oral Daily   heparin  5,000 Units Subcutaneous Q8H   insulin aspart  0-24 Units Subcutaneous TID WC   insulin glargine-yfgn  12 Units Subcutaneous QHS   levothyroxine  150 mcg Oral QAC breakfast   metoprolol tartrate  25 mg Oral BID   midodrine  5 mg Oral TID WC   pantoprazole  40 mg Oral BID   tamsulosin  0.4 mg Oral Daily   Continuous Infusions:  diltiazem (CARDIZEM) infusion Stopped (04/27/22 1551)     LOS: 6 days    Time spent: 50 mins    Duard Brady, MD [facility]   If 7PM-7AM, please contact night-coverage

## 2022-05-03 DIAGNOSIS — R627 Adult failure to thrive: Secondary | ICD-10-CM | POA: Diagnosis not present

## 2022-05-03 LAB — CBC
HCT: 26.9 % — ABNORMAL LOW (ref 39.0–52.0)
Hemoglobin: 8.3 g/dL — ABNORMAL LOW (ref 13.0–17.0)
MCH: 31.3 pg (ref 26.0–34.0)
MCHC: 30.9 g/dL (ref 30.0–36.0)
MCV: 101.5 fL — ABNORMAL HIGH (ref 80.0–100.0)
Platelets: 312 10*3/uL (ref 150–400)
RBC: 2.65 MIL/uL — ABNORMAL LOW (ref 4.22–5.81)
RDW: 15.9 % — ABNORMAL HIGH (ref 11.5–15.5)
WBC: 6.4 10*3/uL (ref 4.0–10.5)
nRBC: 0 % (ref 0.0–0.2)

## 2022-05-03 LAB — GLUCOSE, CAPILLARY
Glucose-Capillary: 101 mg/dL — ABNORMAL HIGH (ref 70–99)
Glucose-Capillary: 113 mg/dL — ABNORMAL HIGH (ref 70–99)
Glucose-Capillary: 128 mg/dL — ABNORMAL HIGH (ref 70–99)
Glucose-Capillary: 217 mg/dL — ABNORMAL HIGH (ref 70–99)
Glucose-Capillary: 97 mg/dL (ref 70–99)

## 2022-05-03 LAB — BASIC METABOLIC PANEL
Anion gap: 10 (ref 5–15)
BUN: 18 mg/dL (ref 8–23)
CO2: 23 mmol/L (ref 22–32)
Calcium: 8 mg/dL — ABNORMAL LOW (ref 8.9–10.3)
Chloride: 105 mmol/L (ref 98–111)
Creatinine, Ser: 1.71 mg/dL — ABNORMAL HIGH (ref 0.61–1.24)
GFR, Estimated: 41 mL/min — ABNORMAL LOW (ref >60)
Glucose, Bld: 153 mg/dL — ABNORMAL HIGH (ref 70–99)
Potassium: 4.6 mmol/L (ref 3.5–5.1)
Sodium: 138 mmol/L (ref 135–145)

## 2022-05-03 MED ORDER — NAPHAZOLINE-GLYCERIN 0.012-0.25 % OP SOLN
1.0000 [drp] | Freq: Four times a day (QID) | OPHTHALMIC | Status: DC | PRN
  Administered 2022-05-03: 2 [drp] via OPHTHALMIC
  Filled 2022-05-03: qty 15

## 2022-05-03 MED ORDER — ARTIFICIAL TEARS OPHTHALMIC OINT
TOPICAL_OINTMENT | Freq: Once | OPHTHALMIC | Status: AC
  Administered 2022-05-03: 1 via OPHTHALMIC
  Filled 2022-05-03: qty 3.5

## 2022-05-03 NOTE — Progress Notes (Signed)
  [id number]) Hospice hospital liaison note   Plan was for patient to be admitted at facility with [facility] hospice services. However, patient was transferred to the ED prior to admission. Referral received from [facility] for hospice services upon discharge back to facility. Liaison will continue to follow for discharge planning needs.     Please call with any questions/concerns.    Thank you for the opportunity to participate in this patient's care.   Jhonnie Garner, BSN, RN [facility] hospital liaison [phone]

## 2022-05-03 NOTE — Progress Notes (Signed)
PROGRESS NOTE    Patrick Rocha  [id number] DOB: May 08, 1945 DOA: 04/26/2022 PCP: Elliot Dally, MD   Brief Narrative:  This 77 y.o. male with PMH  significant of A-fib status post ablation, type 2 diabetes, hypothyroidism, hypertension,  brain mass status post biopsy who presents to the ED from his assisted living facility.  Patient was discharged from a skilled nursing facility on the day of admission.  Upon presenting to assisted living facility patient had multiple episodes of syncope, lightheadedness, unsteadiness and collapsed to the floor.  Patient had similar episode on 04/05/2022 secondary to hypotension and RSV infection.  Patient again was found to be hypotensive at intake, patient and family indicate poor p.o. intake over the past few days to week.  Patient admitted for further evaluation and workup.   Patient slowly improving over the past 72 hours, his initial syncope versus presyncope with orthostatics has resolved, able to ambulate yesterday with minimal to no change in orthostatic vital signs or symptoms.  Will continue to ambulate patient with physical therapy for further discussion on disposition given his recent SNF admission having just been discharged to assisted living for 24 hours prior to readmission to the hospital.  Assessment & Plan:   Principal Problem:   Failure to thrive in adult Active Problems:   Atrial fibrillation with RVR (HCC)   Syncope and collapse   Leg edema  Acute on recurrent episodes of syncope, likely orthostatic : -Patient continues to have episodes of orthostatic hypotension which are moderately symptomatic with dizziness and loss of balance. He was not syncopal or presyncopal today however. -Echo previously performed was without evidence of heart failure -PT OT ongoing current recommendations for home health PT. -Appreciate cardiology's help managing BP, Afib, and orthostatic hypotension. -Bilateral upper extremity edema -likely  venous stasis, holding Lasix given above   A-fib with RVR, Asymptomatic, ongoing, rate improved -HR well controlled , Continue metoprolol, diltiazem -Consider amiodarone per cardiology if rate control continues to be an issue. -Xarelto currently on hold, discussed with cardiology, I do feel that patient's risk of bleeding in the setting of brain mass and fall risk vastly outweigh his risk of acute stroke in the setting of A-fib.  Certainly this conversation can be held with patient's primary care team outpatient however at this time especially during his critical illness we will discontinue the Xarelto.   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 Hemoccult stool test negative, No signs or symptoms of bleeding -Concern this may be advancing chronic anemia of chronic disease given patient's multiple comorbidities including CKD 3B. -Iron panel: low TIBC but elevated saturation, iron within normal limits -Hold off transfusion at this time unless hemoglobin less than 7, he becomes symptomatic, or overt bleeding is discovered.   Polypharmacy, high risk -Patient has had multiple hospitalizations , transition to SNF and back to assisted living with apparently multiple episodes of medication changes so much so now that the patient and family are worried patient is receiving the wrong medications at facility given his medication list presented here is different than his prior. Patient is no longer taking benazepril, he has not been given his Synthroid in 2 weeks despite it being active on his previous med list. - There is concern multiple of patient's medications are being inappropriately held or administered.  Will go over med list  with patient and family very closely again at discharge.  Recommend no other medication changes  after discharge until he follows up with his PCP who is well aware of the situation.   Brain mass, recently discovered  -Noted during December 2023 hospitalization.  Patient on steroids at the time, no indication for procedure at that time. -Unclear if patient's mass with 3 mm midline shift is associated with episodes of syncope as above although given orthostatics/hypotension this is unlikely. -Holding xarelto in the setting of fall risk as below; brain lesion also concerning for increased bleeding potential   Hypothyroidism -Continue Synthroid patient and family report he has not had his Synthroid in over 2 weeks -likely exacerbating above anemia/orthostatic symptoms.   Hypokalemia -Resolved. 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   Hypomagnesemia- -replaced,Continue to monitor. Hyperlipidemia-continue Lipitor Depression-continue escitalopram GERD-continue Protonix BPH-continue Flomax   Type 2 diabetes, uncontrolled with hyperglycemia: -A1c 6.3, continue sliding scale insulin/hypoglycemic protocol   Goals of care -Palliative care consulted - appreciate insight and recommendations -Lengthy discussion today with family over the phone about patient's prognosis.  They understand that given patient's age and multiple comorbidities his risk of morbidity mortality are elevated however previous discussion over hospice is likely somewhat aggressive, would recommend palliative follow-up and further discussion on goals of care as patient remains full code but has discussed with family previously that there were some heroic measures he would not want to undergo, unclear what these were. -Family and patient working with palliative care to further discuss future goals of care.    DVT prophylaxis: SCDs Code Status: DNR Family Communication: No family at bed side.  Disposition Plan:   Status is: Inpatient Remains inpatient appropriate because: Orthostatic hypotension now requiring midodrine TID.    Consultants:  Cardiology  Procedures:None  Antimicrobials: None  Subjective: Patient was seen and examined at bed side.  No overnight events noted. Patient's blood pressure has improved. He denies any dizziness or palpitations.  Objective: Vitals:   05/02/22 2022 05/02/22 2324 05/03/22 0327 05/03/22 0824  BP:  130/73 122/72 122/72  Pulse: 94 (!) 107 97   Resp:  17 18   Temp:  97.7 F (36.5 C) 97.7 F (36.5 C)   TempSrc:  Oral Oral   SpO2: 91% 91% 91%   Weight:      Height:        Intake/Output Summary (Last 24 hours) at 05/03/2022 1311 Last data filed at 05/03/2022 1100 Gross per 24 hour  Intake 300 ml  Output 450 ml  Net -150 ml   Filed Weights   04/26/22 1326 04/26/22 2100 04/27/22 0500  Weight: 120 kg 117.8 kg 114.7 kg    Examination:  General exam: Appears comfortable, deconditioned, not in any distress. Respiratory system: CTA bilaterally , Respiratory effort normal.  RR 15 Cardiovascular system: S1 & S2 heard, Irregular rhythm, no murmur. Gastrointestinal system: Abdomen is soft, nontender, nondistended, BS+ Central nervous system: Alert and oriented x 3. No focal neurological deficits. Extremities:  No edema, no cyanosis, no clubbing Skin: No rashes, lesions or ulcers Psychiatry: Judgement and insight appear normal. Mood & affect appropriate.     Data Reviewed: I have personally reviewed following labs and imaging studies  CBC: Recent Labs  Lab 04/26/22 1410 04/26/22 1623 04/29/22 0845 04/30/22 0542 05/01/22 0500 05/02/22 0455 05/03/22 0548  WBC 7.8   < > 7.9 7.7 6.5 6.1 6.4  NEUTROABS 6.0  --   --   --   --   --   --  HGB 8.7*   < > 8.8* 8.0* 7.6* 8.1* 8.3*  HCT 27.7*   < > 26.9* 26.0* 24.0* 25.4* 26.9*  MCV 99.6   < > 97.5 101.6* 99.6 98.4 101.5*  PLT 415*   < > 473* 419* 362 335 312   < > = values in this interval not displayed.   Basic Metabolic Panel: Recent Labs  Lab 04/26/22 1410 04/26/22 1623 04/28/22 0252 04/29/22 0557  04/30/22 0542 05/01/22 0500 05/02/22 0455 05/03/22 0548  NA 138  --  135 136 138 136 136 138  K 2.7*  --  3.2* 3.4* 4.1 4.1 4.1 4.6  CL 102  --  101 103 105 104 102 105  CO2 23  --  27 22 24 25 25 23   GLUCOSE 151*  --  254* 150* 107* 170* 183* 153*  BUN 21  --  24* 20 20 20 20 18   CREATININE 2.08*   < > 1.83* 1.75* 1.69* 1.63* 1.73* 1.71*  CALCIUM 6.5*  --  6.9* 7.2* 7.7* 7.8* 7.7* 8.0*  MG 1.1*  --  2.5*  --   --   --   --   --   PHOS 2.9  --   --   --   --   --   --   --    < > = values in this interval not displayed.   GFR: Estimated Creatinine Clearance: 46.6 mL/min (A) (by C-G formula based on SCr of 1.71 mg/dL (H)). Liver Function Tests: Recent Labs  Lab 04/26/22 1410  AST 35  ALT 25  ALKPHOS 57  BILITOT 1.3*  PROT 5.5*  ALBUMIN 1.9*   No results for input(s): "LIPASE", "AMYLASE" in the last 168 hours. No results for input(s): "AMMONIA" in the last 168 hours. Coagulation Profile: Recent Labs  Lab 04/26/22 1410  INR 1.2   Cardiac Enzymes: No results for input(s): "CKTOTAL", "CKMB", "CKMBINDEX", "TROPONINI" in the last 168 hours. BNP (last 3 results) No results for input(s): "PROBNP" in the last 8760 hours. HbA1C: No results for input(s): "HGBA1C" in the last 72 hours. CBG: Recent Labs  Lab 05/02/22 1720 05/02/22 2149 05/03/22 0005 05/03/22 0749 05/03/22 1157  GLUCAP 223* 118* 113* 128* 97   Lipid Profile: No results for input(s): "CHOL", "HDL", "LDLCALC", "TRIG", "CHOLHDL", "LDLDIRECT" in the last 72 hours. Thyroid Function Tests: No results for input(s): "TSH", "T4TOTAL", "FREET4", "T3FREE", "THYROIDAB" in the last 72 hours. Anemia Panel: No results for input(s): "VITAMINB12", "FOLATE", "FERRITIN", "TIBC", "IRON", "RETICCTPCT" in the last 72 hours.  Sepsis Labs: Recent Labs  Lab 04/26/22 1410 04/26/22 1623  LATICACIDVEN 1.9 1.6    Recent Results (from the past 240 hour(s))  MRSA Next Gen by PCR, Nasal     Status: None   Collection Time:  04/26/22  6:20 PM   Specimen: Nasal Mucosa; Nasal Swab  Result Value Ref Range Status   MRSA by PCR Next Gen NOT DETECTED NOT DETECTED Final    Comment: (NOTE) The GeneXpert MRSA Assay (FDA approved for NASAL specimens only), is one component of a comprehensive MRSA colonization surveillance program. It is not intended to diagnose MRSA infection nor to guide or monitor treatment for MRSA infections. Test performance is not FDA approved in patients less than 49 years old. Performed at [facility], [place] [address], [place], [place] [address]     Radiology Studies: No results found.  Scheduled Meds:  atorvastatin  40 mg Oral Daily   Chlorhexidine Gluconate Cloth  6 each Topical QHS   diltiazem  120 mg Oral Q12H   escitalopram  10 mg Oral Daily   heparin  5,000 Units Subcutaneous Q8H   insulin aspart  0-24 Units Subcutaneous TID WC   insulin glargine-yfgn  12 Units Subcutaneous QHS   levothyroxine  150 mcg Oral QAC breakfast   metoprolol tartrate  25 mg Oral BID   midodrine  5 mg Oral TID WC   pantoprazole  40 mg Oral BID   tamsulosin  0.4 mg Oral Daily   Continuous Infusions:  diltiazem (CARDIZEM) infusion Stopped (04/27/22 1551)     LOS: 7 days    Time spent: 35 mins    Duard Brady, MD [facility]   If 7PM-7AM, please contact night-coverage

## 2022-05-04 DIAGNOSIS — R627 Adult failure to thrive: Secondary | ICD-10-CM | POA: Diagnosis not present

## 2022-05-04 LAB — CBC
HCT: 27.1 % — ABNORMAL LOW (ref 39.0–52.0)
Hemoglobin: 8.4 g/dL — ABNORMAL LOW (ref 13.0–17.0)
MCH: 31.5 pg (ref 26.0–34.0)
MCHC: 31 g/dL (ref 30.0–36.0)
MCV: 101.5 fL — ABNORMAL HIGH (ref 80.0–100.0)
Platelets: 322 10*3/uL (ref 150–400)
RBC: 2.67 MIL/uL — ABNORMAL LOW (ref 4.22–5.81)
RDW: 16.4 % — ABNORMAL HIGH (ref 11.5–15.5)
WBC: 5.6 10*3/uL (ref 4.0–10.5)
nRBC: 0.4 % — ABNORMAL HIGH (ref 0.0–0.2)

## 2022-05-04 LAB — GLUCOSE, CAPILLARY
Glucose-Capillary: 109 mg/dL — ABNORMAL HIGH (ref 70–99)
Glucose-Capillary: 129 mg/dL — ABNORMAL HIGH (ref 70–99)

## 2022-05-04 LAB — BASIC METABOLIC PANEL
Anion gap: 8 (ref 5–15)
BUN: 16 mg/dL (ref 8–23)
CO2: 25 mmol/L (ref 22–32)
Calcium: 8 mg/dL — ABNORMAL LOW (ref 8.9–10.3)
Chloride: 104 mmol/L (ref 98–111)
Creatinine, Ser: 1.69 mg/dL — ABNORMAL HIGH (ref 0.61–1.24)
GFR, Estimated: 42 mL/min — ABNORMAL LOW (ref >60)
Glucose, Bld: 103 mg/dL — ABNORMAL HIGH (ref 70–99)
Potassium: 3.8 mmol/L (ref 3.5–5.1)
Sodium: 137 mmol/L (ref 135–145)

## 2022-05-04 MED ORDER — METOPROLOL TARTRATE 25 MG PO TABS: 25.0000 mg | ORAL_TABLET | Freq: Two times a day (BID) | ORAL | 1 refills | Status: DC

## 2022-05-04 MED ORDER — MIDODRINE HCL 5 MG PO TABS: 5.0000 mg | ORAL_TABLET | Freq: Three times a day (TID) | ORAL | 1 refills | Status: DC

## 2022-05-04 MED ORDER — DILTIAZEM HCL ER 120 MG PO CP12: 120.0000 mg | ORAL_CAPSULE | Freq: Two times a day (BID) | ORAL | 1 refills | Status: DC

## 2022-05-04 MED ORDER — SEMGLEE 100 UNIT/ML ~~LOC~~ SOPN: 12.0000 [IU] | PEN_INJECTOR | Freq: Every evening | SUBCUTANEOUS | 11 refills | Status: DC

## 2022-05-04 NOTE — Progress Notes (Signed)
Telephone call to [facility]. Report given to nurse Olena.

## 2022-05-04 NOTE — Discharge Instructions (Signed)
Advised to follow-up with primary care physician in 1 week. Advised to take Cardizem 120 mg daily, metoprolol 25 mg twice daily for atrial fibrillation. Advised to continue midodrine 5 mg 3 times daily for hypotension. Xarelto has been discontinued, It  has to be discussed in detail with the primary care physician given long-term consequences.

## 2022-05-04 NOTE — Discharge Summary (Signed)
Physician Discharge Summary  [name] [id number] DOB: Dec 17, 1945 DOA: 04/26/2022  PCP: Elliot Dally, MD  Admit date: 04/26/2022  Discharge date: 05/04/2022  Admitted From:  ALF  Disposition: [facility]  Recommendations for Outpatient Follow-up:  Follow up with PCP in 1-2 weeks. Please obtain BMP/CBC in one week. Advised to take Cardizem 120 mg daily, metoprolol 25 mg twice daily for atrial fibrillation. Advised to continue midodrine 5 mg 3 times daily for hypotension. Xarelto has been discontinued, It  has to be discussed in detail with the primary care physician given long-term consequences.  Home Health: None Equipment/Devices:None  Discharge Condition: Stable CODE STATUS: DNR Diet recommendation: Heart Healthy   Brief Summary/ Hospital Course: This 77 y.o. male with PMH  significant of A-fib status post ablation, type 2 diabetes, hypothyroidism, hypertension,  brain mass status post biopsy who presents to the ED from his assisted living facility.  Patient was discharged from a skilled nursing facility on the day of admission.  Upon presenting to assisted living facility patient had multiple episodes of syncope, lightheadedness, unsteadiness and collapsed to the floor.  Patient had similar episode on 04/05/2022 secondary to hypotension and RSV infection.  Patient again was found to be hypotensive at intake, patient and family indicate poor p.o. intake over the past few days to week.  Patient admitted for further evaluation and workup. Patient has slowly improved over the past 72 hours, his initial syncope versus presyncope with orthostatics has resolved, able to ambulate yesterday with minimal to no change in orthostatic vital signs or symptoms.  Cardiology was consulted.  Patient is started on midodrine 5 mg 3 times daily for hypotension.  Patient continued on Cardizem and metoprolol for atrial fibrillation with RVR. Xarelto was discontinued given high  risk for bleeding.  Resumption of anticoagulation would be discussed by primary care physician in detail given long-term consequences.  Patient feels better and patient wants to be discharged.  Patient is being discharged to assisted living facility with [facility] care will follow for palliative care.  Discharge Diagnoses:  Principal Problem:   Failure to thrive in adult Active Problems:   Atrial fibrillation with RVR (HCC)   Syncope and collapse   Leg edema  Acute on recurrent episodes of syncope, likely orthostatic : -Patient continues to have episodes of orthostatic hypotension which are moderately symptomatic with dizziness and loss of balance.  -He was not syncopal or presyncopal in last few days. -Echo previously performed was without evidence of heart failure -PT OT ongoing current recommendations for home health PT. -Appreciate cardiology's help managing BP, Afib, and orthostatic hypotension. -Bilateral upper extremity edema -likely venous stasis, holding Lasix given above   A-fib with RVR, Asymptomatic, ongoing, rate improved -HR well controlled , Continue metoprolol, diltiazem -Consider amiodarone per cardiology if rate control continues to be an issue. -Xarelto currently on hold, discussed with cardiology, I do feel that patient's risk of bleeding in the setting of brain mass and fall risk vastly outweigh his risk of acute stroke in the setting of A-fib.  Certainly this conversation can be held with patient's primary care team outpatient however at this time especially during his critical illness we will discontinue the Xarelto.   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 Hemoccult stool test negative, No signs or symptoms of bleeding -Concern this may be  advancing chronic anemia of chronic disease  given patient's multiple comorbidities including CKD 3B. -Iron panel: low TIBC but elevated saturation, iron within normal limits -Hold off transfusion at this time unless hemoglobin less than 7, he becomes symptomatic, or overt bleeding is discovered. -H/H remains stable.    Polypharmacy, high risk -Patient has had multiple hospitalizations , transition to SNF and back to assisted living with apparently multiple episodes of medication changes so much so now that the patient and family are worried patient is receiving the wrong medications at facility given his medication list presented here is different than his prior. Patient is no longer taking benazepril, he has not been given his Synthroid in 2 weeks despite it being active on his previous med list. - There is concern multiple of patient's medications are being inappropriately held or administered.   Recommend no other medication changes after discharge until he follows up with his PCP who is well aware of the situation. -Medication reviewed in detail with the daughter-in-law.  All questions answered.   Brain mass, recently discovered  -Noted during December 2023 hospitalization.  Patient on steroids at the time, no indication for procedure at that time. -Unclear if patient's mass with 3 mm midline shift is associated with episodes of syncope as above although given orthostatics/hypotension this is unlikely. -Holding xarelto in the setting of fall risk as below; brain lesion also concerning for increased bleeding potential   Hypothyroidism -Continue Synthroid patient and family report he has not had his Synthroid in over 2 weeks -likely exacerbating above anemia/orthostatic symptoms.   Hypokalemia -Resolved. 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   Hypomagnesemia- -replaced,Continue to monitor. Hyperlipidemia-continue Lipitor Depression-continue escitalopram GERD-continue Protonix BPH-continue Flomax   Type 2 diabetes, uncontrolled with  hyperglycemia: -A1c 6.3, continue sliding scale insulin/hypoglycemic protocol   Goals of care -Palliative care consulted - appreciate insight and recommendations -Lengthy discussion today with family over the phone about patient's prognosis.  They understand that given patient's age and multiple comorbidities his risk of morbidity mortality are elevated however previous discussion over hospice is likely somewhat aggressive, would recommend palliative follow-up and further discussion on goals of care as patient remains full code but has discussed with family previously that there were some heroic measures he would not want to undergo, unclear what these were. -Family and patient working with palliative care to further discuss future goals of care.    Discharge Instructions  Discharge Instructions     Call MD for:  difficulty breathing, headache or visual disturbances   Complete by: As directed    Call MD for:  persistant dizziness or light-headedness   Complete by: As directed    Call MD for:  persistant nausea and vomiting   Complete by: As directed    Diet - low sodium heart healthy   Complete by: As directed    Diet Carb Modified   Complete by: As directed    Discharge instructions   Complete by: As directed    Advised to follow-up with primary care physician in 1 week. Advised to take Cardizem 120 mg daily, metoprolol 25 mg twice daily for atrial fibrillation. Advised to continue midodrine 5 mg 3 times daily for hypotension. Xarelto has been discontinued, It  has to be discussed in detail with the primary care physician given long-term consequences.   Increase activity slowly   Complete by: As directed       Allergies as of 05/04/2022       Reactions   Decadron [  dexamethasone] Swelling, Other (See Comments)   Stopped by Oncologist        Medication List     STOP taking these medications    benazepril 20 MG tablet Commonly known as: LOTENSIN   carvedilol 3.125 MG  tablet Commonly known as: COREG   furosemide 20 MG tablet Commonly known as: LASIX       TAKE these medications    atorvastatin 40 MG tablet Commonly known as: LIPITOR Take 40 mg by mouth every evening.   diltiazem 120 MG 12 hr capsule Commonly known as: CARDIZEM SR Take 1 capsule (120 mg total) by mouth every 12 (twelve) hours.   escitalopram 10 MG tablet Commonly known as: LEXAPRO Take 10 mg by mouth daily.   ferrous sulfate 325 (65 FE) MG tablet Take 325 mg by mouth 2 (two) times daily with a meal.   glipiZIDE 10 MG tablet Commonly known as: GLUCOTROL Take 10 mg by mouth daily.   levothyroxine 150 MCG tablet Commonly known as: SYNTHROID Take 150 mcg by mouth daily before breakfast.   metoprolol tartrate 25 MG tablet Commonly known as: LOPRESSOR Take 1 tablet (25 mg total) by mouth 2 (two) times daily.   midodrine 5 MG tablet Commonly known as: PROAMATINE Take 1 tablet (5 mg total) by mouth 3 (three) times daily with meals.   pantoprazole 40 MG tablet Commonly known as: PROTONIX Take 40 mg by mouth 2 (two) times daily.   PRESCRIPTION MEDICATION CPAP- At bedtime   Semglee (yfgn) 100 UNIT/ML Solostar Pen Generic drug: insulin glargine Inject 12 Units into the skin every evening. What changed:  how much to take Another medication with the same name was removed. Continue taking this medication, and follow the directions you see here.   tamsulosin 0.4 MG Caps capsule Commonly known as: FLOMAX Take 0.4 mg by mouth daily.        Follow-up Information     Dalton, Kathlene November, MD Follow up in 1 week(s).   Specialty: Internal Medicine        Nahser, Wonda Cheng, MD Follow up in 2 week(s).   Specialty: Cardiology Contact information: [address] [place] [address] [phone]                Allergies  Allergen Reactions   Decadron [Dexamethasone] Swelling and Other (See Comments)    Stopped by Oncologist     Consultations: Cardiology   Procedures/Studies: CT Head Wo Contrast  Result Date: 04/26/2022 CLINICAL DATA:  Head trauma, minor (Age >= 65y) EXAM: CT HEAD WITHOUT CONTRAST TECHNIQUE: Contiguous axial images were obtained from the base of the skull through the vertex without intravenous contrast. RADIATION DOSE REDUCTION: This exam was performed according to the departmental dose-optimization program which includes automated exposure control, adjustment of the mA and/or kV according to patient size and/or use of iterative reconstruction technique. COMPARISON:  CT head February 27, 24. FINDINGS: Brain: Similar hypoattenuation inferior right frontal lobe correlating with abnormal FLAIR signal seen on prior MRI. No evidence of acute large vascular territory infarct, acute hemorrhage, midline shift or hydrocephalus. Vascular: No hyperdense vessel identified. Skull: No acute fracture. Sinuses/Orbits: Paranasal sinus mucosal thickening and partial opacification. No acute orbital findings. Other: No mastoid effusions. IMPRESSION: 1. Similar hypoattenuation inferior right frontal lobe correlating with abnormal FLAIR signal seen on prior MRI. A repeat MRI could allow for more sensitive/direct comparison if clinically warranted. 2. No evidence of new/interval acute abnormality. Electronically Signed   By: Roslynn Amble  Ronnald Ramp M.D.   On: 04/26/2022 14:38   DG Knee 2 Views Left  Result Date: 04/26/2022 CLINICAL DATA:  Syncope. EXAM: LEFT KNEE - 1-2 VIEW COMPARISON:  None Available. FINDINGS: Minimal medial compartment joint space narrowing. Mild chronic enthesopathic change at the quadriceps insertion on patella. No joint effusion. No acute fracture is seen. No dislocation. Moderate vascular calcifications. Lateral thigh and knee subcutaneous fat edema appears similar to the contralateral side imaged the same day, likely systemic. IMPRESSION: Minimal medial compartment osteoarthritis.  No acute fracture.  Electronically Signed   By: Yvonne Kendall M.D.   On: 04/26/2022 14:34   DG Knee 2 Views Right  Result Date: 04/26/2022 CLINICAL DATA:  Syncope. EXAM: RIGHT KNEE - 1-2 VIEW COMPARISON:  None available FINDINGS: Minimal chronic enthesopathic change at the quadriceps insertion on the patella. There is a 3 mm separated ossicle at the distal tip of the lateral tibial spine, age indeterminate for the sequela of remote trauma versus recent injury. Minimal superior and inferior patellar degenerative osteophytes. Minimal medial compartment joint space narrowing. Minimal medial and lateral compartment chondrocalcinosis. Lateral thigh and knee subcutaneous fat edema appears similar to the contralateral side imaged the same day, likely systemic. IMPRESSION: 1. 3 mm separated ossicle at the distal tip of the lateral tibial spine, age indeterminate for the sequela of remote trauma versus recent injury. Otherwise, no acute fracture is seen. 2. Minimal medial and lateral compartment osteoarthritis. Electronically Signed   By: Yvonne Kendall M.D.   On: 04/26/2022 14:32   DG Chest Port 1 View  Result Date: 04/26/2022 CLINICAL DATA:  Hypotension EXAM: PORTABLE CHEST 1 VIEW COMPARISON:  Chest radiograph dated 04/05/2022 FINDINGS: Normal lung volumes. Left basilar patchy and linear opacities. No pleural effusion or pneumothorax. Similar cardiomediastinal silhouette. The visualized skeletal structures are unremarkable. IMPRESSION: Left basilar patchy and linear opacities, likely atelectasis. Aspiration or pneumonia can be considered in the appropriate clinical setting. Electronically Signed   By: Darrin Nipper M.D.   On: 04/26/2022 14:30   ECHOCARDIOGRAM COMPLETE  Result Date: 04/06/2022    ECHOCARDIOGRAM REPORT   Patient Name:   Patrick Rocha [facility] Date of Exam: 04/06/2022 Medical Rec #:  [id number]               Height:       69.0 in Accession #:    [id number]              Weight:       280.0 lb Date of Birth:  1945-10-04               BSA:          2.384 m Patient Age:    77 years                BP:           124/72 mmHg Patient Gender: M                       HR:           95 bpm. Exam Location:  Inpatient Procedure: 2D Echo, Cardiac Doppler and Color Doppler Indications:    Syncope  History:        Patient has no prior history of Echocardiogram examinations.                 Arrythmias:Atrial Fibrillation; Risk Factors:Hypertension.  Sonographer:    Phineas Douglas Referring Phys: [id number] Dawn T TU IMPRESSIONS  1.  Left ventricular ejection fraction, by estimation, is 55 to 60%. The left ventricle has normal function. The left ventricle has no regional wall motion abnormalities. There is mild concentric left ventricular hypertrophy. Left ventricular diastolic parameters are indeterminate.  2. Right ventricular systolic function is normal. The right ventricular size is normal.  3. Left atrial size was mildly dilated.  4. Right atrial size was mildly dilated.  5. The mitral valve is normal in structure. Trivial mitral valve regurgitation. No evidence of mitral stenosis.  6. The aortic valve is tricuspid. There is mild calcification of the aortic valve. Aortic valve regurgitation is trivial. No aortic stenosis is present.  7. The inferior vena cava is dilated in size with >50% respiratory variability, suggesting right atrial pressure of 8 mmHg. FINDINGS  Left Ventricle: Left ventricular ejection fraction, by estimation, is 55 to 60%. The left ventricle has normal function. The left ventricle has no regional wall motion abnormalities. The left ventricular internal cavity size was normal in size. There is  mild concentric left ventricular hypertrophy. Left ventricular diastolic parameters are indeterminate. Right Ventricle: The right ventricular size is normal. No increase in right ventricular wall thickness. Right ventricular systolic function is normal. Left Atrium: Left atrial size was mildly dilated. Right Atrium: Right atrial size was  mildly dilated. Pericardium: There is no evidence of pericardial effusion. Mitral Valve: The mitral valve is normal in structure. Trivial mitral valve regurgitation. No evidence of mitral valve stenosis. Tricuspid Valve: The tricuspid valve is normal in structure. Tricuspid valve regurgitation is trivial. No evidence of tricuspid stenosis. Aortic Valve: The aortic valve is tricuspid. There is mild calcification of the aortic valve. Aortic valve regurgitation is trivial. No aortic stenosis is present. Pulmonic Valve: The pulmonic valve was normal in structure. Pulmonic valve regurgitation is not visualized. No evidence of pulmonic stenosis. Aorta: The aortic root is normal in size and structure. Venous: The inferior vena cava is dilated in size with greater than 50% respiratory variability, suggesting right atrial pressure of 8 mmHg. IAS/Shunts: No atrial level shunt detected by color flow Doppler.  LEFT VENTRICLE PLAX 2D LVIDd:         4.50 cm      Diastology LVIDs:         2.80 cm      LV e' medial:    7.83 cm/s LV PW:         1.30 cm      LV E/e' medial:  8.8 LV IVS:        1.20 cm      LV e' lateral:   12.20 cm/s LVOT diam:     2.20 cm      LV E/e' lateral: 5.6 LV SV:         54 LV SV Index:   22 LVOT Area:     3.80 cm  LV Volumes (MOD) LV vol d, MOD A2C: 133.0 ml LV vol d, MOD A4C: 130.0 ml LV vol s, MOD A2C: 54.3 ml LV vol s, MOD A4C: 56.8 ml LV SV MOD A2C:     78.7 ml LV SV MOD A4C:     130.0 ml LV SV MOD BP:      76.0 ml RIGHT VENTRICLE             IVC RV Basal diam:  4.10 cm     IVC diam: 2.20 cm RV S prime:     12.50 cm/s TAPSE (M-mode): 1.5 cm LEFT ATRIUM  Index        RIGHT ATRIUM           Index LA diam:        4.30 cm 1.80 cm/m   RA Area:     19.10 cm LA Vol (A2C):   86.8 ml 36.42 ml/m  RA Volume:   46.30 ml  19.42 ml/m LA Vol (A4C):   81.3 ml 34.11 ml/m LA Biplane Vol: 85.9 ml 36.04 ml/m  AORTIC VALVE LVOT Vmax:   75.50 cm/s LVOT Vmean:  50.000 cm/s LVOT VTI:    0.141 m  AORTA Ao Root  diam: 3.20 cm Ao Asc diam:  3.70 cm MITRAL VALVE MV Area (PHT): 4.04 cm    SHUNTS MV Decel Time: 188 msec    Systemic VTI:  0.14 m MV E velocity: 68.60 cm/s  Systemic Diam: 2.20 cm Glori Bickers MD Electronically signed by Glori Bickers MD Signature Date/Time: 04/06/2022/4:35:19 PM    Final    CT Head Wo Contrast  Result Date: 04/05/2022 CLINICAL DATA:  Altered mental status, presyncope, status post brain biopsy 1 week prior. EXAM: CT HEAD WITHOUT CONTRAST TECHNIQUE: Contiguous axial images were obtained from the base of the skull through the vertex without intravenous contrast. RADIATION DOSE REDUCTION: This exam was performed according to the departmental dose-optimization program which includes automated exposure control, adjustment of the mA and/or kV according to patient size and/or use of iterative reconstruction technique. COMPARISON:  03/29/2022 FINDINGS: Brain: Moderate parenchymal volume loss is again seen, stable since prior examination. 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 Punctate foci of pneumocephalus related to biopsy on 03/29/2022 have resolved in the interval. No superimposed acute intracranial hemorrhage. No abnormal mass effect or midline shift. Mild periventricular white matter changes are present likely reflecting the sequela of small vessel ischemia. Ventricular size is normal. Cerebellum is unremarkable. No acute infarct Vascular: No hyperdense vessel or unexpected calcification. Skull: Normal. Negative for fracture or focal lesion. Sinuses/Orbits: Extensive mucosal thickening is seen throughout the visualized paranasal sinuses with near complete opacification of the visualized left maxillary sinus and layering fluid within the right maxillary sinus. This appears mildly progressive since prior examination. Orbits are unremarkable. Other:  Mastoid air cells and middle ear cavities are clear. Surgical skin staples noted within the right frontal scalp overlying craniotomy defect. IMPRESSION: 1. 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 No superimposed acute intracranial hemorrhage. No abnormal mass effect or midline shift. 2. Progressive paranasal sinus disease. Electronically Signed   By: Fidela Salisbury M.D.   On: 04/05/2022 22:18   DG Chest Portable 1 View  Result Date: 04/05/2022 CLINICAL DATA:  Syncope. EXAM: PORTABLE CHEST 1 VIEW COMPARISON:  February 27, 2022. FINDINGS: The heart size and mediastinal contours are within normal limits. Both lungs are clear. The visualized skeletal structures are unremarkable. IMPRESSION: No active disease. Electronically Signed   By: Marijo Conception M.D.   On: 04/05/2022 18:07     Subjective: Patient was seen and examined at bedside.  Overnight events noted.   Patient reports doing much better and wants to be discharged.   Patient being discharged to skilled nursing facility for rehab.  Discharge Exam: Vitals:   05/04/22 0517 05/04/22 0916  BP: 134/79 110/63  Pulse: (!) 102 100  Resp: 17  Temp: 97.7 F (36.5 C)   SpO2: 91%    Vitals:   05/03/22 1942 05/03/22 1943 05/04/22 0517 05/04/22 0916  BP:  126/83 134/79 110/63  Pulse: 98 96 (!) 102 100  Resp: 18  17   Temp: 97.6 F (36.4 C)  97.7 F (36.5 C)   TempSrc: Oral  Oral   SpO2: 95% 96% 91%   Weight:      Height:        General: Pt is alert, awake, not in acute distress Cardiovascular: RRR, S1/S2 +, no rubs, no gallops Respiratory: CTA bilaterally, no wheezing, no rhonchi Abdominal: Soft, NT, ND, bowel sounds + Extremities: no edema, no cyanosis    The results of significant diagnostics from this hospitalization (including imaging, microbiology, ancillary and laboratory) are listed below for  reference.     Microbiology: Recent Results (from the past 240 hour(s))  MRSA Next Gen by PCR, Nasal     Status: None   Collection Time: 04/26/22  6:20 PM   Specimen: Nasal Mucosa; Nasal Swab  Result Value Ref Range Status   MRSA by PCR Next Gen NOT DETECTED NOT DETECTED Final    Comment: (NOTE) The GeneXpert MRSA Assay (FDA approved for NASAL specimens only), is one component of a comprehensive MRSA colonization surveillance program. It is not intended to diagnose MRSA infection nor to guide or monitor treatment for MRSA infections. Test performance is not FDA approved in patients less than 34 years old. Performed at [facility], [place] [address], [place],  [address]      Labs: BNP (last 3 results) Recent Labs    01/28/22 2145 04/05/22 1834 04/26/22 1410  BNP 146.0* 110.4* [id number]*   Basic Metabolic Panel: Recent Labs  Lab 04/28/22 0252 04/29/22 0557 04/30/22 0542 05/01/22 0500 05/02/22 0455 05/03/22 0548 05/04/22 0605  NA 135   < > 138 136 136 138 137  K 3.2*   < > 4.1 4.1 4.1 4.6 3.8  CL 101   < > 105 104 102 105 104  CO2 27   < > 24 25 25 23 25   GLUCOSE 254*   < > 107* 170* 183* 153* 103*  BUN 24*   < > 20 20 20 18 16   CREATININE 1.83*   < > 1.69* 1.63* 1.73* 1.71* 1.69*  CALCIUM 6.9*   < > 7.7* 7.8* 7.7* 8.0* 8.0*  MG 2.5*  --   --   --   --   --   --    < > = values in this interval not displayed.   Liver Function Tests: No results for input(s): "AST", "ALT", "ALKPHOS", "BILITOT", "PROT", "ALBUMIN" in the last 168 hours. No results for input(s): "LIPASE", "AMYLASE" in the last 168 hours. No results for input(s): "AMMONIA" in the last 168 hours. CBC: Recent Labs  Lab 04/30/22 0542 05/01/22 0500 05/02/22 0455 05/03/22 0548 05/04/22 0605  WBC 7.7 6.5 6.1 6.4 5.6  HGB 8.0* 7.6* 8.1* 8.3* 8.4*  HCT 26.0* 24.0* 25.4* 26.9* 27.1*  MCV 101.6* 99.6 98.4 101.5* 101.5*  PLT 419* 362 335 312 322   Cardiac Enzymes: No results for  input(s): "CKTOTAL", "CKMB", "CKMBINDEX", "TROPONINI" in the last 168 hours. BNP: Invalid input(s): "POCBNP" CBG: Recent Labs  Lab 05/03/22 0749 05/03/22 1157 05/03/22 1735 05/03/22 2136 05/04/22 0715  GLUCAP 128* 97 217* 101* 109*   D-Dimer No results for input(s): "DDIMER" in the last 72 hours. Hgb A1c No results for input(s): "HGBA1C" in the  last 72 hours. Lipid Profile No results for input(s): "CHOL", "HDL", "LDLCALC", "TRIG", "CHOLHDL", "LDLDIRECT" in the last 72 hours. Thyroid function studies No results for input(s): "TSH", "T4TOTAL", "T3FREE", "THYROIDAB" in the last 72 hours.  Invalid input(s): "FREET3" Anemia work up No results for input(s): "VITAMINB12", "FOLATE", "FERRITIN", "TIBC", "IRON", "RETICCTPCT" in the last 72 hours. Urinalysis    Component Value Date/Time   COLORURINE YELLOW 04/29/2022 0530   APPEARANCEUR CLOUDY (A) 04/29/2022 0530   LABSPEC 1.026 04/29/2022 0530   PHURINE 5.0 04/29/2022 0530   GLUCOSEU 50 (A) 04/29/2022 0530   HGBUR LARGE (A) 04/29/2022 0530   BILIRUBINUR NEGATIVE 04/29/2022 0530   KETONESUR NEGATIVE 04/29/2022 0530   PROTEINUR 100 (A) 04/29/2022 0530   NITRITE NEGATIVE 04/29/2022 0530   LEUKOCYTESUR NEGATIVE 04/29/2022 0530   Sepsis Labs Recent Labs  Lab 05/01/22 0500 05/02/22 0455 05/03/22 0548 05/04/22 0605  WBC 6.5 6.1 6.4 5.6   Microbiology Recent Results (from the past 240 hour(s))  MRSA Next Gen by PCR, Nasal     Status: None   Collection Time: 04/26/22  6:20 PM   Specimen: Nasal Mucosa; Nasal Swab  Result Value Ref Range Status   MRSA by PCR Next Gen NOT DETECTED NOT DETECTED Final    Comment: (NOTE) The GeneXpert MRSA Assay (FDA approved for NASAL specimens only), is one component of a comprehensive MRSA colonization surveillance program. It is not intended to diagnose MRSA infection nor to guide or monitor treatment for MRSA infections. Test performance is not FDA approved in patients less than 63  years old. Performed at [facility], [place] [address], [place], [place] [address]      Time coordinating discharge: Over 30 minutes  SIGNED:   Duard Brady, MD  [facility] 05/04/2022, 10:39 AM Pager   If 7PM-7AM, please contact night-coverage

## 2022-05-04 NOTE — TOC Transition Note (Signed)
Transition of Care [facility]) - CM/SW Discharge Note   Patient Details  Name: Patrick Rocha MRN: [id number] Date of Birth: October 12, 1945  Transition of Care [facility]) CM/SW Contact:  Vassie Moselle, LCSW Phone Number: 05/04/2022, 10:16 AM   Clinical Narrative:    Pt to discharge to [facility]. Pt will be receiving hospice services through [place]. Pt will be going to room 19. RN to call report to [phone].  CSW spoke with pt's DIL and confirmed discharge plans. Pt's DIL declines palliative care w/ home health and continues to endorse plan for hospice services at discharge.  Medications to be sent to [facility] in [place].  FL-2 and DC summary emailed to [place] and placed in discharge packet.  PTAR called for transportation at 10:53am..   Final next level of care: Assisted Living (w/ Hospice) Barriers to Discharge: Barriers Resolved   Patient Goals and CMS Choice CMS Medicare.gov Compare Post Acute Care list provided to:: Patient Represenative (must comment) Choice offered to / list presented to : Adult Children, HC POA / [place]  Discharge Placement                Patient chooses bed at: Other - please specify in the comment section below: [facility] Age) Patient to be transferred to facility by: [place] Name of family member notified: DIL Patient and family notified of of transfer: 05/04/22  Discharge Plan and Services Additional resources added to the After Visit Summary for   In-house Referral: NA Discharge Planning Services: CM Consult Post Acute Care Choice: Resumption of Svcs/PTA Provider          DME Arranged: N/A DME Agency: NA       HH Arranged: NA HH Agency: NA        Social Determinants of Health (SDOH) Interventions SDOH Screenings   Food Insecurity: No Food Insecurity (04/27/2022)  Housing: High Risk (04/27/2022)  Transportation Needs: Unmet Transportation Needs (04/27/2022)  Utilities: Not At Risk  (04/27/2022)  Tobacco Use: Medium Risk (04/26/2022)     Readmission Risk Interventions    04/28/2022    2:44 PM  Readmission Risk Prevention Plan  Transportation Screening Complete  PCP or Specialist Appt within 3-5 Days Complete  HRI or [place] Complete  Social Work Consult for [place] Planning/Counseling Complete  Palliative Care Screening Complete  Medication Review Press photographer) Complete

## 2022-05-05 ENCOUNTER — Emergency Department (HOSPITAL_COMMUNITY)

## 2022-05-05 ENCOUNTER — Emergency Department (HOSPITAL_COMMUNITY)
Admission: EM | Admit: 2022-05-05 | Discharge: 2022-05-06 | Disposition: A | Discharge status: Skilled Nursing Facility | Attending: Emergency Medicine | Admitting: Emergency Medicine

## 2022-05-05 ENCOUNTER — Other Ambulatory Visit: Payer: Self-pay

## 2022-05-05 ENCOUNTER — Encounter (HOSPITAL_COMMUNITY): Payer: Self-pay

## 2022-05-05 DIAGNOSIS — I10 Essential (primary) hypertension: Secondary | ICD-10-CM | POA: Insufficient documentation

## 2022-05-05 DIAGNOSIS — R008 Other abnormalities of heart beat: Secondary | ICD-10-CM | POA: Insufficient documentation

## 2022-05-05 DIAGNOSIS — Z85841 Personal history of malignant neoplasm of brain: Secondary | ICD-10-CM | POA: Diagnosis not present

## 2022-05-05 DIAGNOSIS — Z20822 Contact with and (suspected) exposure to covid-19: Secondary | ICD-10-CM | POA: Diagnosis not present

## 2022-05-05 DIAGNOSIS — Z7984 Long term (current) use of oral hypoglycemic drugs: Secondary | ICD-10-CM | POA: Diagnosis not present

## 2022-05-05 DIAGNOSIS — R739 Hyperglycemia, unspecified: Secondary | ICD-10-CM

## 2022-05-05 DIAGNOSIS — R000 Tachycardia, unspecified: Secondary | ICD-10-CM | POA: Diagnosis not present

## 2022-05-05 DIAGNOSIS — J449 Chronic obstructive pulmonary disease, unspecified: Secondary | ICD-10-CM | POA: Diagnosis not present

## 2022-05-05 DIAGNOSIS — E1165 Type 2 diabetes mellitus with hyperglycemia: Secondary | ICD-10-CM | POA: Insufficient documentation

## 2022-05-05 DIAGNOSIS — W01198A Fall on same level from slipping, tripping and stumbling with subsequent striking against other object, initial encounter: Secondary | ICD-10-CM | POA: Insufficient documentation

## 2022-05-05 DIAGNOSIS — R0602 Shortness of breath: Secondary | ICD-10-CM | POA: Insufficient documentation

## 2022-05-05 DIAGNOSIS — Z79899 Other long term (current) drug therapy: Secondary | ICD-10-CM | POA: Diagnosis not present

## 2022-05-05 DIAGNOSIS — Y92128 Other place in nursing home as the place of occurrence of the external cause: Secondary | ICD-10-CM | POA: Diagnosis not present

## 2022-05-05 DIAGNOSIS — M542 Cervicalgia: Secondary | ICD-10-CM | POA: Insufficient documentation

## 2022-05-05 DIAGNOSIS — W19XXXA Unspecified fall, initial encounter: Secondary | ICD-10-CM

## 2022-05-05 DIAGNOSIS — R519 Headache, unspecified: Secondary | ICD-10-CM | POA: Insufficient documentation

## 2022-05-05 DIAGNOSIS — Z794 Long term (current) use of insulin: Secondary | ICD-10-CM | POA: Diagnosis not present

## 2022-05-05 LAB — BLOOD GAS, VENOUS
Acid-Base Excess: 2 mmol/L (ref 0.0–2.0)
Bicarbonate: 27.3 mmol/L (ref 20.0–28.0)
O2 Saturation: 37 %
Patient temperature: 37
pCO2, Ven: 44 mmHg (ref 44–60)
pH, Ven: 7.4 (ref 7.25–7.43)
pO2, Ven: <31 mmHg — CL (ref 32–45)

## 2022-05-05 LAB — CBC
HCT: 26.4 % — ABNORMAL LOW (ref 39.0–52.0)
Hemoglobin: 8.2 g/dL — ABNORMAL LOW (ref 13.0–17.0)
MCH: 31.2 pg (ref 26.0–34.0)
MCHC: 31.1 g/dL (ref 30.0–36.0)
MCV: 100.4 fL — ABNORMAL HIGH (ref 80.0–100.0)
Platelets: 332 10*3/uL (ref 150–400)
RBC: 2.63 MIL/uL — ABNORMAL LOW (ref 4.22–5.81)
RDW: 16.5 % — ABNORMAL HIGH (ref 11.5–15.5)
WBC: 6.1 10*3/uL (ref 4.0–10.5)
nRBC: 0.3 % — ABNORMAL HIGH (ref 0.0–0.2)

## 2022-05-05 LAB — BASIC METABOLIC PANEL
Anion gap: 8 (ref 5–15)
BUN: 19 mg/dL (ref 8–23)
CO2: 25 mmol/L (ref 22–32)
Calcium: 7.9 mg/dL — ABNORMAL LOW (ref 8.9–10.3)
Chloride: 102 mmol/L (ref 98–111)
Creatinine, Ser: 1.94 mg/dL — ABNORMAL HIGH (ref 0.61–1.24)
GFR, Estimated: 35 mL/min — ABNORMAL LOW (ref >60)
Glucose, Bld: 348 mg/dL — ABNORMAL HIGH (ref 70–99)
Potassium: 4.1 mmol/L (ref 3.5–5.1)
Sodium: 135 mmol/L (ref 135–145)

## 2022-05-05 LAB — RESP PANEL BY RT-PCR (RSV, FLU A&B, COVID)  RVPGX2
Influenza A by PCR: NEGATIVE
Influenza B by PCR: NEGATIVE
Resp Syncytial Virus by PCR: NEGATIVE
SARS Coronavirus 2 by RT PCR: NEGATIVE

## 2022-05-05 LAB — TROPONIN I (HIGH SENSITIVITY)
Troponin I (High Sensitivity): 19 ng/L — ABNORMAL HIGH (ref <18)
Troponin I (High Sensitivity): 21 ng/L — ABNORMAL HIGH (ref <18)

## 2022-05-05 LAB — BETA-HYDROXYBUTYRIC ACID: Beta-Hydroxybutyric Acid: 0.08 mmol/L (ref 0.05–0.27)

## 2022-05-05 MED ORDER — INSULIN ASPART 100 UNIT/ML IJ SOLN
8.0000 [IU] | Freq: Once | INTRAMUSCULAR | Status: AC
  Administered 2022-05-05: 8 [IU] via INTRAVENOUS
  Filled 2022-05-05: qty 0.08

## 2022-05-05 MED ORDER — SODIUM CHLORIDE 0.9 % IV BOLUS
1000.0000 mL | Freq: Once | INTRAVENOUS | Status: AC
  Administered 2022-05-05: 1000 mL via INTRAVENOUS

## 2022-05-05 MED ORDER — MORPHINE SULFATE (PF) 4 MG/ML IV SOLN
4.0000 mg | Freq: Once | INTRAVENOUS | Status: AC
  Administered 2022-05-05: 4 mg via INTRAVENOUS
  Filled 2022-05-05: qty 1

## 2022-05-05 NOTE — ED Notes (Signed)
Pt screaming and stating his right eye is burning. Darrick Meigs, [place] notified

## 2022-05-05 NOTE — ED Notes (Signed)
Pt placed on 2L [place] for RA O2 of 86%

## 2022-05-05 NOTE — ED Provider Notes (Signed)
[facility] Provider Note   CSN: [id number] Arrival date & time: 05/05/22  2020     History  Chief Complaint  Patient presents with   Patrick Rocha Patrick Rocha is a 77 y.o. male with past medical history significant for brain cancer, diabetes, COPD, obesity, persistent A-fib, he is not currently taking any anticoagulation secondary to his brain mass, and frequent falls, who presents after mechanical fall just prior to arrival, patient hit the back of his head, he denies loss of consciousness, he reports that he had discontinued his blood thinners just a week ago.  Patient is unsure what made him fall.  He has been short of breath with exertion.  He reports he has felt somewhat chilled, he said he has headache which is not different than his baseline, known brain cancer, he denies any feeling of heart racing, chest pain compared to baseline, but does have a history of persistent A-fib, rate between 70 and 140 per EMS.  CBG was 438, patient reports normally well-controlled diabetes.   Fall       Home Medications Prior to Admission medications   Medication Sig Start Date End Date Taking? Authorizing Provider  atorvastatin (LIPITOR) 40 MG tablet Take 40 mg by mouth every evening.   Yes [provider]  diltiazem (CARDIZEM) 120 MG tablet Take 120 mg by mouth every 12 (twelve) hours.   Yes [provider]  escitalopram (LEXAPRO) 10 MG tablet Take 10 mg by mouth daily.   Yes [provider]  ferrous sulfate 325 (65 FE) MG tablet Take 325 mg by mouth 2 (two) times daily with a meal.   Yes [provider]  glipiZIDE (GLUCOTROL) 10 MG tablet Take 10 mg by mouth daily.   Yes [provider]  insulin glargine (SEMGLEE, YFGN,) 100 UNIT/ML Solostar Pen Inject 12 Units into the skin every evening. 05/04/22  Yes Duard Brady, MD  levothyroxine (SYNTHROID, LEVOTHROID) 150 MCG tablet Take 150 mcg by  mouth daily before breakfast.   Yes [provider]  metoprolol tartrate (LOPRESSOR) 25 MG tablet Take 1 tablet (25 mg total) by mouth 2 (two) times daily. 05/04/22  Yes Duard Brady, MD  midodrine (PROAMATINE) 5 MG tablet Take 1 tablet (5 mg total) by mouth 3 (three) times daily with meals. 05/04/22  Yes Duard Brady, MD  pantoprazole (PROTONIX) 40 MG tablet Take 40 mg by mouth 2 (two) times daily.   Yes [provider]  tamsulosin (FLOMAX) 0.4 MG CAPS capsule Take 0.4 mg by mouth daily.   Yes [provider]  diltiazem (CARDIZEM SR) 120 MG 12 hr capsule Take 1 capsule (120 mg total) by mouth every 12 (twelve) hours. Patient not taking: Reported on 05/05/2022 05/04/22   Duard Brady, MD  PRESCRIPTION MEDICATION CPAP- At bedtime    [provider]      Allergies    Decadron [dexamethasone]    Review of Systems   Review of Systems  All other systems reviewed and are negative.   Physical Exam Updated Vital Signs BP 135/83   Pulse (!) 102   Temp 97.7 F (36.5 C) (Oral)   Resp 16   Ht 5\' 9"  (1.753 m)   Wt 120.2 kg   SpO2 97%   BMI 39.13 kg/m  Physical Exam Vitals and nursing note reviewed.  Constitutional:      General: He is not in acute distress.    Appearance: Normal appearance.  Comments: Chronically ill-appearing, no acute distress at time my evaluation  HENT:     Head: Normocephalic and atraumatic.  Eyes:     General:        Right eye: No discharge.        Left eye: No discharge.  Cardiovascular:     Rate and Rhythm: Tachycardia present. Rhythm irregular.     Heart sounds: No murmur heard.    No friction rub. No gallop.     Comments: Patient with persistent A-fib, intermittent normal rate, occasional tachycardia Pulmonary:     Effort: Pulmonary effort is normal.     Breath sounds: Normal breath sounds.     Comments: No wheezing, rhonchi, stridor, rales, no respiratory distress Abdominal:     General: Bowel sounds are  normal.     Palpations: Abdomen is soft.  Skin:    General: Skin is warm and dry.     Capillary Refill: Capillary refill takes less than 2 seconds.  Neurological:     Mental Status: He is alert and oriented to person, place, and time.  Psychiatric:        Mood and Affect: Mood normal.        Behavior: Behavior normal.     ED Results / Procedures / Treatments   Labs (all labs ordered are listed, but only abnormal results are displayed) Labs Reviewed  CBC - Abnormal; Notable for the following components:      Result Value   RBC 2.63 (*)    Hemoglobin 8.2 (*)    HCT 26.4 (*)    MCV 100.4 (*)    RDW 16.5 (*)    nRBC 0.3 (*)    All other components within normal limits  BASIC METABOLIC PANEL - Abnormal; Notable for the following components:   Glucose, Bld 348 (*)    Creatinine, Ser 1.94 (*)    Calcium 7.9 (*)    GFR, Estimated 35 (*)    All other components within normal limits  BLOOD GAS, VENOUS - Abnormal; Notable for the following components:   pO2, Ven <31 (*)    All other components within normal limits  TROPONIN I (HIGH SENSITIVITY) - Abnormal; Notable for the following components:   Troponin I (High Sensitivity) 19 (*)    All other components within normal limits  TROPONIN I (HIGH SENSITIVITY) - Abnormal; Notable for the following components:   Troponin I (High Sensitivity) 21 (*)    All other components within normal limits  RESP PANEL BY RT-PCR (RSV, FLU A&B, COVID)  RVPGX2  BETA-HYDROXYBUTYRIC ACID  CBG MONITORING, ED    EKG EKG Interpretation  Date/Time:  Thursday May 05 2022 20:43:52 EDT Ventricular Rate:  102 PR Interval:    QRS Duration: 132 QT Interval:  349 QTC Calculation: 455 R Axis:   7 Text Interpretation: Atrial fibrillation Ventricular premature complex Right bundle branch block Since last tracing rate slower Confirmed by Isla Pence [phone]) on 05/05/2022 8:53:35 PM  Radiology CT Head Wo Contrast  Result Date: 05/05/2022 CLINICAL  DATA:  Head and neck trauma, fall. EXAM: CT HEAD WITHOUT CONTRAST CT CERVICAL SPINE WITHOUT CONTRAST TECHNIQUE: Multidetector CT imaging of the head and cervical spine was performed following the standard protocol without intravenous contrast. Multiplanar CT image reconstructions of the cervical spine were also generated. RADIATION DOSE REDUCTION: This exam was performed according to the departmental dose-optimization program which includes automated exposure control, adjustment of the mA and/or kV according to patient size and/or use of iterative reconstruction  technique. COMPARISON:  04/26/2022, 01/29/2022. FINDINGS: CT HEAD FINDINGS Brain: No acute intracranial hemorrhage, midline shift or mass effect. No extra-axial fluid collection. Mild diffuse atrophy is noted. Periventricular white matter hypodensities are present bilaterally. No hydrocephalus. A stable hypodense region is seen in the inferior aspect of the frontal lobe on the right extending into the temporal lobe on the right, also seen on prior MRI Vascular: No hyperdense vessel or unexpected calcification. Skull: No acute fracture. Sinuses/Orbits: Mucosal thickening is noted in the paranasal sinuses bilaterally with mucoperiosteal thickening. No acute orbital abnormality. Other: None. CT CERVICAL SPINE FINDINGS Alignment: There is minimal anterolisthesis at C3-C4 and C4-C5. Skull base and vertebrae: No acute fracture. Soft tissues and spinal canal: No prevertebral fluid or swelling. No visible canal hematoma. Disc levels: Intervertebral disc space narrowing, uncovertebral osteophyte formation and facet arthropathy, most pronounced at C5-C6 and C6-C7. Upper chest: Patchy airspace disease is present in the right upper lobe. Other: Carotid artery calcifications are noted. IMPRESSION: 1. No acute intracranial hemorrhage. 2. Chronic microvascular ischemic changes. 3. Stable hypodense region in the inferior frontal lobe on the right extending into the insula  on the right, suspicious for infiltrating mass as seen on prior MRI. 4. Degenerative changes in the cervical spine without evidence of acute fracture. Electronically Signed   By: Brett Fairy M.D.   On: 05/05/2022 21:42   CT Cervical Spine Wo Contrast  Result Date: 05/05/2022 CLINICAL DATA:  Head and neck trauma, fall. EXAM: CT HEAD WITHOUT CONTRAST CT CERVICAL SPINE WITHOUT CONTRAST TECHNIQUE: Multidetector CT imaging of the head and cervical spine was performed following the standard protocol without intravenous contrast. Multiplanar CT image reconstructions of the cervical spine were also generated. RADIATION DOSE REDUCTION: This exam was performed according to the departmental dose-optimization program which includes automated exposure control, adjustment of the mA and/or kV according to patient size and/or use of iterative reconstruction technique. COMPARISON:  04/26/2022, 01/29/2022. FINDINGS: CT HEAD FINDINGS Brain: No acute intracranial hemorrhage, midline shift or mass effect. No extra-axial fluid collection. Mild diffuse atrophy is noted. Periventricular white matter hypodensities are present bilaterally. No hydrocephalus. A stable hypodense region is seen in the inferior aspect of the frontal lobe on the right extending into the temporal lobe on the right, also seen on prior MRI Vascular: No hyperdense vessel or unexpected calcification. Skull: No acute fracture. Sinuses/Orbits: Mucosal thickening is noted in the paranasal sinuses bilaterally with mucoperiosteal thickening. No acute orbital abnormality. Other: None. CT CERVICAL SPINE FINDINGS Alignment: There is minimal anterolisthesis at C3-C4 and C4-C5. Skull base and vertebrae: No acute fracture. Soft tissues and spinal canal: No prevertebral fluid or swelling. No visible canal hematoma. Disc levels: Intervertebral disc space narrowing, uncovertebral osteophyte formation and facet arthropathy, most pronounced at C5-C6 and C6-C7. Upper chest:  Patchy airspace disease is present in the right upper lobe. Other: Carotid artery calcifications are noted. IMPRESSION: 1. No acute intracranial hemorrhage. 2. Chronic microvascular ischemic changes. 3. Stable hypodense region in the inferior frontal lobe on the right extending into the insula on the right, suspicious for infiltrating mass as seen on prior MRI. 4. Degenerative changes in the cervical spine without evidence of acute fracture. Electronically Signed   By: Brett Fairy M.D.   On: 05/05/2022 21:42   DG Chest Portable 1 View  Result Date: 05/05/2022 CLINICAL DATA:  Shortness of breath and recent fall, initial encounter EXAM: PORTABLE CHEST 1 VIEW COMPARISON:  03/28/2022 FINDINGS: Cardiac shadow is stable. Lungs are well aerated bilaterally. Diffuse vascular  congestion is noted with patchy opacities likely representing edema. No bony abnormality is noted. IMPRESSION: Mild CHF. Electronically Signed   By: Inez Catalina M.D.   On: 05/05/2022 21:06   DG Hips Bilat W or Wo Pelvis 2 Views  Result Date: 05/05/2022 CLINICAL DATA:  Recent fall with pelvic pain, initial encounter EXAM: DG HIP (WITH OR WITHOUT PELVIS) 2V BILAT COMPARISON:  None Available. FINDINGS: The pelvic ring is intact. Degenerative changes of the hip joints are noted bilaterally. No acute fracture or dislocation is noted. No soft tissue abnormality is seen. IMPRESSION: Mild degenerative change without acute abnormality. Electronically Signed   By: Inez Catalina M.D.   On: 05/05/2022 21:06    Procedures Procedures    Medications Ordered in ED Medications  morphine (PF) 4 MG/ML injection 4 mg (4 mg Intravenous Given 05/05/22 2112)  sodium chloride 0.9 % bolus 1,000 mL (1,000 mLs Intravenous New Bag/Given 05/05/22 2206)  insulin aspart (novoLOG) injection 8 Units (8 Units Intravenous Given 05/05/22 2207)    ED Course/ Medical Decision Making/ A&P                             Medical Decision Making Amount and/or Complexity of  Data Reviewed Labs: ordered. Radiology: ordered.   This patient is a 77 y.o. male  who presents to the ED for concern of mechanical fall after recent discharge from hospital.   Differential diagnoses prior to evaluation: The emergent differential diagnosis includes, but is not limited to,  acute fracture, dislocation, intracranial injury, metabolic or hepatic encephalopathy, cardiac or neurogenic syncope vs other. This is not an exhaustive differential.   Past Medical History / Co-morbidities: brain cancer, diabetes, COPD, obesity, persistent A-fib, he is not currently taking any anticoagulation secondary to his brain mass, and frequent falls  Additional history: Chart reviewed. Pertinent results include: Reviewed lab work, evaluation from his recent admission to the hospital, he was just discharged today  Physical Exam: Physical exam performed. The pertinent findings include: Patient with chronic ill appearance but no acute deficits, he is in persistent A-fib, with intermittent elevated rate, but mostly less than 110 bpm, he had a brief oxygen desaturation after receiving pain medication but is otherwise been stable on room air.  He has some mild hypertensive episodes of blood pressure 152/79 at max.  He is in no respiratory distress.  No accessory breath sounds.  Lab Tests/Imaging studies: I personally interpreted labs/imaging and the pertinent results include: Initial troponin 19, delta troponin 21, no delta greater than 2, no evidence of acute ACS at this time.  CBC stable compared to baseline with recent discharge from hospital.  His BMP is notable for hyperglycemia, glucose 248, RVP negative for COVID, flu, RSV, normal beta hydroxy, no acidosis on VBG.  Bili interpreted CT head, C-spine, plain film chest, bilateral hips which showed no acute intracranial abnormality, C-spine fracture dislocation.  I agree with the radiologist interpretation.  Cardiac monitoring: EKG obtained and  interpreted by my attending physician which shows: afib   Medications: I ordered medication including morphine, fluids, insulin, overall I think the patient needs to return to his home medications that he can have control of his normal baseline pathologies, blood sugar, unclear cause of his fall today but suspect mechanical fall.  I have reviewed the patients home medicines and have made adjustments as needed.   Disposition: After consideration of the diagnostic results and the patients response to treatment, I feel  that patient is stable for discharge with no acute localizing process or decompensation of his overall health at this time, he had hyperglycemia without evidence of developing DKA, needs to continue his home medications.   emergency department workup does not suggest an emergent condition requiring admission or immediate intervention beyond what has been performed at this time. The plan is: as above. The patient is safe for discharge and has been instructed to return immediately for worsening symptoms, change in symptoms or any other concerns.  Final Clinical Impression(s) / ED Diagnoses Final diagnoses:  Fall, initial encounter  Hyperglycemia    Rx / DC Orders ED Discharge Orders     None         [place] 05/05/22 2340    Jacalyn Lefevre, MD 05/19/22 1113

## 2022-05-05 NOTE — ED Notes (Signed)
Cold compress applied to right eye for discomfort, pt states he is having some relief

## 2022-05-05 NOTE — ED Triage Notes (Signed)
BIB EMS from [facility] for mechanical fall. Pt did hit his head, no LOC. Was previously on blood thinners, stopped a week ago.  C/o headache, no other injuries noted.

## 2022-05-05 NOTE — ED Notes (Signed)
Pt to xray via stretcher

## 2022-05-05 NOTE — ED Notes (Signed)
Pt to ct via stretcher

## 2022-05-05 NOTE — ED Notes (Signed)
Missed call from hospice RN, Mariann Laster, with [facility]. Returned call, RN is going to call back

## 2022-05-06 LAB — CBG MONITORING, ED: Glucose-Capillary: 161 mg/dL — ABNORMAL HIGH (ref 70–99)

## 2022-05-06 NOTE — ED Notes (Signed)
Patient verbalizes understanding of discharge instructions. Opportunity for questioning and answers were provided. Armband removed by staff, pt discharged from ED. Left via [facility] to return back to facility.

## 2022-05-06 NOTE — ED Notes (Signed)
Called dispatch center to request transport for pt back to facility.

## 2022-05-16 ENCOUNTER — Other Ambulatory Visit: Payer: Self-pay | Admitting: Internal Medicine

## 2022-05-17 ENCOUNTER — Telehealth: Payer: Self-pay | Admitting: *Deleted

## 2022-05-19 ENCOUNTER — Telehealth: Payer: Self-pay | Admitting: Internal Medicine

## 2022-05-19 NOTE — Telephone Encounter (Signed)
Patient's daughter in law called to inform us that he has passed away. Forwarding information to RN.

## 2022-05-23 ENCOUNTER — Inpatient Hospital Stay: Payer: Medicare Other | Admitting: Internal Medicine

## 2022-06-08 NOTE — Telephone Encounter (Signed)
PC to patient, no answer, left VM - informed patient Dr Barbaraann Cao ordered a brain MRI which has not been scheduled.  Instructed patient to call Central Scheduling to schedule this scan, [phone].  Also instructed patient to call this office with his MRI date, his appointment with Dr Barbaraann Cao on 05/22/21 may need to be changed if MRI is not complete before. [phone].

## 2022-06-08 DEATH — deceased
